# Patient Record
Sex: Male | Born: 1960 | Race: Black or African American | Hispanic: No | Smoking: Never smoker
Health system: Southern US, Community
[De-identification: ages and names within clinical notes are randomized; demographics above are authoritative.]

## PROBLEM LIST (undated history)

## (undated) DIAGNOSIS — K264 Chronic or unspecified duodenal ulcer with hemorrhage: Secondary | ICD-10-CM

## (undated) DIAGNOSIS — D649 Anemia, unspecified: Secondary | ICD-10-CM

## (undated) DIAGNOSIS — I10 Essential (primary) hypertension: Secondary | ICD-10-CM

## (undated) DIAGNOSIS — D132 Benign neoplasm of duodenum: Secondary | ICD-10-CM

## (undated) DIAGNOSIS — K920 Hematemesis: Secondary | ICD-10-CM

## (undated) DIAGNOSIS — G4730 Sleep apnea, unspecified: Secondary | ICD-10-CM

## (undated) DIAGNOSIS — T8859XA Other complications of anesthesia, initial encounter: Secondary | ICD-10-CM

## (undated) DIAGNOSIS — N289 Disorder of kidney and ureter, unspecified: Secondary | ICD-10-CM

## (undated) HISTORY — PX: CHOLECYSTECTOMY: SHX55

## (undated) HISTORY — PX: ABDOMINAL SURGERY: SHX537

## (undated) HISTORY — DX: Hematemesis: K92.0

## (undated) HISTORY — PX: COLON SURGERY: SHX602

## (undated) HISTORY — DX: Chronic or unspecified duodenal ulcer with hemorrhage: K26.4

## (undated) HISTORY — PX: LAPAROSCOPIC GASTRIC SLEEVE RESECTION: SHX5895

---

## 2008-09-25 ENCOUNTER — Ambulatory Visit: Payer: Self-pay | Admitting: Nephrology

## 2015-03-20 DIAGNOSIS — E875 Hyperkalemia: Secondary | ICD-10-CM | POA: Insufficient documentation

## 2015-03-20 DIAGNOSIS — I10 Essential (primary) hypertension: Secondary | ICD-10-CM | POA: Insufficient documentation

## 2015-03-31 DIAGNOSIS — N041 Nephrotic syndrome with focal and segmental glomerular lesions: Secondary | ICD-10-CM | POA: Insufficient documentation

## 2015-04-02 DIAGNOSIS — E611 Iron deficiency: Secondary | ICD-10-CM | POA: Insufficient documentation

## 2015-04-07 DIAGNOSIS — N2581 Secondary hyperparathyroidism of renal origin: Secondary | ICD-10-CM | POA: Insufficient documentation

## 2015-09-30 DIAGNOSIS — D631 Anemia in chronic kidney disease: Secondary | ICD-10-CM | POA: Insufficient documentation

## 2016-05-16 DIAGNOSIS — N186 End stage renal disease: Secondary | ICD-10-CM | POA: Insufficient documentation

## 2016-05-16 DIAGNOSIS — Z992 Dependence on renal dialysis: Secondary | ICD-10-CM | POA: Insufficient documentation

## 2016-06-10 DIAGNOSIS — IMO0001 Reserved for inherently not codable concepts without codable children: Secondary | ICD-10-CM | POA: Insufficient documentation

## 2016-06-30 ENCOUNTER — Inpatient Hospital Stay
Admission: EM | Admit: 2016-06-30 | Discharge: 2016-07-03 | DRG: 377 | Disposition: A | Discharge status: Home / Self Care | Payer: BLUE CROSS/BLUE SHIELD | Attending: Internal Medicine | Admitting: Internal Medicine

## 2016-06-30 ENCOUNTER — Encounter
Admission: EM | Disposition: A | Discharge status: Home / Self Care | Payer: Self-pay | Source: Home / Self Care | Attending: Internal Medicine

## 2016-06-30 ENCOUNTER — Encounter: Payer: Self-pay | Admitting: Emergency Medicine

## 2016-06-30 DIAGNOSIS — I951 Orthostatic hypotension: Secondary | ICD-10-CM | POA: Diagnosis present

## 2016-06-30 DIAGNOSIS — D62 Acute posthemorrhagic anemia: Secondary | ICD-10-CM | POA: Diagnosis present

## 2016-06-30 DIAGNOSIS — K3189 Other diseases of stomach and duodenum: Secondary | ICD-10-CM | POA: Diagnosis present

## 2016-06-30 DIAGNOSIS — D631 Anemia in chronic kidney disease: Secondary | ICD-10-CM | POA: Diagnosis present

## 2016-06-30 DIAGNOSIS — K922 Gastrointestinal hemorrhage, unspecified: Secondary | ICD-10-CM

## 2016-06-30 DIAGNOSIS — Z992 Dependence on renal dialysis: Secondary | ICD-10-CM

## 2016-06-30 DIAGNOSIS — N186 End stage renal disease: Secondary | ICD-10-CM

## 2016-06-30 DIAGNOSIS — Q613 Polycystic kidney, unspecified: Secondary | ICD-10-CM

## 2016-06-30 DIAGNOSIS — K264 Chronic or unspecified duodenal ulcer with hemorrhage: Principal | ICD-10-CM

## 2016-06-30 DIAGNOSIS — I10 Essential (primary) hypertension: Secondary | ICD-10-CM | POA: Diagnosis present

## 2016-06-30 DIAGNOSIS — D649 Anemia, unspecified: Secondary | ICD-10-CM

## 2016-06-30 DIAGNOSIS — E875 Hyperkalemia: Secondary | ICD-10-CM | POA: Diagnosis present

## 2016-06-30 DIAGNOSIS — Z8249 Family history of ischemic heart disease and other diseases of the circulatory system: Secondary | ICD-10-CM

## 2016-06-30 DIAGNOSIS — K921 Melena: Secondary | ICD-10-CM

## 2016-06-30 DIAGNOSIS — D500 Iron deficiency anemia secondary to blood loss (chronic): Secondary | ICD-10-CM

## 2016-06-30 DIAGNOSIS — Z6841 Body Mass Index (BMI) 40.0 and over, adult: Secondary | ICD-10-CM | POA: Diagnosis not present

## 2016-06-30 DIAGNOSIS — N2581 Secondary hyperparathyroidism of renal origin: Secondary | ICD-10-CM | POA: Diagnosis present

## 2016-06-30 DIAGNOSIS — D691 Qualitative platelet defects: Secondary | ICD-10-CM | POA: Diagnosis present

## 2016-06-30 HISTORY — DX: Gastrointestinal hemorrhage, unspecified: K92.2

## 2016-06-30 HISTORY — DX: Benign neoplasm of duodenum: D13.2

## 2016-06-30 HISTORY — DX: Disorder of kidney and ureter, unspecified: N28.9

## 2016-06-30 HISTORY — PX: DIALYSIS/PERMA CATHETER INSERTION: CATH118288

## 2016-06-30 HISTORY — DX: Essential (primary) hypertension: I10

## 2016-06-30 LAB — CBC
HCT: 19.1 % — ABNORMAL LOW (ref 40.0–52.0)
Hemoglobin: 6.7 g/dL — ABNORMAL LOW (ref 13.0–18.0)
MCH: 30.5 pg (ref 26.0–34.0)
MCHC: 34.9 g/dL (ref 32.0–36.0)
MCV: 87.4 fL (ref 80.0–100.0)
PLATELETS: 262 10*3/uL (ref 150–440)
RBC: 2.19 MIL/uL — ABNORMAL LOW (ref 4.40–5.90)
RDW: 15.6 % — AB (ref 11.5–14.5)
WBC: 7.7 10*3/uL (ref 3.8–10.6)

## 2016-06-30 LAB — COMPREHENSIVE METABOLIC PANEL
ALBUMIN: 3.1 g/dL — AB (ref 3.5–5.0)
ALT: 14 U/L — AB (ref 17–63)
AST: 21 U/L (ref 15–41)
Alkaline Phosphatase: 45 U/L (ref 38–126)
Anion gap: 18 — ABNORMAL HIGH (ref 5–15)
BUN: 146 mg/dL — ABNORMAL HIGH (ref 6–20)
CHLORIDE: 100 mmol/L — AB (ref 101–111)
CO2: 20 mmol/L — AB (ref 22–32)
CREATININE: 27.29 mg/dL — AB (ref 0.61–1.24)
Calcium: 6.8 mg/dL — ABNORMAL LOW (ref 8.9–10.3)
GFR calc Af Amer: 2 mL/min — ABNORMAL LOW (ref >60)
GFR calc non Af Amer: 2 mL/min — ABNORMAL LOW (ref >60)
GLUCOSE: 101 mg/dL — AB (ref 65–99)
Potassium: 5.5 mmol/L — ABNORMAL HIGH (ref 3.5–5.1)
SODIUM: 138 mmol/L (ref 135–145)
Total Bilirubin: 0.6 mg/dL (ref 0.3–1.2)
Total Protein: 6.1 g/dL — ABNORMAL LOW (ref 6.5–8.1)

## 2016-06-30 LAB — CBC WITH DIFFERENTIAL/PLATELET
BASOS ABS: 0.1 10*3/uL (ref 0–0.1)
BASOS PCT: 1 %
EOS ABS: 0.3 10*3/uL (ref 0–0.7)
EOS PCT: 4 %
HCT: 13.2 % — CL (ref 40.0–52.0)
HEMOGLOBIN: 4.4 g/dL — AB (ref 13.0–18.0)
Lymphocytes Relative: 16 %
Lymphs Abs: 1.2 10*3/uL (ref 1.0–3.6)
MCH: 29.4 pg (ref 26.0–34.0)
MCHC: 33.3 g/dL (ref 32.0–36.0)
MCV: 88.3 fL (ref 80.0–100.0)
Monocytes Absolute: 0.6 10*3/uL (ref 0.2–1.0)
Monocytes Relative: 8 %
NEUTROS PCT: 71 %
Neutro Abs: 5.6 10*3/uL (ref 1.4–6.5)
PLATELETS: 320 10*3/uL (ref 150–440)
RBC: 1.5 MIL/uL — AB (ref 4.40–5.90)
RDW: 18 % — ABNORMAL HIGH (ref 11.5–14.5)
WBC: 7.9 10*3/uL (ref 3.8–10.6)

## 2016-06-30 LAB — PROTIME-INR
INR: 1.06
PROTHROMBIN TIME: 13.8 s (ref 11.4–15.2)

## 2016-06-30 LAB — HEMOGLOBIN: Hemoglobin: 5.4 g/dL — ABNORMAL LOW (ref 13.0–18.0)

## 2016-06-30 LAB — BASIC METABOLIC PANEL
Anion gap: 10 (ref 5–15)
BUN: 95 mg/dL — AB (ref 6–20)
CALCIUM: 7 mg/dL — AB (ref 8.9–10.3)
CO2: 28 mmol/L (ref 22–32)
Chloride: 101 mmol/L (ref 101–111)
Creatinine, Ser: 18.31 mg/dL — ABNORMAL HIGH (ref 0.61–1.24)
GFR calc Af Amer: 3 mL/min — ABNORMAL LOW (ref >60)
GFR, EST NON AFRICAN AMERICAN: 2 mL/min — AB (ref >60)
GLUCOSE: 96 mg/dL (ref 65–99)
Potassium: 4.1 mmol/L (ref 3.5–5.1)
Sodium: 139 mmol/L (ref 135–145)

## 2016-06-30 LAB — ABO/RH: ABO/RH(D): A POS

## 2016-06-30 LAB — TROPONIN I: Troponin I: 0.1 ng/mL (ref <0.03)

## 2016-06-30 LAB — PREPARE RBC (CROSSMATCH)

## 2016-06-30 LAB — MRSA PCR SCREENING: MRSA by PCR: NEGATIVE

## 2016-06-30 SURGERY — DIALYSIS/PERMA CATHETER INSERTION
Anesthesia: Moderate Sedation

## 2016-06-30 MED ORDER — MIDAZOLAM HCL 2 MG/2ML IJ SOLN
INTRAMUSCULAR | Status: DC | PRN
  Administered 2016-06-30: 2 mg via INTRAVENOUS

## 2016-06-30 MED ORDER — PANTOPRAZOLE SODIUM 40 MG IV SOLR: 40.0000 mg | Freq: Two times a day (BID) | INTRAVENOUS | Status: DC

## 2016-06-30 MED ORDER — LIDOCAINE-EPINEPHRINE (PF) 2 %-1:200000 IJ SOLN
INTRAMUSCULAR | Status: AC
  Filled 2016-06-30: qty 20

## 2016-06-30 MED ORDER — PANTOPRAZOLE SODIUM 40 MG IV SOLR
80.0000 mg | Freq: Once | INTRAVENOUS | Status: AC
  Administered 2016-06-30: 80 mg via INTRAVENOUS
  Filled 2016-06-30: qty 80

## 2016-06-30 MED ORDER — CALCITRIOL 0.25 MCG PO CAPS
0.5000 ug | ORAL_CAPSULE | Freq: Every day | ORAL | Status: DC
  Administered 2016-07-02 – 2016-07-03 (×2): 0.5 ug via ORAL
  Filled 2016-06-30 (×2): qty 2

## 2016-06-30 MED ORDER — FENTANYL CITRATE (PF) 100 MCG/2ML IJ SOLN
INTRAMUSCULAR | Status: DC | PRN
  Administered 2016-06-30: 50 ug via INTRAVENOUS

## 2016-06-30 MED ORDER — ALPRAZOLAM 0.5 MG PO TABS
0.5000 mg | ORAL_TABLET | Freq: Two times a day (BID) | ORAL | Status: DC | PRN
  Administered 2016-07-01: 0.5 mg via ORAL
  Filled 2016-06-30: qty 1

## 2016-06-30 MED ORDER — SODIUM CHLORIDE FLUSH 0.9 % IV SOLN
INTRAVENOUS | Status: AC
  Filled 2016-06-30: qty 30

## 2016-06-30 MED ORDER — CEFAZOLIN IN D5W 1 GM/50ML IV SOLN
1.0000 g | Freq: Once | INTRAVENOUS | Status: AC
  Administered 2016-06-30: 1 g via INTRAVENOUS

## 2016-06-30 MED ORDER — CINACALCET HCL 30 MG PO TABS
60.0000 mg | ORAL_TABLET | Freq: Every day | ORAL | Status: DC
  Administered 2016-07-02 – 2016-07-03 (×2): 60 mg via ORAL
  Filled 2016-06-30 (×2): qty 2

## 2016-06-30 MED ORDER — SODIUM CHLORIDE 0.9 % IV SOLN
8.0000 mg/h | INTRAVENOUS | Status: AC
  Administered 2016-06-30 – 2016-07-03 (×7): 8 mg/h via INTRAVENOUS
  Filled 2016-06-30 (×7): qty 80

## 2016-06-30 MED ORDER — HEPARIN (PORCINE) IN NACL 2-0.9 UNIT/ML-% IJ SOLN
INTRAMUSCULAR | Status: AC
  Filled 2016-06-30: qty 500

## 2016-06-30 MED ORDER — MIDAZOLAM HCL 5 MG/5ML IJ SOLN
INTRAMUSCULAR | Status: AC
  Filled 2016-06-30: qty 5

## 2016-06-30 MED ORDER — HYDROXYZINE HCL 25 MG PO TABS: 100.0000 mg | ORAL_TABLET | Freq: Three times a day (TID) | ORAL | Status: DC | PRN

## 2016-06-30 MED ORDER — SODIUM CHLORIDE 0.9 % IV SOLN
10.0000 mL/h | Freq: Once | INTRAVENOUS | Status: AC
  Administered 2016-06-30: 10 mL/h via INTRAVENOUS

## 2016-06-30 MED ORDER — HEPARIN SODIUM (PORCINE) 1000 UNIT/ML DIALYSIS
1000.0000 [IU] | INTRAMUSCULAR | Status: DC | PRN
  Filled 2016-06-30: qty 1

## 2016-06-30 MED ORDER — SEVELAMER CARBONATE 800 MG PO TABS
1600.0000 mg | ORAL_TABLET | Freq: Three times a day (TID) | ORAL | Status: DC
  Administered 2016-07-02 – 2016-07-03 (×3): 1600 mg via ORAL
  Filled 2016-06-30 (×3): qty 2

## 2016-06-30 MED ORDER — SODIUM CHLORIDE 0.9 % IV SOLN: INTRAVENOUS | Status: DC

## 2016-06-30 MED ORDER — SODIUM CHLORIDE 0.9 % IV SOLN: 80.0000 mg | Freq: Once | INTRAVENOUS | Status: DC

## 2016-06-30 MED ORDER — HEPARIN SODIUM (PORCINE) 10000 UNIT/ML IJ SOLN
INTRAMUSCULAR | Status: AC
  Filled 2016-06-30: qty 1

## 2016-06-30 MED ORDER — HYDROCODONE-ACETAMINOPHEN 5-325 MG PO TABS
1.0000 | ORAL_TABLET | ORAL | Status: DC | PRN
  Administered 2016-06-30 – 2016-07-02 (×4): 1 via ORAL
  Filled 2016-06-30 (×4): qty 1

## 2016-06-30 MED ORDER — FENTANYL CITRATE (PF) 100 MCG/2ML IJ SOLN
INTRAMUSCULAR | Status: AC
  Filled 2016-06-30: qty 2

## 2016-06-30 MED ORDER — MORPHINE SULFATE (PF) 2 MG/ML IV SOLN
1.0000 mg | INTRAVENOUS | Status: DC | PRN
  Administered 2016-06-30 – 2016-07-02 (×7): 1 mg via INTRAVENOUS
  Filled 2016-06-30 (×7): qty 1

## 2016-06-30 MED ORDER — SODIUM CHLORIDE 0.9 % IV BOLUS (SEPSIS)
500.0000 mL | Freq: Once | INTRAVENOUS | Status: AC
  Administered 2016-06-30: 500 mL via INTRAVENOUS

## 2016-06-30 MED ORDER — SODIUM CHLORIDE 0.9% FLUSH
3.0000 mL | Freq: Two times a day (BID) | INTRAVENOUS | Status: DC
  Administered 2016-06-30 – 2016-07-02 (×4): 3 mL via INTRAVENOUS

## 2016-06-30 MED ORDER — DOCUSATE SODIUM 100 MG PO CAPS: 100.0000 mg | ORAL_CAPSULE | Freq: Two times a day (BID) | ORAL | Status: DC | PRN

## 2016-06-30 SURGICAL SUPPLY — 3 items
CATH PALINDROME RT-P 15FX19CM (CATHETERS) ×2 IMPLANT
PACK ANGIOGRAPHY (CUSTOM PROCEDURE TRAY) ×2 IMPLANT
TOWEL OR 17X26 4PK STRL BLUE (TOWEL DISPOSABLE) ×2 IMPLANT

## 2016-06-30 NOTE — ED Notes (Signed)
Patient tolerating blood transfusion well and with no difficulties.  Even and unlabored respirations, no rash present, and VS WDL.  Patient denies any complaints at this time.

## 2016-06-30 NOTE — Progress Notes (Signed)
Post hemodialysis: Tolerated 2.5hours of treatment with no fluid removal per orders.2nd unit of packed red cells completed in dialysis and 3rd unit given,tolerated well no adverse effects noted.Protonix drip remains infusing.Hemodialysis catheter capped/clamped and lumens wrapped with gauze/taped.

## 2016-06-30 NOTE — Progress Notes (Signed)
This note also relates to the following rows which could not be included: Pulse Rate - Cannot attach notes to unvalidated device data ECG Heart Rate - Cannot attach notes to unvalidated device data Resp - Cannot attach notes to unvalidated device data SpO2 - Cannot attach notes to unvalidated device d2nd  Transfusion completed.2nd unit

## 2016-06-30 NOTE — Progress Notes (Signed)
Patient arrived to unit 1655, transferred to dialysis with RN, 2nd unit of blood transfusing at 1720. Patient resting in bed.

## 2016-06-30 NOTE — Progress Notes (Signed)
Hemodialysis treatment started without complications,post right IJ permcath placement.

## 2016-06-30 NOTE — Progress Notes (Signed)
Blood transfusion started in hemodialysis.3rd unit.

## 2016-06-30 NOTE — Progress Notes (Signed)
Pre hd tx.patient arrived via transport and primary nurse with 2nd unit of packed red blood cells infusing without complications.

## 2016-06-30 NOTE — ED Provider Notes (Signed)
[facility] Provider Note   CSN: [id number] Arrival date & time: 06/30/16  1003     History   Chief Complaint Chief Complaint  Patient presents with  . Abnormal Lab  . Weakness    HPI Tristan Bender is a 56 y.o. male hx of CKD, anemia, duodenal polyp here with Anemia, weakness. Patient was recently admitted to [facility] and received 10 units of S fusion and had a colonoscopy that showed a bleeding duodenal polyp that was removed. Patient states that he has been feeling weak all over. He denies any black stools or bright red blood per rectum. He had a follow-up with his primary care doctor and had labs drawn that showed a hemoglobin of 3 and sent here for evaluation. Denies any chest pain, vomiting, or abdominal pain.   The history is provided by the patient.    Past Medical History:  Diagnosis Date  . Renal disorder     There are no active problems to display for this patient.   Past Surgical History:  Procedure Laterality Date  . ABDOMINAL SURGERY    . COLON SURGERY         Home Medications    Prior to Admission medications   Medication Sig Start Date End Date Taking? Authorizing Provider  allopurinol (ZYLOPRIM) 100 MG tablet Take 100 mg by mouth daily. 01/09/16 01/08/17 Yes Historical Provider, MD  ALPRAZolam Duanne Moron) 0.5 MG tablet Take 0.5 mg by mouth at bedtime as needed. 06/24/16  Yes Historical Provider, MD  amLODipine (NORVASC) 5 MG tablet Take 5 mg by mouth daily. 06/15/16 06/15/17 Yes Historical Provider, MD  calcitRIOL (ROCALTROL) 0.5 MCG capsule Take 0.5 mcg by mouth daily. 06/20/16 06/20/17 Yes Historical Provider, MD  Cholecalciferol (VITAMIN D PO) Take 1 tablet by mouth daily.   Yes Historical Provider, MD  cinacalcet (SENSIPAR) 30 MG tablet Take 60 mg by mouth daily. 06/29/16 06/29/17 Yes Historical Provider, MD  hydrOXYzine (ATARAX/VISTARIL) 50 MG tablet Take 100 mg by mouth 3 times/day as needed-between meals & bedtime. 06/23/16  Yes Historical Provider, MD   ondansetron (ZOFRAN) 8 MG tablet Take 8 mg by mouth as needed. 06/21/16  Yes Historical Provider, MD  pantoprazole (PROTONIX) 40 MG tablet Take 40 mg by mouth daily. 06/21/16 06/21/17 Yes Historical Provider, MD  sevelamer carbonate (RENVELA) 800 MG tablet Take 1,600 mg by mouth 3 (three) times daily as needed. 06/16/16  Yes Historical Provider, MD    Family History No family history on file.  Social History Social History  Substance Use Topics  . Smoking status: Not on file  . Smokeless tobacco: Not on file  . Alcohol use Not on file     Allergies   Patient has no known allergies.   Review of Systems Review of Systems  Neurological: Positive for weakness.  All other systems reviewed and are negative.    Physical Exam Updated Vital Signs BP 117/68   Pulse 94   Temp 98 F (36.7 C) (Oral)   Resp 20   Ht 5\' 7"  (1.702 m)   Wt 280 lb (127 kg)   SpO2 99%   BMI 43.85 kg/m   Physical Exam  Constitutional: He is oriented to person, place, and time.  Pale   HENT:  Head: Normocephalic.  OP slightly dry   Eyes: Pupils are equal, round, and reactive to light.  Conjunctiva pale   Neck: Normal range of motion. Neck supple.  Cardiovascular: Regular rhythm and normal heart sounds.   Tachycardic  Pulmonary/Chest: Effort normal and breath sounds normal. No respiratory distress. He has no wheezes.  Abdominal: Soft. Bowel sounds are normal. He exhibits no distension. There is no tenderness. There is no guarding.  Genitourinary:  Genitourinary Comments: occ positive, + melenotic stool   Musculoskeletal: Normal range of motion.  Neurological: He is alert and oriented to person, place, and time. No cranial nerve deficit. Coordination normal.  Skin: Skin is warm.  Psychiatric: He has a normal mood and affect.  Nursing note and vitals reviewed.    ED Treatments / Results  Labs (all labs ordered are listed, but only abnormal results are displayed) Labs Reviewed  CBC WITH  DIFFERENTIAL/PLATELET - Abnormal; Notable for the following:       Result Value   RBC 1.50 (*)    Hemoglobin 4.4 (*)    HCT 13.2 (*)    RDW 18.0 (*)    All other components within normal limits  COMPREHENSIVE METABOLIC PANEL - Abnormal; Notable for the following:    Potassium 5.5 (*)    Chloride 100 (*)    CO2 20 (*)    Glucose, Bld 101 (*)    BUN 146 (*)    Creatinine, Ser 27.29 (*)    Calcium 6.8 (*)    Total Protein 6.1 (*)    Albumin 3.1 (*)    ALT 14 (*)    GFR calc non Af Amer 2 (*)    GFR calc Af Amer 2 (*)    Anion gap 18 (*)    All other components within normal limits  TROPONIN I - Abnormal; Notable for the following:    Troponin I 0.10 (*)    All other components within normal limits  PROTIME-INR  TYPE AND SCREEN  PREPARE RBC (CROSSMATCH)  ABO/RH    EKG  EKG Interpretation None      ED ECG REPORT I, Wandra Arthurs, the attending physician, personally viewed and interpreted this ECG.   Date: 06/30/2016  EKG Time: 10:06 am  Rate: 114  Rhythm: sinus tachycardia  Axis: normal  Intervals:none  ST&T Change: nonspecific   ED ECG REPORT I, Wandra Arthurs, the attending physician, personally viewed and interpreted this ECG.   Date: 06/30/2016  EKG Time:12 :15 pm  Rate: 89  Rhythm: normal EKG, normal sinus rhythm  Axis: normal  Intervals:none  ST&T Change: nonspecific     Radiology No results found.  Procedures Procedures (including critical care time)  CRITICAL CARE Performed by: Wandra Arthurs   Total critical care time: 30 minutes  Critical care time was exclusive of separately billable procedures and treating other patients.  Critical care was necessary to treat or prevent imminent or life-threatening deterioration.  Critical care was time spent personally by me on the following activities: development of treatment plan with patient and/or surrogate as well as nursing, discussions with consultants, evaluation of patient's response to treatment,  examination of patient, obtaining history from patient or surrogate, ordering and performing treatments and interventions, ordering and review of laboratory studies, ordering and review of radiographic studies, pulse oximetry and re-evaluation of patient's condition.   Medications Ordered in ED Medications  pantoprazole (PROTONIX) 80 mg in sodium chloride 0.9 % 250 mL (0.32 mg/mL) infusion (8 mg/hr Intravenous New Bag/Given 06/30/16 1146)  sodium chloride 0.9 % bolus 500 mL (0 mLs Intravenous Stopped 06/30/16 1146)  0.9 %  sodium chloride infusion (10 mL/hr Intravenous New Bag/Given 06/30/16 1054)  pantoprazole (PROTONIX) injection 80 mg (80 mg Intravenous Given  06/30/16 1140)     Initial Impression / Assessment and Plan / ED Course  I have reviewed the triage vital signs and the nursing notes.  Pertinent labs & imaging results that were available during my care of the patient were reviewed by me and considered in my medical decision making (see chart for details).     Tristan Bender is a 57 y.o. male here with anemia. Appears pale. Patient orthostatic by HR criteria. Will order protonix drip, repeat CBC and if low, will transfuse blood.   1:17 PM Patient's Hg is 4.4. Cr. 27. BUN 146, K 5.5. Patient had dialysis yesterday. Called Dr. Candiss Norse to dialyze today. Hg 4.4, transfused 3 U PRBC. Started on protonix drip. Dr. Allen Norris consulted. Hospitalist to admit.    Final Clinical Impressions(s) / ED Diagnoses   Final diagnoses:  None    New Prescriptions New Prescriptions   No medications on file     Drenda Freeze, MD 06/30/16 1321

## 2016-06-30 NOTE — Progress Notes (Signed)
Hemodialysis completed. 

## 2016-06-30 NOTE — Progress Notes (Signed)
Prime Doc notified of pt.'s current Hgb is 6.7. No new orders given at this time. Pt is resting comfortably, will continue to monitor pt.   Tristan Bender [employer]

## 2016-06-30 NOTE — Op Note (Signed)
OPERATIVE NOTE    PRE-OPERATIVE DIAGNOSIS: 1. ESRD 2. Anemia and uremic platelet dysfunction  POST-OPERATIVE DIAGNOSIS: same as above  PROCEDURE: 1. Ultrasound guidance for vascular access to the right internal jugular vein 2. Fluoroscopic guidance for placement of catheter 3. Placement of a 19 cm tip to cuff tunneled hemodialysis catheter via the right internal jugular vein  SURGEON: Leotis Pain, MD  ANESTHESIA:  Local with Moderate conscious sedation for approximately 20 minutes using 2 mg of Versed and 50 mcg of Fentanyl  ESTIMATED BLOOD LOSS: 25 cc  FLUORO TIME: less than one minute  CONTRAST: none  FINDING(S): 1.  Patent right internal jugular vein  SPECIMEN(S):  None  INDICATIONS:   Tristan Bender is a 56 y.o.male who presents with uremic platelet dysfunction and poor dialysis with his PD catheter.  The patient needs long term dialysis access for their ESRD, and a Permcath is necessary.  Risks and benefits are discussed and informed consent is obtained.    DESCRIPTION: After obtaining full informed written consent, the patient was brought back to the vascular suited. The patient's right neck and chest were sterilely prepped and draped in a sterile surgical field was created. Moderate conscious sedation was administered during a face to face encounter with the patient throughout the procedure with my supervision of the RN administering medicines and monitoring the patient's vital signs, pulse oximetry, telemetry and mental status throughout from the start of the procedure until the patient was taken to the recovery room.  The right internal jugular vein was visualized with ultrasound and found to be patent. It was then accessed under direct ultrasound guidance and a permanent image was recorded. A wire was placed. After skin nick and dilatation, the peel-away sheath was placed over the wire. I then turned my attention to an area under the clavicle. Approximately 1-2 fingerbreadths  below the clavicle a small counterincision was created and tunneled from the subclavicular incision to the access site. Using fluoroscopic guidance, a 19 centimeter tip to cuff tunneled hemodialysis catheter was selected, and tunneled from the subclavicular incision to the access site. It was then placed through the peel-away sheath and the peel-away sheath was removed. Using fluoroscopic guidance the catheter tips were parked in the right atrium. The appropriate distal connectors were placed. It withdrew blood well and flushed easily with heparinized saline and a concentrated heparin solution was then placed. It was secured to the chest wall with 2 Prolene sutures. The access incision was closed single 4-0 Monocryl. A 4-0 Monocryl pursestring suture was placed around the exit site. Sterile dressings were placed. The patient tolerated the procedure well and was taken to the recovery room in stable condition.  COMPLICATIONS: None  CONDITION: Stable  Leotis Pain  02/22/2016, 12:56 PM   This note was created with Dragon Medical transcription system. Any errors in dictation are purely unintentional.

## 2016-06-30 NOTE — H&P (Signed)
Tristan Bender at [facility] NAME: Tristan Bender    MR#:  [id number]  DATE OF BIRTH:  07-May-1961  DATE OF ADMISSION:  06/30/2016  PRIMARY CARE PHYSICIAN: No PCP Per Patient   REQUESTING/REFERRING PHYSICIAN: Darl Householder  CHIEF COMPLAINT:   Chief Complaint  Patient presents with  . Abnormal Lab  . Weakness    HISTORY OF PRESENT ILLNESS: Tristan Bender  is a 56 y.o. male with a known history of End-stage renal disease on peritoneal dialysis, hypertension- was admitted in [facility] 2 weeks ago with symptomatic and severe anemia, EGD was done which showed a large duodenal polyp and some ulcer, which was removed and biopsies were sent. He received 10 units of PRBC transfusion during that hospital stay and discharged home with advice to follow in GI clinic in 6 weeks. Also we're planning to switch him to hemodialysis soon. As per patient they told him the biopsy results were negative for cancer, I could not find that in reports from Tristan Bender. Patient had his regular lab work check done by his primary care physician and today he received a call by him saying that his hemoglobin is low again and he needed to go to emergency room right away. He came to emergency room and found to have anemia with hemoglobin up to 4.5 so ER physician ordered 3 units of transfusion and called for medical admission. On further questioning patient denies any abdominal pain or vomiting, he also denies any weakness or fatigue symptoms and says that this is nothing compared to what he was feeling 2-3 weeks ago. He noticed some dark stool but no red blood in his stool.   PAST MEDICAL HISTORY:   Past Medical History:  Diagnosis Date  . 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 Hypertension   . Renal disorder     PAST SURGICAL HISTORY: Past Surgical History:  Procedure Laterality Date  . ABDOMINAL SURGERY    . COLON SURGERY      SOCIAL HISTORY:  Social History  Substance Use Topics  . Smoking  status: Never Smoker  . Smokeless tobacco: Not on file  . Alcohol use No    FAMILY HISTORY:  Family History  Problem Relation Age of Onset  . Renal Disease Maternal Uncle     DRUG ALLERGIES: No Known Allergies  REVIEW OF SYSTEMS:   CONSTITUTIONAL: No fever, fatigue or weakness.  EYES: No blurred or double vision.  EARS, NOSE, AND THROAT: No tinnitus or ear pain.  RESPIRATORY: No cough, shortness of breath, wheezing or hemoptysis.  CARDIOVASCULAR: No chest pain, orthopnea, edema.  GASTROINTESTINAL: No nausea, vomiting, diarrhea or abdominal pain.  GENITOURINARY: No dysuria, hematuria.  ENDOCRINE: No polyuria, nocturia,  HEMATOLOGY: No anemia, easy bruising or bleeding SKIN: No rash or lesion. MUSCULOSKELETAL: No joint pain or arthritis.   NEUROLOGIC: No tingling, numbness, weakness.  PSYCHIATRY: No anxiety or depression.   MEDICATIONS AT HOME:  Prior to Admission medications   Medication Sig Start Date End Date Taking? Authorizing Provider  allopurinol (ZYLOPRIM) 100 MG tablet Take 100 mg by mouth daily. 01/09/16 01/08/17 Yes Historical Provider, MD  ALPRAZolam Duanne Moron) 0.5 MG tablet Take 0.5 mg by mouth at bedtime as needed. 06/24/16  Yes Historical Provider, MD  amLODipine (NORVASC) 5 MG tablet Take 5 mg by mouth daily. 06/15/16 06/15/17 Yes Historical Provider, MD  calcitRIOL (ROCALTROL) 0.5 MCG capsule Take 0.5 mcg by mouth daily. 06/20/16 06/20/17 Yes Historical Provider, MD  Cholecalciferol (  VITAMIN D PO) Take 1 tablet by mouth daily.   Yes Historical Provider, MD  cinacalcet (SENSIPAR) 30 MG tablet Take 60 mg by mouth daily. 06/29/16 06/29/17 Yes Historical Provider, MD  hydrOXYzine (ATARAX/VISTARIL) 50 MG tablet Take 100 mg by mouth 3 times/day as needed-between meals & bedtime. 06/23/16  Yes Historical Provider, MD  ondansetron (ZOFRAN) 8 MG tablet Take 8 mg by mouth as needed. 06/21/16  Yes Historical Provider, MD  pantoprazole (PROTONIX) 40 MG tablet Take 40 mg by mouth daily. 06/21/16  06/21/17 Yes Historical Provider, MD  sevelamer carbonate (RENVELA) 800 MG tablet Take 1,600 mg by mouth 3 (three) times daily as needed. 06/16/16  Yes Historical Provider, MD      PHYSICAL EXAMINATION:   VITAL SIGNS: Blood pressure 118/72, pulse 92, temperature 98.8 F (37.1 C), temperature source Oral, resp. rate (!) 23, height 5\' 7"  (1.702 m), weight 127 kg (280 lb), SpO2 100 %.  GENERAL:  56 y.o.-year-old patient lying in the bed with no acute distress.  EYES: Pupils equal, round, reactive to light and accommodation. No scleral icterus. Extraocular muscles intact.  HEENT: Head atraumatic, normocephalic. Oropharynx and nasopharynx clear. Conjunctiva pale . NECK:  Supple, no jugular venous distention. No thyroid enlargement, no tenderness.  LUNGS: Normal breath sounds bilaterally, no wheezing, rales,rhonchi or crepitation. No use of accessory muscles of respiration.  CARDIOVASCULAR: S1, S2 normal. No murmurs, rubs, or gallops.  ABDOMEN: Soft, nontender, nondistended. Bowel sounds present. No organomegaly or mass.  EXTREMITIES: No pedal edema, cyanosis, or clubbing.  NEUROLOGIC: Cranial nerves II through XII are intact. Muscle strength 5/5 in all extremities. Sensation intact. Gait not checked.  PSYCHIATRIC: The patient is alert and oriented x 3.  SKIN: No obvious rash, lesion, or ulcer.   LABORATORY PANEL:   CBC  Recent Labs Lab 06/30/16 1021  WBC 7.9  HGB 4.4*  HCT 13.2*  PLT 320  MCV 88.3  MCH 29.4  MCHC 33.3  RDW 18.0*  LYMPHSABS 1.2  MONOABS 0.6  EOSABS 0.3  BASOSABS 0.1   ------------------------------------------------------------------------------------------------------------------  Chemistries   Recent Labs Lab 06/30/16 1021  NA 138  K 5.5*  CL 100*  CO2 20*  GLUCOSE 101*  BUN 146*  CREATININE 27.29*  CALCIUM 6.8*  AST 21  ALT 14*  ALKPHOS 45  BILITOT 0.6    ------------------------------------------------------------------------------------------------------------------ estimated creatinine clearance is 3.9 mL/min (by C-G formula based on SCr of 27.29 mg/dL (H)). ------------------------------------------------------------------------------------------------------------------ No results for input(s): TSH, T4TOTAL, T3FREE, THYROIDAB in the last 72 hours.  Invalid input(s): FREET3   Coagulation profile  Recent Labs Lab 06/30/16 1021  INR 1.06   ------------------------------------------------------------------------------------------------------------------- No results for input(s): DDIMER in the last 72 hours. -------------------------------------------------------------------------------------------------------------------  Cardiac Enzymes  Recent Labs Lab 06/30/16 1021  TROPONINI 0.10*   ------------------------------------------------------------------------------------------------------------------ Invalid input(s): POCBNP  ---------------------------------------------------------------------------------------------------------------  Urinalysis No results found for: COLORURINE, APPEARANCEUR, LABSPEC, PHURINE, GLUCOSEU, HGBUR, BILIRUBINUR, KETONESUR, PROTEINUR, UROBILINOGEN, NITRITE, LEUKOCYTESUR   RADIOLOGY: No results found.  EKG: Orders placed or performed during the hospital encounter of 06/30/16  . EKG 12-Lead  . EKG 12-Lead  . EKG 12-Lead  . EKG 12-Lead  . EKG 12-Lead  . EKG 12-Lead  . EKG 12-Lead  . EKG 12-Lead    IMPRESSION AND PLAN:  * Symptomatically anemia, GI bleed   Recent admission to [facility] with 10 units of blood transfusion and finding of a duodenal polyp which patient claims to be noncancerous.   Keep nothing by mouth, IV fluids, 3 units of blood transfusion ordered  by ER physician, Protonix IV twice a day.   GI consult called in. I spoke to on call doctor.  * End-stage renal disease on  Pletal dialysis   Patient's electrolytes are out of control, nephrology saw him and planning to switch him to hemodialysis. He will get a permacath in.  * Hypertension   Currently patient is running orthostatic hypotension and blood pressure is borderline.   I will hold hypertensive medications and keep him monitoring.  All the records are reviewed and case discussed with ED provider. Management plans discussed with the patient, family and they are in agreement.  CODE STATUS: full. Code Status History    This patient does not have a recorded code status. Please follow your organizational policy for patients in this situation.     Discussed the case with nephrologist in GI doctor.  TOTAL TIME TAKING CARE OF THIS PATIENT: 50 minutes.    Vaughan Basta M.D on 06/30/2016   Between 7am to 6pm - Pager - [phone]  After 6pm go to [email or web address] - password [email or web address] [facility]  Office  [phone]  CC: Primary care physician; No PCP Per Patient   Note: This dictation was prepared with Dragon dictation along with smaller phrase technology. Any transcriptional errors that result from this process are unintentional.

## 2016-06-30 NOTE — Progress Notes (Signed)
Dr. Allen Norris spoke to [facility] - Dr. Jeannett Senior, she accepted pt for their care as he had procedure there. He also spoke to Hospitalist Dr. Darl Householder at [facility], pt is accepted but , it will take 2-3 days as they are full.  Meanwhile Dr. Allen Norris will do endoscopy here tomorrow.

## 2016-06-30 NOTE — ED Triage Notes (Signed)
Patient presents to the ED via [facility] after his doctor called him and told him to go the ED for a hemoglobin of 3.  Patient is complaining of some weakness.  Patient reports 2 weeks ago they found he had a bleeding tumor that was clamped.  Patient is a nightly dialysis patient who self-dialyzes.  Patient is in no obvious distress at this time.  Denies pain.  Denies blood in stool.  Reports somewhat dark stools.  Denies chest pain and shortness of breath.

## 2016-06-30 NOTE — Consult Note (Signed)
Date: 06/30/2016                  Patient Name:  Tristan Bender  MRN: [id number]  DOB: 07-19-1960  Age / Sex: 56 y.o., male         PCP: No PCP Per Patient                 Service Requesting Consult: Internal medicine / ER                 Reason for Consult: Elevated serum creatinine            History of Present Illness: Patient is a 56 y.o. male with a history of chronic anemia, HTN, ESRD secondary to polycystic kidney disease currently on PD, who was admitted to [facility] on 06/30/2016 for evaluation of severe anemia and uremia. Patient was see by primary nephrologist and sent to [facility] ED, there he was  found to have a Hgb of 4.4 and S Cr of 27.29. PD prescription currently 4 exchanges at night and 3 exchanges during the day. Had a recent admission to [facility](06/06/16-06/15/16) for anemia and found to have a large ulcerative duodenal polyp that was removed. He received 10 units of PRBC during that stay. Discharged with plans to convert to HD.   He normally pulls 1 liter of fluid every night with PD. He currently denies any nausea, vomiting, or decrease appetite. No bleeding, hematuria, hematemesis, or melena.  Endorses that he make some urine.    Medications: Outpatient medications:  (Not in a hospital admission)  Current medications: Current Facility-Administered Medications  Medication Dose Route Frequency Provider Last Rate Last Dose  . pantoprazole (PROTONIX) 80 mg in sodium chloride 0.9 % 250 mL (0.32 mg/mL) infusion  8 mg/hr Intravenous Continuous Drenda Freeze, MD 25 mL/hr at 06/30/16 1146 8 mg/hr at 06/30/16 1146   Current Outpatient Prescriptions  Medication Sig Dispense Refill  . allopurinol (ZYLOPRIM) 100 MG tablet Take 100 mg by mouth daily.    Marland Kitchen ALPRAZolam (XANAX) 0.5 MG tablet Take 0.5 mg by mouth at bedtime as needed.  0  . amLODipine (NORVASC) 5 MG tablet Take 5 mg by mouth daily.    . calcitRIOL (ROCALTROL) 0.5 MCG capsule Take 0.5 mcg by mouth daily.    .  Cholecalciferol (VITAMIN D PO) Take 1 tablet by mouth daily.    . cinacalcet (SENSIPAR) 30 MG tablet Take 60 mg by mouth daily.    . hydrOXYzine (ATARAX/VISTARIL) 50 MG tablet Take 100 mg by mouth 3 times/day as needed-between meals & bedtime.  0  . ondansetron (ZOFRAN) 8 MG tablet Take 8 mg by mouth as needed.  0  . pantoprazole (PROTONIX) 40 MG tablet Take 40 mg by mouth daily.    . sevelamer carbonate (RENVELA) 800 MG tablet Take 1,600 mg by mouth 3 (three) times daily as needed.  2      Allergies: No Known Allergies    Past Medical History: Past Medical History:  Diagnosis Date  . Renal disorder      Past Surgical History: Past Surgical History:  Procedure Laterality Date  . ABDOMINAL SURGERY    . COLON SURGERY       Family History: No family history on file.   Social History: Social History   Social History  . Marital status: Divorced    Spouse name: N/A  . Number of children: N/A  . Years of education: N/A   Occupational History  . Not  on file.   Social History Main Topics  . Smoking status: Not on file  . Smokeless tobacco: Not on file  . Alcohol use Not on file  . Drug use: Unknown  . Sexual activity: Not on file   Other Topics Concern  . Not on file   Social History Narrative  . No narrative on file     Review of Systems: Gen: fatigue.  HEENT: no complaints CV: Denies any heart problems. No chest pain. Mild leg swelling. Resp: no shortness of breath. GI:No nausea, vomiting, hematemesis.  GU : Denies hematuria. Makes some urine MS: no compliants Derm: no rashes Psych: no compliants Heme: History of anemia Neuro: no compliants Endocrine no compliants  Vital Signs: Blood pressure 118/72, pulse 92, temperature 98.8 F (37.1 C), temperature source Oral, resp. rate (!) 23, height 5\' 7"  (1.702 m), weight 127 kg (280 lb), SpO2 100 %.   Intake/Output Summary (Last 24 hours) at 06/30/16 1414 Last data filed at 06/30/16 1320  Gross per 24  hour  Intake             1100 ml  Output                0 ml  Net             1100 ml    Weight trends: Filed Weights   06/30/16 1009  Weight: 127 kg (280 lb)    Physical Exam: General:  laying in bed in NAD.  HEENT Oral mucosa is moist.  Tongue pale. palpebrae pallor.  Neck: Supple   Lungs: Clear to auscultation bilaterally   Heart::  regular rhythm, tachycardia.  Abdomen: Soft, nontender, bowel sounds intact.  Extremities:  +1 pitting edema in lower legs  Neurologic: Alert and oriented. Responds to commands and questions appropriately   Skin: No rashes  Access: PD catheter site is intact with out drainage or erythremia.           Lab results: Basic Metabolic Panel:  Recent Labs Lab 06/30/16 1021  NA 138  K 5.5*  CL 100*  CO2 20*  GLUCOSE 101*  BUN 146*  CREATININE 27.29*  CALCIUM 6.8*    Liver Function Tests:  Recent Labs Lab 06/30/16 1021  AST 21  ALT 14*  ALKPHOS 45  BILITOT 0.6  PROT 6.1*  ALBUMIN 3.1*   No results for input(s): LIPASE, AMYLASE in the last 168 hours. No results for input(s): AMMONIA in the last 168 hours.  CBC:  Recent Labs Lab 06/30/16 1021  WBC 7.9  NEUTROABS 5.6  HGB 4.4*  HCT 13.2*  MCV 88.3  PLT 320    Cardiac Enzymes:  Recent Labs Lab 06/30/16 1021  TROPONINI 0.10*    BNP: Invalid input(s): POCBNP  CBG: No results for input(s): GLUCAP in the last 168 hours.  Microbiology: No results found for this or any previous visit (from the past 720 hour(s)).   Coagulation Studies:  Recent Labs  06/30/16 1021  LABPROT 13.8  INR 1.06    Urinalysis: No results for input(s): COLORURINE, LABSPEC, PHURINE, GLUCOSEU, HGBUR, BILIRUBINUR, KETONESUR, PROTEINUR, UROBILINOGEN, NITRITE, LEUKOCYTESUR in the last 72 hours.  Invalid input(s): APPERANCEUR      Imaging:  No results found.   Assessment & Plan: Pt is a 56 y.o. AA  male with a history of chronic anemia, HTN, ESRD secondary to polycystic kidney  disease currently on PD, who was admitted to [facility] on 06/30/2016 for evaluation of severe anemia and uremia.  Patient was see by primary and sent to [facility] ED, there he was  found to have a Hgb of 4.4 and S Cr of 27.29. PD prescription currently 4 exchanges at night and 3 exchanges during the day. Had a recent admission to [facility](06/06/16-06/15/16) for anemia and found to have a large ulcerative duodenal polyp that was removed. He received 10 units of PRBC during that stay. Discharged with plans to convert to HD.   1. 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 -Currently on PD with exchanges all day and night. -S. Cr is 27.29 today - Suspect peritoneal membrane failure as he is not responding to current treatments and appears to be poorly dialyzed - Vascular to place perma cath tentatively today - Once cath is placed, patient is to receive hemo-dialysis.   2. Anemia of CKD and blood loss;  hemoglobin today is 4.4. Getting 3 U PRBC today.  3. SHPTH - monitor phosphorus level  4. Hyperkalemia - Expected to correct with HD

## 2016-06-30 NOTE — ED Notes (Signed)
Per Dr. Candiss Norse, patient needs to remain NPO until we know whether or not Dr. Lucky Cowboy would be able to place a perma-cath today for dialysis so that patient can have hemodialysis.

## 2016-06-30 NOTE — Progress Notes (Signed)
Post hd tx 

## 2016-06-30 NOTE — Progress Notes (Signed)
Pre-hd tx 

## 2016-06-30 NOTE — Consult Note (Signed)
[facility] Consult Note  MRN : [id number]  Tristan Bender is a 56 y.o. (1960/09/17) male who presents with chief complaint of  Chief Complaint  Patient presents with  . Abnormal Lab  . Weakness  .  History of Present Illness: I am asked to see the patient by Dr. Candiss Norse from nephrology for evaluation for dialysis catheter placement. The patient has end-stage renal disease and has been maintained on peritoneal dialysis at an outside institution. He has had one admission for severe anemia and is returning to our institution today for her severe anemia and GI bleeding. His hemoglobin is 4.4. He has artery received 2 units of packed red blood cells. He was found to have a markedly elevated BUN and creatinine on admission despite having gotten peritoneal dialysis regularly. He has some swelling but no severe volume overload symptoms. His renal platelet dysfunction is contributing to his ongoing bleeding. He feels reasonably well other than having some fatigue and lethargy. He denies fevers or chills. He denies chest pain.   Current Facility-Administered Medications  Medication Dose Route Frequency Provider Last Rate Last Dose  . 0.9 %  sodium chloride infusion   Intravenous Continuous Algernon Huxley, MD      . ceFAZolin (ANCEF) IVPB 1 g/50 mL premix  1 g Intravenous Once Algernon Huxley, MD      . pantoprazole (PROTONIX) 80 mg in sodium chloride 0.9 % 250 mL (0.32 mg/mL) infusion  8 mg/hr Intravenous Continuous Drenda Freeze, MD 25 mL/hr at 06/30/16 1146 8 mg/hr at 06/30/16 1146    Past Medical History:  Diagnosis Date  . Adenomatous duodenal polyp    jan 2018[facility]  . Hypertension   . Renal disorder     Past Surgical History:  Procedure Laterality Date  . ABDOMINAL SURGERY    . COLON SURGERY      Social History Social History  Substance Use Topics  . Smoking status: Never Smoker  . Smokeless tobacco: Never Used  . Alcohol use No  No IV drug  use  Family History Family History  Problem Relation Age of Onset  . Renal Disease Maternal Uncle   No bleeding disorders, clotting disorders, aneurysms, or porphyria  No Known Allergies   REVIEW OF SYSTEMS (Negative unless checked)  Constitutional: [] Weight loss  [] Fever  [] Chills Cardiac: [] Chest pain   [] Chest pressure   [] Palpitations   [] Shortness of breath when laying flat   [] Shortness of breath at rest   [x] Shortness of breath with exertion. Vascular:  [] Pain in legs with walking   [] Pain in legs at rest   [] Pain in legs when laying flat   [] Claudication   [] Pain in feet when walking  [] Pain in feet at rest  [] Pain in feet when laying flat   [] History of DVT   [] Phlebitis   [x] Swelling in legs   [] Varicose veins   [] Non-healing ulcers Pulmonary:   [] Uses home oxygen   [] Productive cough   [] Hemoptysis   [] Wheeze  [] COPD   [] Asthma Neurologic:  [] Dizziness  [] Blackouts   [] Seizures   [] History of stroke   [] History of TIA  [] Aphasia   [] Temporary blindness   [] Dysphagia   [] Weakness or numbness in arms   [] Weakness or numbness in legs Musculoskeletal:  [] Arthritis   [] Joint swelling   [] Joint pain   [] Low back pain Hematologic:  [] Easy bruising  [] Easy bleeding   [] Hypercoagulable state   [x] Anemic  [] Hepatitis Gastrointestinal:  [x] Blood in stool   []   Vomiting blood  [] Gastroesophageal reflux/heartburn   [] Difficulty swallowing. Genitourinary:  [] Chronic kidney disease   [] Difficult urination  [] Frequent urination  [] Burning with urination   [] Blood in urine Skin:  [] Rashes   [] Ulcers   [] Wounds Psychological:  [] History of anxiety   []  History of major depression.  Physical Examination  Vitals:   06/30/16 1445 06/30/16 1500 06/30/16 1507 06/30/16 1512  BP: (!) 135/95  140/84   Pulse: 98  93   Resp: (!) 22 16 11    Temp:   99.5 F (37.5 C) 99.5 F (37.5 C)  TempSrc:    Oral  SpO2:   100%   Weight:      Height:       Body mass index is 43.85 kg/m. Gen:  WD/WN, NAD.  Obese male lying comfortably in his bed Head: [place]/AT, No temporalis wasting. Prominent temp pulse not noted. Ear/Nose/Throat: Hearing grossly intact, nares w/o erythema or drainage, oropharynx w/o Erythema/Exudate Eyes: Sclera non-icteric, conjunctiva clear Neck: Trachea midline.  No JVD.  Pulmonary:  Good air movement, respirations not labored, equal bilaterally.  Cardiac: RRR, normal S1, S2. Vascular:  Vessel Right Left  Radial Palpable Palpable                                   Gastrointestinal: soft, non-tender/non-distended. Peritoneal dialysis catheter in place Musculoskeletal: M/S 5/5 throughout.  Extremities without ischemic changes.  No deformity or atrophy. No edema. Neurologic: Sensation grossly intact in extremities.  Symmetrical.  Speech is fluent. Motor exam as listed above. Psychiatric: Judgment intact, Mood & affect appropriate for pt's clinical situation. Dermatologic: No rashes or ulcers noted.  No cellulitis or open wounds. Lymph : No Cervical, Axillary, or Inguinal lymphadenopathy.      CBC Lab Results  Component Value Date   WBC 7.9 06/30/2016   HGB 4.4 (LL) 06/30/2016   HCT 13.2 (LL) 06/30/2016   MCV 88.3 06/30/2016   PLT 320 06/30/2016    BMET    Component Value Date/Time   NA 138 06/30/2016 1021   K 5.5 (H) 06/30/2016 1021   CL 100 (L) 06/30/2016 1021   CO2 20 (L) 06/30/2016 1021   GLUCOSE 101 (H) 06/30/2016 1021   BUN 146 (H) 06/30/2016 1021   CREATININE 27.29 (H) 06/30/2016 1021   CALCIUM 6.8 (L) 06/30/2016 1021   GFRNONAA 2 (L) 06/30/2016 1021   GFRAA 2 (L) 06/30/2016 1021   Estimated Creatinine Clearance: 3.9 mL/min (by C-G formula based on SCr of 27.29 mg/dL (H)).  COAG Lab Results  Component Value Date   INR 1.06 06/30/2016    Radiology No results found.    Assessment/Plan 1. ESRD with inadequate peritoneal dialysis.  We are asked to place a PermCath for hemodialysis and this will be done today. Risks and benefits of the  procedure were discussed with patient in detail and he received. 2. Anemia. Severe. From GI blood loss somewhat slowly over time. His severe uremic platelet dysfunction from markedly elevated BUN would be a major contributing factor to this, and good dialysis should help that. He has artery gotten 2 units of packed red blood cells today. 3. Uremic platelet dysfunction with GI bleeding. See #2. 4. Hypertension. On outpatient medications and blood pressure control important in reducing the progression of atherosclerotic disease. On appropriate oral medications.    Leotis Pain, MD  06/30/2016 3:27 PM    This note was created with Dragon medical  transcription system.  Any error is purely unintentional

## 2016-07-01 ENCOUNTER — Encounter: Payer: Self-pay | Admitting: Vascular Surgery

## 2016-07-01 ENCOUNTER — Inpatient Hospital Stay: Payer: BLUE CROSS/BLUE SHIELD | Admitting: Anesthesiology

## 2016-07-01 ENCOUNTER — Encounter
Admission: EM | Disposition: A | Discharge status: Home / Self Care | Payer: Self-pay | Source: Home / Self Care | Attending: Internal Medicine

## 2016-07-01 DIAGNOSIS — K264 Chronic or unspecified duodenal ulcer with hemorrhage: Secondary | ICD-10-CM

## 2016-07-01 DIAGNOSIS — D500 Iron deficiency anemia secondary to blood loss (chronic): Secondary | ICD-10-CM

## 2016-07-01 DIAGNOSIS — K3189 Other diseases of stomach and duodenum: Secondary | ICD-10-CM

## 2016-07-01 HISTORY — PX: ESOPHAGOGASTRODUODENOSCOPY (EGD) WITH PROPOFOL: SHX5813

## 2016-07-01 LAB — CBC
HEMATOCRIT: 17.8 % — AB (ref 40.0–52.0)
Hemoglobin: 6.4 g/dL — ABNORMAL LOW (ref 13.0–18.0)
MCH: 30.8 pg (ref 26.0–34.0)
MCHC: 35.7 g/dL (ref 32.0–36.0)
MCV: 86.3 fL (ref 80.0–100.0)
PLATELETS: 229 10*3/uL (ref 150–440)
RBC: 2.06 MIL/uL — ABNORMAL LOW (ref 4.40–5.90)
RDW: 15.9 % — AB (ref 11.5–14.5)
WBC: 5.8 10*3/uL (ref 3.8–10.6)

## 2016-07-01 LAB — GLUCOSE, CAPILLARY
Glucose-Capillary: 95 mg/dL (ref 65–99)
Glucose-Capillary: 69 mg/dL (ref 65–99)

## 2016-07-01 LAB — BASIC METABOLIC PANEL
Anion gap: 11 (ref 5–15)
BUN: 96 mg/dL — ABNORMAL HIGH (ref 6–20)
CO2: 27 mmol/L (ref 22–32)
CREATININE: 19.12 mg/dL — AB (ref 0.61–1.24)
Calcium: 6.7 mg/dL — ABNORMAL LOW (ref 8.9–10.3)
Chloride: 102 mmol/L (ref 101–111)
GFR calc Af Amer: 3 mL/min — ABNORMAL LOW (ref >60)
GFR, EST NON AFRICAN AMERICAN: 2 mL/min — AB (ref >60)
GLUCOSE: 86 mg/dL (ref 65–99)
POTASSIUM: 4.9 mmol/L (ref 3.5–5.1)
Sodium: 140 mmol/L (ref 135–145)

## 2016-07-01 LAB — HEMOGLOBIN
HEMOGLOBIN: 7 g/dL — AB (ref 13.0–18.0)
Hemoglobin: 8.1 g/dL — ABNORMAL LOW (ref 13.0–18.0)

## 2016-07-01 LAB — PREPARE RBC (CROSSMATCH)

## 2016-07-01 SURGERY — ESOPHAGOGASTRODUODENOSCOPY (EGD) WITH PROPOFOL
Anesthesia: General

## 2016-07-01 MED ORDER — SODIUM CHLORIDE 0.9 % IV SOLN
Freq: Once | INTRAVENOUS | Status: AC
  Administered 2016-07-01: 14:00:00 via INTRAVENOUS

## 2016-07-01 MED ORDER — HYDROMORPHONE HCL 1 MG/ML IJ SOLN
0.5000 mg | Freq: Once | INTRAMUSCULAR | Status: AC
  Administered 2016-07-01: 0.5 mg via INTRAVENOUS
  Filled 2016-07-01: qty 0.5

## 2016-07-01 MED ORDER — LIDOCAINE HCL (PF) 2 % IJ SOLN
INTRAMUSCULAR | Status: AC
  Filled 2016-07-01: qty 2

## 2016-07-01 MED ORDER — SODIUM CHLORIDE 0.9 % IV SOLN: INTRAVENOUS | Status: DC

## 2016-07-01 MED ORDER — PROPOFOL 500 MG/50ML IV EMUL
INTRAVENOUS | Status: DC | PRN
  Administered 2016-07-01: 140 ug/kg/min via INTRAVENOUS

## 2016-07-01 MED ORDER — PROPOFOL 500 MG/50ML IV EMUL
INTRAVENOUS | Status: AC
  Filled 2016-07-01: qty 50

## 2016-07-01 MED ORDER — PROPOFOL 10 MG/ML IV BOLUS
INTRAVENOUS | Status: DC | PRN
  Administered 2016-07-01: 60 mg via INTRAVENOUS

## 2016-07-01 NOTE — Progress Notes (Signed)
Pre HD  

## 2016-07-01 NOTE — Plan of Care (Signed)
Problem: Pain Managment: Goal: General experience of comfort will improve Outcome: Not Progressing Pt is not receiving pain relief with PRN medication. Prime doc was notified of no relief, order for one time dose of 0.5mg  of dilaudid.

## 2016-07-01 NOTE — Progress Notes (Signed)
Pt has home CPAP plugged into red outlet. No signs of damage to CPAP

## 2016-07-01 NOTE — Progress Notes (Signed)
POST DIALYSIS ASSESSMENT 

## 2016-07-01 NOTE — Progress Notes (Signed)
HD initiated without issue via R Chest HD catheter. Patient is to receive 2 units PRBCs on HD per order. No heparin tx. Continue to monitor.

## 2016-07-01 NOTE — Progress Notes (Addendum)
Prime Dr.  Ree Kida of pt.'s most recent hgb is 6.4. Parameter orders states to notified MD if hgb is less than 7 after lab draw that is Q8 No new orders given . Pt is asleep,VSS. Will continue to monitor pt closely.  Jefry Lesinski [employer]

## 2016-07-01 NOTE — Anesthesia Post-op Follow-up Note (Cosign Needed)
Anesthesia QCDR form completed.        

## 2016-07-01 NOTE — Progress Notes (Signed)
Subjective:   Patient seen during dialysis Tolerating well  States he feels much better today   HEMODIALYSIS FLOWSHEET:  Blood Flow Rate (mL/min): 250 mL/min Arterial Pressure (mmHg): -80 mmHg Venous Pressure (mmHg): 60 mmHg Transmembrane Pressure (mmHg): 40 mmHg Ultrafiltration Rate (mL/min): 170 mL/min Dialysate Flow Rate (mL/min): 500 ml/min Conductivity: Machine : 14.1 Conductivity: Machine : 14.1 Dialysis Fluid Bolus: Normal Saline Bolus Amount (mL): 250 mL Dialysate Change:  (3k 2.5ca) Intra-Hemodialysis Comments: 250cc prime, intiated via R Chest HD catheter without issue, no heparin tx. patient is to recieve 2 units prbcs on HD today per order.      Objective:  Vital signs in last 24 hours:  Temp:  [97.8 F (36.6 C)-99.5 F (37.5 C)] 98.2 F (36.8 C) (02/16 0934) Pulse Rate:  [77-133] 88 (02/16 0934) Resp:  [11-32] 20 (02/16 0934) BP: (117-153)/(68-95) 148/93 (02/16 0934) SpO2:  [93 %-100 %] 100 % (02/16 0934) Weight:  [127 kg (279 lb 15.8 oz)-133.5 kg (294 lb 5 oz)] 133.5 kg (294 lb 5 oz) (02/16 0934)  Weight change:  Filed Weights   06/30/16 1443 06/30/16 1710 07/01/16 0934  Weight: 127 kg (280 lb) 127 kg (279 lb 15.8 oz) 133.5 kg (294 lb 5 oz)    Intake/Output:    Intake/Output Summary (Last 24 hours) at 07/01/16 1017 Last data filed at 07/01/16 0817  Gross per 24 hour  Intake             2037 ml  Output              725 ml  Net             1312 ml     Physical Exam: General: NAD, laying in bed  HEENT anicteric  Neck supple  Pulm/lungs Clear b/l  CVS/Heart irregular  Abdomen:  Soft, NT  Extremities: trace edema  Neurologic: Alert, oriented  Skin: No acute rashes  Access Rt IJ PC, PD catheter       Basic Metabolic Panel:   Recent Labs Lab 06/30/16 1021 06/30/16 2033 07/01/16 0202  NA 138 139 140  K 5.5* 4.1 4.9  CL 100* 101 102  CO2 20* 28 27  GLUCOSE 101* 96 86  BUN 146* 95* 96*  CREATININE 27.29* 18.31* 19.12*  CALCIUM  6.8* 7.0* 6.7*     CBC:  Recent Labs Lab 06/30/16 1021 06/30/16 1821 06/30/16 2033 07/01/16 0202  WBC 7.9  --  7.7 5.8  NEUTROABS 5.6  --   --   --   HGB 4.4* 5.4* 6.7* 6.4*  HCT 13.2*  --  19.1* 17.8*  MCV 88.3  --  87.4 86.3  PLT 320  --  262 229      Microbiology:  Recent Results (from the past 720 hour(s))  MRSA PCR Screening     Status: None   Collection Time: 06/30/16  9:42 PM  Result Value Ref Range Status   MRSA by PCR NEGATIVE NEGATIVE Final    Comment:        The GeneXpert MRSA Assay (FDA approved for NASAL specimens only), is one component of a comprehensive MRSA colonization surveillance program. It is not intended to diagnose MRSA infection nor to guide or monitor treatment for MRSA infections.     Coagulation Studies:  Recent Labs  06/30/16 1021  LABPROT 13.8  INR 1.06    Urinalysis: No results for input(s): COLORURINE, LABSPEC, PHURINE, GLUCOSEU, HGBUR, BILIRUBINUR, KETONESUR, PROTEINUR, UROBILINOGEN, NITRITE, LEUKOCYTESUR in the last 72 hours.  Invalid input(s): APPERANCEUR    Imaging: No results found.   Medications:   . pantoprozole (PROTONIX) infusion 8 mg/hr (06/30/16 2249)   . sodium chloride   Intravenous Once  . calcitRIOL  0.5 mcg Oral Daily  . cinacalcet  60 mg Oral Q breakfast  . sevelamer carbonate  1,600 mg Oral TID WC  . 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 Patient was see by primary and sent to [facility] ED, there he was  found to have a Hgb of 4.4 and S Cr of 27.29. PD prescription currently 4 exchanges at night and 3 exchanges during the day. Had a recent admission to [facility](06/06/16-06/15/16) for anemia and  found to have a large ulcerative duodenal polyp that was removed. He received 10 units of PRBC during that stay.    1. 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 -Currently on PD with exchanges all day and night. - Suspect peritoneal membrane failure as he is not responding to current treatments and appears to be poorly dialyzed and uremic - had one treatment of HD yesterday and another treatment today - will likely give another dialysis tomorrow   2. Anemia of CKD and blood loss;  Multiple units of blood transfused Uremic platelet dysfunction probably contributing GI evaluation is in progress  3. SHPTH - monitor phosphorus level  4. Hyperkalemia -Corrected with HD    LOS: 1 Kinzleigh Kandler 2/16/201810:17 AM

## 2016-07-01 NOTE — Transfer of Care (Signed)
Immediate Anesthesia Transfer of Care Note  Patient: Tristan Bender  Procedure(s) Performed: Procedure(s): ESOPHAGOGASTRODUODENOSCOPY (EGD) WITH PROPOFOL (N/A)  Patient Location: PACU  Anesthesia Type:General  Level of Consciousness: sedated  Airway & Oxygen Therapy: Patient Spontanous Breathing and Patient connected to nasal cannula oxygen  Post-op Assessment: Report given to RN and Post -op Vital signs reviewed and stable  Post vital signs: Reviewed and stable  Last Vitals:  Vitals:   07/01/16 1333 07/01/16 1413  BP: (!) 127/57   Pulse: 84   Resp: 20   Temp: 37.3 C 37.6 C    Last Pain:  Vitals:   07/01/16 1413  TempSrc: Tympanic  PainSc:       Patients Stated Pain Goal: 0 (95/09/32 6712)  Complications: No apparent anesthesia complications

## 2016-07-01 NOTE — Op Note (Signed)
[facility] Patient Name: Tristan Bender Procedure Date: 07/01/2016 1:50 PM MRN: [id number] Account #: [id number] Date of Birth: 03-25-1961 Admit Type: Inpatient Age: 56 Room: [facility] ENDO ROOM 4 Gender: Male Note Status: Finalized Procedure:            Upper GI endoscopy Indications:          Iron deficiency anemia Providers:            Lucilla Lame MD, MD Referring MD:         Andrez Grime. Mottl, MD (Referring MD) Medicines:            Propofol per Anesthesia Complications:        No immediate complications. Procedure:            Pre-Anesthesia Assessment:                       - Prior to the procedure, a History and Physical was                        performed, and patient medications and allergies were                        reviewed. The patient's tolerance of previous                        anesthesia was also reviewed. The risks and benefits of                        the procedure and the sedation options and risks were                        discussed with the patient. All questions were                        answered, and informed consent was obtained. Prior                        Anticoagulants: The patient has taken no previous                        anticoagulant or antiplatelet agents. ASA Grade                        Assessment: II - A patient with mild systemic disease.                        After reviewing the risks and benefits, the patient was                        deemed in satisfactory condition to undergo the                        procedure.                       After obtaining informed consent, the endoscope was                        passed under direct vision. Throughout the procedure,  the patient's blood pressure, pulse, and oxygen                        saturations were monitored continuously. The Endoscope                        was introduced through the mouth, and advanced to the     second part of duodenum. The upper GI endoscopy was                        accomplished without difficulty. The patient tolerated                        the procedure well. Findings:      The examined esophagus was normal.      The entire examined stomach was normal.      A large fungating mass with no bleeding was found in the second portion       of the duodenum. Biopsies were taken with a cold forceps for histology.      One oozing cratered duodenal ulcer with pigmented material was found in       the second portion of the duodenum. Impression:           - Normal esophagus.                       - Normal stomach.                       - Duodenal mass. Biopsied.                       - One oozing duodenal ulcer with pigmented material. Recommendation:       - Return patient to hospital ward for ongoing care. Procedure Code(s):    --- Professional ---                       [phone], Esophagogastroduodenoscopy, flexible, transoral;                        with biopsy, single or multiple Diagnosis Code(s):    --- Professional ---                       D50.9, Iron deficiency anemia, unspecified                       K31.89, Other diseases of stomach and duodenum                       K26.4, Chronic or unspecified duodenal ulcer with                        hemorrhage CPT copyright 2016 [employer]. All rights reserved. The codes documented in this report are preliminary and upon coder review may  be revised to meet current compliance requirements. Lucilla Lame MD, MD 07/01/2016 2:09:04 PM This report has been signed electronically. Number of Addenda: 0 Note Initiated On: 07/01/2016 1:50 PM      [facility]

## 2016-07-01 NOTE — Progress Notes (Signed)
Pre HD assessment  

## 2016-07-01 NOTE — Progress Notes (Signed)
Pt expressed to RN that Norco 1 tablet that was given at 2248 06/30/2016 and 1 mg of morphine PRN that was given at 0023  did not help his pain in his neck where tunneled catheter was placed. [facility] doctor was notified, new orders for 0.5 mg of dilaudid given one time. Will continue to monitor.   Dawnn Nam [employer]

## 2016-07-01 NOTE — Progress Notes (Signed)
[place] at [place] NAME: Tristan Bender    MR#:  [id number]  DATE OF BIRTH:  1961/01/01  SUBJECTIVE:  CHIEF COMPLAINT:   Chief Complaint  Patient presents with  . Abnormal Lab  . Weakness   Patient seen in dialysis. No abdominal pain. Feels fatigued.  REVIEW OF SYSTEMS:    Review of Systems  Constitutional: Positive for malaise/fatigue. Negative for chills, fever and weight loss.  HENT: Negative for sore throat.   Eyes: Negative for blurred vision, double vision and pain.  Respiratory: Negative for cough, hemoptysis, shortness of breath and wheezing.   Cardiovascular: Negative for chest pain, palpitations, orthopnea and leg swelling.  Gastrointestinal: Positive for melena. Negative for abdominal pain, constipation, diarrhea, heartburn, nausea and vomiting.  Genitourinary: Negative for dysuria and hematuria.  Musculoskeletal: Negative for back pain and joint pain.  Skin: Negative for rash.  Neurological: Positive for dizziness and weakness. Negative for sensory change, speech change, focal weakness and headaches.  Endo/Heme/Allergies: Does not bruise/bleed easily.  Psychiatric/Behavioral: Negative for depression. The patient is not nervous/anxious.     DRUG ALLERGIES:  No Known Allergies  VITALS:  Blood pressure (!) 141/126, pulse 83, temperature 99.2 F (37.3 C), temperature source Oral, resp. rate 15, height 5\' 7"  (1.702 m), weight 133.5 kg (294 lb 5 oz), SpO2 99 %.  PHYSICAL EXAMINATION:   Physical Exam  GENERAL:  56 y.o.-year-old patient lying in the bed with no acute distress. Morbidly obese EYES: Pupils equal, round, reactive to light and accommodation. No scleral icterus. Extraocular muscles intact.  HEENT: Head atraumatic, normocephalic. Oropharynx and nasopharynx clear.  NECK:  Supple, no jugular venous distention. No thyroid enlargement, no tenderness.  LUNGS: Normal breath sounds bilaterally, no wheezing, rales,  rhonchi. No use of accessory muscles of respiration.  CARDIOVASCULAR: S1, S2 normal. No murmurs, rubs, or gallops.  ABDOMEN: Soft, nontender, nondistended. Bowel sounds present. No organomegaly or mass. Peritoneal dialysis catheter in place EXTREMITIES: No cyanosis, clubbing or edema b/l.    NEUROLOGIC: Cranial nerves II through XII are intact. No focal Motor or sensory deficits b/l.   PSYCHIATRIC: The patient is alert and oriented x 3.  SKIN: No obvious rash, lesion, or ulcer.   IJ dialysis catheter  LABORATORY PANEL:   CBC  Recent Labs Lab 07/01/16 0202 07/01/16 1036  WBC 5.8  --   HGB 6.4* 7.0*  HCT 17.8*  --   PLT 229  --    ------------------------------------------------------------------------------------------------------------------ Chemistries   Recent Labs Lab 06/30/16 1021  07/01/16 0202  NA 138  < > 140  K 5.5*  < > 4.9  CL 100*  < > 102  CO2 20*  < > 27  GLUCOSE 101*  < > 86  BUN 146*  < > 96*  CREATININE 27.29*  < > 19.12*  CALCIUM 6.8*  < > 6.7*  AST 21  --   --   ALT 14*  --   --   ALKPHOS 45  --   --   BILITOT 0.6  --   --   < > = values in this interval not displayed. ------------------------------------------------------------------------------------------------------------------  Cardiac Enzymes  Recent Labs Lab 06/30/16 1021  TROPONINI 0.10*   ------------------------------------------------------------------------------------------------------------------  RADIOLOGY:  No results found.   ASSESSMENT AND PLAN:   * acute blood loss anemia secondary to GI bleed - worsening Likely upper GI bleed   Recent admission to [facility] with 10 units of blood transfusion and finding of a duodenal  polyp. clipped   Keep nothing by mouth, IV fluids, 3 units of blood transfusion ordered by ER physician, Protonix IV twice a day. Patient will get EGD and colonoscopy later today Transfuse stat 2 units packed RBC with dialysis.  * End-stage renal disease  on peritoneal dialysis He was likely getting inadequate dialysis with his peritoneal dialysis. Now on hemodialysis. Nephrology on board.  * Hypertension Medications held due to GI bleed and low blood pressure.  * hyperkalemia resolved with dialysis  Patient is on transfer list to [facility] for further management of his GI bleed.   All the records are reviewed and case discussed with Care Management/Social Worker Management plans discussed with the patient, family and they are in agreement.  CODE STATUS: FULL CODE  DVT Prophylaxis: SCDs  TOTAL CC TIME TAKING CARE OF THIS PATIENT: 35 minutes.   POSSIBLE D/C IN 2-3 DAYS, DEPENDING ON CLINICAL CONDITION.  Hillary Bow R M.D on 07/01/2016 at 12:01 PM  Between 7am to 6pm - Pager - [phone]  After 6pm go to [email or web address] - password [email or web address] [facility]  Office  [phone]  CC: Primary care physician; No PCP Per Patient  Note: This dictation was prepared with Dragon dictation along with smaller phrase technology. Any transcriptional errors that result from this process are unintentional.

## 2016-07-01 NOTE — Anesthesia Preprocedure Evaluation (Signed)
Anesthesia Evaluation  Patient identified by MRN, date of birth, ID band Patient awake    Reviewed: Allergy & Precautions, NPO status , Patient's Chart, lab work & pertinent test results  History of Anesthesia Complications Negative for: history of anesthetic complications  Airway Mallampati: III  TM Distance: >3 FB Neck ROM: Full    Dental no notable dental hx.    Pulmonary sleep apnea , neg COPD,    breath sounds clear to auscultation- rhonchi (-) wheezing      Cardiovascular hypertension, Pt. 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     Comment: jan 2018- [facility] No date: Hypertension No date: Renal disorder   Reproductive/Obstetrics                             Anesthesia Physical Anesthesia Plan  ASA: IV  Anesthesia Plan: General   Post-op Pain Management:    Induction: Intravenous  Airway Management Planned: Natural Airway  Additional Equipment:   Intra-op Plan:   Post-operative Plan:   Informed Consent: I have reviewed the patients History and Physical, chart, labs and discussed the procedure including the risks, benefits and alternatives for the proposed anesthesia with the patient or authorized representative who has indicated his/her understanding and acceptance.   Dental advisory given  Plan Discussed with: CRNA and Anesthesiologist  Anesthesia Plan Comments:         Anesthesia Quick  Evaluation

## 2016-07-01 NOTE — Progress Notes (Signed)
Pt placed on home CPAP. CPAP plugged into red outlet. No damage noted to CPAP machine.

## 2016-07-01 NOTE — Plan of Care (Addendum)
[place] to check on bed status for patient to be transferred.  Still no bed is available.  Will pass in RN report to please continue to call to check on status @ [phone]  Will continue to monitor.

## 2016-07-01 NOTE — Progress Notes (Signed)
This note also relates to the following rows which could not be included: Pulse Rate - Cannot attach notes to unvalidated device data Resp - Cannot attach notes to unvalidated device data BP - Cannot attach notes to unvalidated device data  HD COMPLETED  

## 2016-07-02 ENCOUNTER — Other Ambulatory Visit: Payer: Self-pay

## 2016-07-02 LAB — RENAL FUNCTION PANEL
ALBUMIN: 2.6 g/dL — AB (ref 3.5–5.0)
Anion gap: 9 (ref 5–15)
BUN: 60 mg/dL — AB (ref 6–20)
CALCIUM: 7.4 mg/dL — AB (ref 8.9–10.3)
CO2: 27 mmol/L (ref 22–32)
Chloride: 103 mmol/L (ref 101–111)
Creatinine, Ser: 14.45 mg/dL — ABNORMAL HIGH (ref 0.61–1.24)
GFR calc Af Amer: 4 mL/min — ABNORMAL LOW (ref >60)
GFR calc non Af Amer: 3 mL/min — ABNORMAL LOW (ref >60)
GLUCOSE: 112 mg/dL — AB (ref 65–99)
PHOSPHORUS: 5.8 mg/dL — AB (ref 2.5–4.6)
Potassium: 4.6 mmol/L (ref 3.5–5.1)
SODIUM: 139 mmol/L (ref 135–145)

## 2016-07-02 LAB — HEPATITIS B SURFACE ANTIGEN: Hepatitis B Surface Ag: NEGATIVE

## 2016-07-02 LAB — HEMOGLOBIN: Hemoglobin: 7.7 g/dL — ABNORMAL LOW (ref 13.0–18.0)

## 2016-07-02 LAB — HEPATITIS B SURFACE ANTIBODY, QUANTITATIVE: HEPATITIS B-POST: 177.5 m[IU]/mL

## 2016-07-02 MED ORDER — DILTIAZEM HCL 25 MG/5ML IV SOLN
20.0000 mg | Freq: Once | INTRAVENOUS | Status: AC
  Administered 2016-07-02: 20 mg via INTRAVENOUS
  Filled 2016-07-02: qty 5

## 2016-07-02 MED ORDER — ZOLPIDEM TARTRATE 5 MG PO TABS
10.0000 mg | ORAL_TABLET | Freq: Every evening | ORAL | Status: DC | PRN
  Administered 2016-07-02 – 2016-07-03 (×2): 10 mg via ORAL
  Filled 2016-07-02 (×2): qty 2

## 2016-07-02 MED ORDER — AMLODIPINE BESYLATE 5 MG PO TABS
5.0000 mg | ORAL_TABLET | Freq: Every day | ORAL | Status: DC
  Administered 2016-07-02 – 2016-07-03 (×2): 5 mg via ORAL
  Filled 2016-07-02 (×2): qty 1

## 2016-07-02 NOTE — Progress Notes (Signed)
Subjective:   Patient seen during dialysis Tolerating well  States he feels overall improved  BP low during treatment, given saline bolus   HEMODIALYSIS FLOWSHEET:  Blood Flow Rate (mL/min): 300 mL/min Arterial Pressure (mmHg): -80 mmHg Venous Pressure (mmHg): 90 mmHg Transmembrane Pressure (mmHg): 40 mmHg Ultrafiltration Rate (mL/min): 0 mL/min Dialysate Flow Rate (mL/min): 600 ml/min Conductivity: Machine : 13.7 Conductivity: Machine : 13.7 Dialysis Fluid Bolus: Normal Saline Bolus Amount (mL): 100 mL Dialysate Change:  (3k) Intra-Hemodialysis Comments: 40. resting.      Objective:  Vital signs in last 24 hours:  Temp:  [98.4 F (36.9 C)-99.8 F (37.7 C)] 99.2 F (37.3 C) (02/17 1100) Pulse Rate:  [73-109] 74 (02/17 1350) Resp:  [11-22] 18 (02/17 1350) BP: (136-170)/(60-102) 145/96 (02/17 1350) SpO2:  [97 %-100 %] 100 % (02/17 1350) Weight:  [133 kg (293 lb 3.4 oz)] 133 kg (293 lb 3.4 oz) (02/17 1100)  Weight change: 6.493 kg (14 lb 5 oz) Filed Weights   06/30/16 1710 07/01/16 0934 07/02/16 1100  Weight: 127 kg (279 lb 15.8 oz) 133.5 kg (294 lb 5 oz) 133 kg (293 lb 3.4 oz)    Intake/Output:    Intake/Output Summary (Last 24 hours) at 07/02/16 1402 Last data filed at 07/02/16 1039  Gross per 24 hour  Intake              910 ml  Output              650 ml  Net              260 ml     Physical Exam: General: NAD, laying in bed  HEENT anicteric  Neck supple  Pulm/lungs Clear b/l  CVS/Heart irregular  Abdomen:  Soft, NT  Extremities: trace edema  Neurologic: Alert, oriented  Skin: No acute rashes  Access Rt IJ PC, PD catheter       Basic Metabolic Panel:   Recent Labs Lab 06/30/16 1021 06/30/16 2033 07/01/16 0202 07/02/16 0303  NA 138 139 140 139  K 5.5* 4.1 4.9 4.6  CL 100* 101 102 103  CO2 20* 28 27 27   GLUCOSE 101* 96 86 112*  BUN 146* 95* 96* 60*  CREATININE 27.29* 18.31* 19.12* 14.45*  CALCIUM 6.8* 7.0* 6.7* 7.4*  PHOS  --   --    --  5.8*     CBC:  Recent Labs Lab 06/30/16 1021  06/30/16 2033 07/01/16 0202 07/01/16 1036 07/01/16 1745 07/02/16 0303  WBC 7.9  --  7.7 5.8  --   --   --   NEUTROABS 5.6  --   --   --   --   --   --   HGB 4.4*  < > 6.7* 6.4* 7.0* 8.1* 7.7*  HCT 13.2*  --  19.1* 17.8*  --   --   --   MCV 88.3  --  87.4 86.3  --   --   --   PLT 320  --  262 229  --   --   --   < > = values in this interval not displayed.    Microbiology:  Recent Results (from the past 720 hour(s))  MRSA PCR Screening     Status: None   Collection Time: 06/30/16  9:42 PM  Result Value Ref Range Status   MRSA by PCR NEGATIVE NEGATIVE Final    Comment:        The GeneXpert MRSA Assay (FDA approved for  NASAL specimens only), is one component of a comprehensive MRSA colonization surveillance program. It is not intended to diagnose MRSA infection nor to guide or monitor treatment for MRSA infections.     Coagulation Studies:  Recent Labs  06/30/16 1021  LABPROT 13.8  INR 1.06    Urinalysis: No results for input(s): COLORURINE, LABSPEC, PHURINE, GLUCOSEU, HGBUR, BILIRUBINUR, KETONESUR, PROTEINUR, UROBILINOGEN, NITRITE, LEUKOCYTESUR in the last 72 hours.  Invalid input(s): APPERANCEUR    Imaging: No results found.   Medications:   . pantoprozole (PROTONIX) infusion 8 mg/hr (07/02/16 0852)   . calcitRIOL  0.5 mcg Oral Daily  . cinacalcet  60 mg Oral Q breakfast  . sevelamer carbonate  1,600 mg Oral TID WC  . sodium chloride flush  3 mL Intravenous Q12H   ALPRAZolam, docusate sodium, HYDROcodone-acetaminophen, hydrOXYzine, morphine injection, zolpidem  Assessment/ Plan:  56 y.o.AA  male  with a history of chronic anemia, HTN, ESRD secondary to polycystic kidney disease currently on PD, who was admitted to [facility] on 06/30/2016 for evaluation of severe anemia and uremia. Patient was see by primary and sent to [facility] ED, there he was  found to have a Hgb of 4.4 and S Cr of 27.29. PD  prescription currently 4 exchanges at night and 3 exchanges during the day. Had a recent admission to [facility](06/06/16-06/15/16) for anemia and found to have a large ulcerative duodenal polyp that was removed. He received 10 units of PRBC during that stay.    1. 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 -Currently on PD with exchanges all day and night. - Suspect peritoneal membrane failure as he is not responding to current treatments and appears to be poorly dialyzed and uremic -  # 3 HD treatment today - seems to have improved clinically - Discussed with nurse present today at the outpatient dialysis center in [facility]) - Patient will have a chair time of 11.15 am on TTS  2. Anemia of CKD and blood loss;  Multiple units of blood transfused Uremic platelet dysfunction probably contributing GI evaluation is in progress  3. SHPTH - monitor phosphorus level   4. Hyperkalemia -Corrected with HD    LOS: 2 Rebeccah Ivins 2/17/20182:02 PM

## 2016-07-02 NOTE — Progress Notes (Signed)
Pre hd info 

## 2016-07-02 NOTE — Progress Notes (Signed)
Post hd assessment 

## 2016-07-02 NOTE — Progress Notes (Signed)
Post hd info

## 2016-07-02 NOTE — Progress Notes (Signed)
Hd start 

## 2016-07-02 NOTE — Progress Notes (Signed)
[place] to check on bed status for patient to be transferred.  Still no bed is available.  Will pass in report to RN to please continue to call to check on status @ [phone]

## 2016-07-02 NOTE — Progress Notes (Signed)
  End of hd 

## 2016-07-02 NOTE — Progress Notes (Signed)
[place] at [place] NAME: Tristan Bender    MR#:  [id number]  DATE OF BIRTH:  12-16-1960  SUBJECTIVE:  CHIEF COMPLAINT:   Chief Complaint  Patient presents with  . Abnormal Lab  . Weakness   Patient seen in dialysis. No abdominal pain. Hb stable.  REVIEW OF SYSTEMS:    Review of Systems  Constitutional: Positive for malaise/fatigue. Negative for chills, fever and weight loss.  HENT: Negative for sore throat.   Eyes: Negative for blurred vision, double vision and pain.  Respiratory: Negative for cough, hemoptysis, shortness of breath and wheezing.   Cardiovascular: Negative for chest pain, palpitations, orthopnea and leg swelling.  Gastrointestinal: Positive for melena. Negative for abdominal pain, constipation, diarrhea, heartburn, nausea and vomiting.  Genitourinary: Negative for dysuria and hematuria.  Musculoskeletal: Negative for back pain and joint pain.  Skin: Negative for rash.  Neurological: Positive for dizziness and weakness. Negative for sensory change, speech change, focal weakness and headaches.  Endo/Heme/Allergies: Does not bruise/bleed easily.  Psychiatric/Behavioral: Negative for depression. The patient is not nervous/anxious.     DRUG ALLERGIES:  No Known Allergies  VITALS:  Blood pressure 112/76, pulse (!) 109, temperature 99.5 F (37.5 C), temperature source Oral, resp. rate 20, height 5\' 7"  (1.702 m), weight 133.1 kg (293 lb 6.9 oz), SpO2 96 %.  PHYSICAL EXAMINATION:   Physical Exam  GENERAL:  56 y.o.-year-old patient lying in the bed with no acute distress. Morbidly obese EYES: Pupils equal, round, reactive to light and accommodation. No scleral icterus. Extraocular muscles intact.  HEENT: Head atraumatic, normocephalic. Oropharynx and nasopharynx clear.  NECK:  Supple, no jugular venous distention. No thyroid enlargement, no tenderness.  LUNGS: Normal breath sounds bilaterally, no wheezing, rales, rhonchi.  No use of accessory muscles of respiration.  CARDIOVASCULAR: S1, S2 normal. No murmurs, rubs, or gallops.  ABDOMEN: Soft, nontender, nondistended. Bowel sounds present. No organomegaly or mass. Peritoneal dialysis catheter in place EXTREMITIES: No cyanosis, clubbing or edema b/l.    NEUROLOGIC: Cranial nerves II through XII are intact. No focal Motor or sensory deficits b/l.   PSYCHIATRIC: The patient is alert and oriented x 3.  SKIN: No obvious rash, lesion, or ulcer.   IJ dialysis catheter  LABORATORY PANEL:   CBC  Recent Labs Lab 07/01/16 0202  07/02/16 0303  WBC 5.8  --   --   HGB 6.4*  < > 7.7*  HCT 17.8*  --   --   PLT 229  --   --   < > = values in this interval not displayed. ------------------------------------------------------------------------------------------------------------------ Chemistries   Recent Labs Lab 06/30/16 1021  07/02/16 0303  NA 138  < > 139  K 5.5*  < > 4.6  CL 100*  < > 103  CO2 20*  < > 27  GLUCOSE 101*  < > 112*  BUN 146*  < > 60*  CREATININE 27.29*  < > 14.45*  CALCIUM 6.8*  < > 7.4*  AST 21  --   --   ALT 14*  --   --   ALKPHOS 45  --   --   BILITOT 0.6  --   --   < > = values in this interval not displayed. ------------------------------------------------------------------------------------------------------------------  Cardiac Enzymes  Recent Labs Lab 06/30/16 1021  TROPONINI 0.10*   ------------------------------------------------------------------------------------------------------------------  RADIOLOGY:  No results found.   ASSESSMENT AND PLAN:   * acute blood loss anemia secondary to GI bleed - worsening  Likely upper GI bleed   Recent admission to [facility] with 10 units of blood transfusion and finding of a duodenal polyp. clipped   Keep nothing by mouth, IV fluids, 3 units of blood transfusion ordered by ER physician, Protonix IV twice a day. Transfuse stat 2 units packed RBC with dialysis. EGD showed a duodenal  mass, biopsies taken.  * End-stage renal disease on peritoneal dialysis He was likely getting inadequate dialysis with his peritoneal dialysis. Now on hemodialysis. Nephrology on board.  * Hypertension Medications held due to GI bleed and low blood pressure.   Now BP coming up, resume Amlodipine.  * hyperkalemia resolved with dialysis  Patient is on transfer list to [facility] for further management of his GI bleed.   All the records are reviewed and case discussed with Care Management/Social Worker Management plans discussed with the patient, family and they are in agreement.  CODE STATUS: FULL CODE  DVT Prophylaxis: SCDs  TOTAL CC TIME TAKING CARE OF THIS PATIENT: 35 minutes.   POSSIBLE D/C IN 2-3 DAYS, DEPENDING ON CLINICAL CONDITION.  Vaughan Basta M.D on 07/02/2016 at 8:50 PM  Between 7am to 6pm - Pager - [phone]  After 6pm go to [email or web address] - password [email or web address] [facility]  Office  [phone]  CC: Primary care physician; No PCP Per Patient  Note: This dictation was prepared with Dragon dictation along with smaller phrase technology. Any transcriptional errors that result from this process are unintentional.

## 2016-07-03 LAB — CBC
HCT: 23.6 % — ABNORMAL LOW (ref 40.0–52.0)
HEMOGLOBIN: 8 g/dL — AB (ref 13.0–18.0)
MCH: 30 pg (ref 26.0–34.0)
MCHC: 33.7 g/dL (ref 32.0–36.0)
MCV: 89.2 fL (ref 80.0–100.0)
PLATELETS: 172 10*3/uL (ref 150–440)
RBC: 2.65 MIL/uL — AB (ref 4.40–5.90)
RDW: 15.4 % — ABNORMAL HIGH (ref 11.5–14.5)
WBC: 6.8 10*3/uL (ref 3.8–10.6)

## 2016-07-03 MED ORDER — FERROUS SULFATE 325 (65 FE) MG PO TABS: 325.0000 mg | ORAL_TABLET | Freq: Two times a day (BID) | ORAL | Status: DC

## 2016-07-03 MED ORDER — DOCUSATE SODIUM 100 MG PO CAPS: 100.0000 mg | ORAL_CAPSULE | Freq: Two times a day (BID) | ORAL | 0 refills | Status: DC | PRN

## 2016-07-03 MED ORDER — PANTOPRAZOLE SODIUM 40 MG PO TBEC: 40.0000 mg | DELAYED_RELEASE_TABLET | Freq: Two times a day (BID) | ORAL | 11 refills | Status: DC

## 2016-07-03 MED ORDER — FERROUS SULFATE 325 (65 FE) MG PO TABS: 325.0000 mg | ORAL_TABLET | Freq: Two times a day (BID) | ORAL | 3 refills | Status: DC

## 2016-07-03 MED ORDER — ZOLPIDEM TARTRATE 5 MG PO TABS: 5.0000 mg | ORAL_TABLET | Freq: Every evening | ORAL | 0 refills | Status: DC | PRN

## 2016-07-03 NOTE — Progress Notes (Signed)
Subjective:    patient states he feels much better Asking about going home No shortness of breath No nausea or vomiting    Objective:  Vital signs in last 24 hours:  Temp:  [98.2 F (36.8 C)-99.5 F (37.5 C)] 98.2 F (36.8 C) (02/18 0819) Pulse Rate:  [73-160] 90 (02/18 0819) Resp:  [16-20] 18 (02/18 0819) BP: (107-156)/(67-103) 135/76 (02/18 0819) SpO2:  [94 %-100 %] 96 % (02/18 0819) Weight:  [133.1 kg (293 lb 6.9 oz)] 133.1 kg (293 lb 6.9 oz) (02/17 1433)  Weight change: -0.5 kg (-1 lb 1.6 oz) Filed Weights   07/01/16 0934 07/02/16 1100 07/02/16 1433  Weight: 133.5 kg (294 lb 5 oz) 133 kg (293 lb 3.4 oz) 133.1 kg (293 lb 6.9 oz)    Intake/Output:    Intake/Output Summary (Last 24 hours) at 07/03/16 1204 Last data filed at 07/03/16 1022  Gross per 24 hour  Intake          1195.36 ml  Output              500 ml  Net           695.36 ml     Physical Exam: General: NAD, laying in bed  HEENT anicteric  Neck supple  Pulm/lungs Clear b/l  CVS/Heart irregular  Abdomen:  Soft, NT  Extremities: trace edema  Neurologic: Alert, oriented  Skin: No acute rashes  Access Rt IJ PC, PD catheter       Basic Metabolic Panel:   Recent Labs Lab 06/30/16 1021 06/30/16 2033 07/01/16 0202 07/02/16 0303  NA 138 139 140 139  K 5.5* 4.1 4.9 4.6  CL 100* 101 102 103  CO2 20* 28 27 27   GLUCOSE 101* 96 86 112*  BUN 146* 95* 96* 60*  CREATININE 27.29* 18.31* 19.12* 14.45*  CALCIUM 6.8* 7.0* 6.7* 7.4*  PHOS  --   --   --  5.8*     CBC:  Recent Labs Lab 06/30/16 1021  06/30/16 2033 07/01/16 0202 07/01/16 1036 07/01/16 1745 07/02/16 0303 07/03/16 0346  WBC 7.9  --  7.7 5.8  --   --   --  6.8  NEUTROABS 5.6  --   --   --   --   --   --   --   HGB 4.4*  < > 6.7* 6.4* 7.0* 8.1* 7.7* 8.0*  HCT 13.2*  --  19.1* 17.8*  --   --   --  23.6*  MCV 88.3  --  87.4 86.3  --   --   --  89.2  PLT 320  --  262 229  --   --   --  172  < > = values in this interval not  displayed.    Microbiology:  Recent Results (from the past 720 hour(s))  MRSA PCR Screening     Status: None   Collection Time: 06/30/16  9:42 PM  Result Value Ref Range Status   MRSA by PCR NEGATIVE NEGATIVE Final    Comment:        The GeneXpert MRSA Assay (FDA approved for NASAL specimens only), is one component of a comprehensive MRSA colonization surveillance program. It is not intended to diagnose MRSA infection nor to guide or monitor treatment for MRSA infections.     Coagulation Studies: No results for input(s): LABPROT, INR in the last 72 hours.  Urinalysis: No results for input(s): COLORURINE, LABSPEC, PHURINE, GLUCOSEU, HGBUR, BILIRUBINUR, KETONESUR, PROTEINUR, UROBILINOGEN, NITRITE,  LEUKOCYTESUR in the last 72 hours.  Invalid input(s): APPERANCEUR    Imaging: No results found.   Medications:    . amLODipine  5 mg Oral Daily  . calcitRIOL  0.5 mcg Oral Daily  . cinacalcet  60 mg Oral Q breakfast  . ferrous sulfate  325 mg Oral BID WC  . sevelamer carbonate  1,600 mg Oral TID WC  . sodium chloride flush  3 mL Intravenous Q12H   ALPRAZolam, docusate sodium, HYDROcodone-acetaminophen, hydrOXYzine, morphine injection, zolpidem  Assessment/ Plan:  56 y.o.AA  male  with a history of chronic anemia, HTN, ESRD secondary to polycystic kidney disease currently on PD, who was admitted to [facility] on 06/30/2016 for evaluation of severe anemia and uremia. Patient was see by primary and sent to [facility] ED, there he was  found to have a Hgb of 4.4 and S Cr of 27.29. PD prescription currently 4 exchanges at night and 3 exchanges during the day. Had a recent admission to [facility](06/06/16-06/15/16) for anemia and found to have a large ulcerative duodenal polyp that was removed. He received 10 units of PRBC during that stay.    1. 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 -Currently on PD with exchanges all day and night. - Suspect peritoneal membrane failure as he is not  responding to current treatments and appears to be poorly dialyzed and uremic   - seems to have much improved clinically - Discussed with nurse yesterday at the outpatient dialysis center in [facility]) - Patient will have a chair time of 11.15 am on TTS - OK to d/c home from renal standpoint  2. Anemia of CKD and blood loss;  Multiple units of blood transfused Uremic platelet dysfunction probably contributing GI evaluation is in progress  3. SHPTH - monitor phosphorus level   4. Hyperkalemia -Corrected with HD    LOS: 3 Kynedi Profitt 2/18/201812:04 PM

## 2016-07-03 NOTE — Plan of Care (Signed)
Problem: Physical Regulation: Goal: Will remain free from infection Outcome: Progressing Free from new infection  Problem: Skin Integrity: Goal: Risk for impaired skin integrity will decrease Outcome: Not Met (add Reason) Noted some bruising on right arm and leg

## 2016-07-03 NOTE — Discharge Summary (Signed)
[facility] [place] NAME: Tristan Bender    MR#:  [id number]  DATE OF BIRTH:  09/25/1960  DATE OF ADMISSION:  06/30/2016 ADMITTING PHYSICIAN: Vaughan Basta, MD  DATE OF DISCHARGE: 07/03/2016  PRIMARY CARE PHYSICIAN: No PCP Per Patient    ADMISSION DIAGNOSIS:  ESRD (end stage renal disease) on dialysis ([place]) [N18.6, Z99.2] Gastrointestinal hemorrhage with melena [K92.1]  DISCHARGE DIAGNOSIS:  Principal Problem:   GI bleed Active Problems:   Symptomatic anemia   Iron deficiency anemia due to chronic blood loss   Other diseases of stomach and duodenum   Chronic duodenal ulcer with hemorrhage   SECONDARY DIAGNOSIS:   Past Medical History:  Diagnosis Date  . Adenomatous duodenal polyp    jan 2018[facility]  . Hypertension   . Renal disorder     HOSPITAL COURSE:   * acute blood loss anemia secondary to GI bleed - worsening Likely upper GI bleed Recent admission to [facility] with 10 units of blood transfusion and finding of a duodenal polyp. clipped Keep nothing by mouth, IV fluids, 3 units of blood transfusion ordered by ER physician, Protonix IV twice a day. Transfuse stat 2 units packed RBC with dialysis. EGD showed a duodenal mass, biopsies taken. Give PPI oral BID on discharge.   HB Stable, d.c with advise of soft diet and follow up with GI.  * End-stage renal disease on peritoneal dialysis He was likely getting inadequate dialysis with his peritoneal dialysis. Now on hemodialysis. Nephrology on board.  They arranged for Out pt hemodialysis for him  * Hypertension Medications held due to GI bleed and low blood pressure.   Now BP coming up, resume Amlodipine.  * hyperkalemia resolved with dialysis  DISCHARGE CONDITIONS:   Stable.  CONSULTS OBTAINED:  Treatment Team:  Murlean Iba, MD Lucilla Lame, MD  DRUG ALLERGIES:  No Known Allergies  DISCHARGE MEDICATIONS:   Current Discharge Medication List     START taking these medications   Details  docusate sodium (COLACE) 100 MG capsule Take 1 capsule (100 mg total) by mouth 2 (two) times daily as needed for mild constipation. Qty: 10 capsule, Refills: 0    zolpidem (AMBIEN) 5 MG tablet Take 1 tablet (5 mg total) by mouth at bedtime as needed for sleep. Qty: 15 tablet, Refills: 0      CONTINUE these medications which have CHANGED   Details  pantoprazole (PROTONIX) 40 MG tablet Take 1 tablet (40 mg total) by mouth 2 (two) times daily. Qty: 60 tablet, Refills: 11      CONTINUE these medications which have NOT CHANGED   Details  allopurinol (ZYLOPRIM) 100 MG tablet Take 100 mg by mouth daily.    ALPRAZolam (XANAX) 0.5 MG tablet Take 0.5 mg by mouth at bedtime as needed. Refills: 0    amLODipine (NORVASC) 5 MG tablet Take 5 mg by mouth daily.    calcitRIOL (ROCALTROL) 0.5 MCG capsule Take 0.5 mcg by mouth daily.    Cholecalciferol (VITAMIN D PO) Take 1 tablet by mouth daily.    cinacalcet (SENSIPAR) 30 MG tablet Take 60 mg by mouth daily.    hydrOXYzine (ATARAX/VISTARIL) 50 MG tablet Take 100 mg by mouth 3 times/day as needed-between meals & bedtime. Refills: 0    sevelamer carbonate (RENVELA) 800 MG tablet Take 1,600 mg by mouth 3 (three) times daily as needed. Refills: 2      STOP taking these medications     ondansetron (ZOFRAN) 8  MG tablet          DISCHARGE INSTRUCTIONS:    Follow with GI clinic in 2-3 weeks.  If you experience worsening of your admission symptoms, develop shortness of breath, life threatening emergency, suicidal or homicidal thoughts you must seek medical attention immediately by calling 911 or calling your MD immediately  if symptoms less severe.  You Must read complete instructions/literature along with all the possible adverse reactions/side effects for all the Medicines you take and that have been prescribed to you. Take any new Medicines after you have completely understood and accept all  the possible adverse reactions/side effects.   Please note  You were cared for by a hospitalist during your hospital stay. If you have any questions about your discharge medications or the care you received while you were in the hospital after you are discharged, you can call the unit and asked to speak with the hospitalist on call if the hospitalist that took care of you is not available. Once you are discharged, your primary care physician will handle any further medical issues. Please note that NO REFILLS for any discharge medications will be authorized once you are discharged, as it is imperative that you return to your primary care physician (or establish a relationship with a primary care physician if you do not have one) for your aftercare needs so that they can reassess your need for medications and monitor your lab values.    Today   CHIEF COMPLAINT:   Chief Complaint  Patient presents with  . Abnormal Lab  . Weakness    HISTORY OF PRESENT ILLNESS:  Tristan Bender  is a 56 y.o. male with a known history of End-stage renal disease on peritoneal dialysis, hypertension- was admitted in [facility] 2 weeks ago with symptomatic and severe anemia, EGD was done which showed a large duodenal polyp and some ulcer, which was removed and biopsies were sent. He received 10 units of PRBC transfusion during that hospital stay and discharged home with advice to follow in GI clinic in 6 weeks. Also we're planning to switch him to hemodialysis soon. As per patient they told him the biopsy results were negative for cancer, I could not find that in reports from [facility]. Patient had his regular lab work check done by his primary care physician and today he received a call by him saying that his hemoglobin is low again and he needed to go to emergency room right away. He came to emergency room and found to have anemia with hemoglobin up to 4.5 so ER physician ordered 3 units of transfusion and called for medical  admission. On further questioning patient denies any abdominal pain or vomiting, he also denies any weakness or fatigue symptoms and says that this is nothing compared to what he was feeling 2-3 weeks ago. He noticed some dark stool but no red blood in his stool.    VITAL SIGNS:  Blood pressure 135/76, pulse 90, temperature 98.2 F (36.8 C), temperature source Oral, resp. rate 18, height 5\' 7"  (1.702 m), weight 133.1 kg (293 lb 6.9 oz), SpO2 96 %.  I/O:   Intake/Output Summary (Last 24 hours) at 07/03/16 1101 Last data filed at 07/03/16 1022  Gross per 24 hour  Intake          1195.36 ml  Output              500 ml  Net           695.36 ml  PHYSICAL EXAMINATION:   GENERAL:  56 y.o.-year-old patient lying in the bed with no acute distress. Morbidly obese EYES: Pupils equal, round, reactive to light and accommodation. No scleral icterus. Extraocular muscles intact.  HEENT: Head atraumatic, normocephalic. Oropharynx and nasopharynx clear.  NECK:  Supple, no jugular venous distention. No thyroid enlargement, no tenderness.  LUNGS: Normal breath sounds bilaterally, no wheezing, rales, rhonchi. No use of accessory muscles of respiration.  CARDIOVASCULAR: S1, S2 normal. No murmurs, rubs, or gallops.  ABDOMEN: Soft, nontender, nondistended. Bowel sounds present. No organomegaly or mass. Peritoneal dialysis catheter in place EXTREMITIES: No cyanosis, clubbing or edema b/l.    NEUROLOGIC: Cranial nerves II through XII are intact. No focal Motor or sensory deficits b/l.   PSYCHIATRIC: The patient is alert and oriented x 3.  SKIN: No obvious rash, lesion, or ulcer.    DATA REVIEW:   CBC  Recent Labs Lab 07/03/16 0346  WBC 6.8  HGB 8.0*  HCT 23.6*  PLT 172    Chemistries   Recent Labs Lab 06/30/16 1021  07/02/16 0303  NA 138  < > 139  K 5.5*  < > 4.6  CL 100*  < > 103  CO2 20*  < > 27  GLUCOSE 101*  < > 112*  BUN 146*  < > 60*  CREATININE 27.29*  < > 14.45*  CALCIUM  6.8*  < > 7.4*  AST 21  --   --   ALT 14*  --   --   ALKPHOS 45  --   --   BILITOT 0.6  --   --   < > = values in this interval not displayed.  Cardiac Enzymes  Recent Labs Lab 06/30/16 1021  TROPONINI 0.10*    Microbiology Results  Results for orders placed or performed during the hospital encounter of 06/30/16  MRSA PCR Screening     Status: None   Collection Time: 06/30/16  9:42 PM  Result Value Ref Range Status   MRSA by PCR NEGATIVE NEGATIVE Final    Comment:        The GeneXpert MRSA Assay (FDA approved for NASAL specimens only), is one component of a comprehensive MRSA colonization surveillance program. It is not intended to diagnose MRSA infection nor to guide or monitor treatment for MRSA infections.     RADIOLOGY:  No results found.  EKG:   Orders placed or performed during the hospital encounter of 06/30/16  . EKG 12-Lead  . EKG 12-Lead  . EKG 12-Lead  . EKG 12-Lead  . EKG 12-Lead  . EKG 12-Lead  . EKG 12-Lead  . EKG 12-Lead  . EKG 12-Lead  . EKG 12-Lead      Management plans discussed with the patient, family and they are in agreement.  CODE STATUS:     Code Status Orders        Start     Ordered   06/30/16 1706  Full code  Continuous     06/30/16 1705    Code Status History    Date Active Date Inactive Code Status Order ID Comments User Context   This patient has a current code status but no historical code status.      TOTAL TIME TAKING CARE OF THIS PATIENT: 35 minutes.    Vaughan Basta M.D on 07/03/2016 at 11:01 AM  Between 7am to 6pm - Pager - [phone]  After 6pm go to [email or web address] - password [email or web address]  [employer]  [phone]  CC: Primary care physician; No PCP Per Patient   Note: This dictation was prepared with Dragon dictation along with smaller phrase technology. Any transcriptional errors that result from this process are unintentional.

## 2016-07-03 NOTE — Discharge Instructions (Signed)
Follow at [place] clinic in 2-3 weeks.

## 2016-07-03 NOTE — Progress Notes (Signed)
IV was removed. Discharge instructions, follow-up appointments, and prescriptions were provided to the pt. All questions were answered. The pt was taken downstairs via wheelchair by NT.  

## 2016-07-04 ENCOUNTER — Encounter: Payer: Self-pay | Admitting: Gastroenterology

## 2016-07-04 LAB — TYPE AND SCREEN
BLOOD PRODUCT EXPIRATION DATE: 201802202359
Blood Product Expiration Date: 201803072359
Blood Product Expiration Date: 201803072359
Blood Product Expiration Date: 201803072359
Blood Product Expiration Date: 201803082359
Blood Product Expiration Date: 201803092359
Blood Product Expiration Date: 201803092359
ISSUE DATE / TIME: 201802151255
ISSUE DATE / TIME: 201802151502
ISSUE DATE / TIME: 201802151751
ISSUE DATE / TIME: 201802161006
ISSUE DATE / TIME: 201802161105
UNIT TYPE AND RH: 6200
UNIT TYPE AND RH: 6200
Unit Type and Rh: 600
Unit Type and Rh: 600
Unit Type and Rh: 600
Unit Type and Rh: 6200
Unit Type and Rh: 9500

## 2016-07-04 NOTE — Anesthesia Postprocedure Evaluation (Signed)
Anesthesia Post Note  Patient: Tristan Bender  Procedure(s) Performed: Procedure(s) (LRB): ESOPHAGOGASTRODUODENOSCOPY (EGD) WITH PROPOFOL (N/A)  Patient location during evaluation: PACU Anesthesia Type: General Level of consciousness: awake and alert and oriented Pain management: pain level controlled Vital Signs Assessment: post-procedure vital signs reviewed and stable Respiratory status: spontaneous breathing Cardiovascular status: blood pressure returned to baseline Anesthetic complications: no     Last Vitals:  Vitals:   07/03/16 0603 07/03/16 0819  BP: (!) 142/92 135/76  Pulse: 88 90  Resp: 18 18  Temp: 37.1 C 36.8 C    Last Pain:  Vitals:   07/03/16 0819  TempSrc: Oral  PainSc:                  [name]

## 2016-07-05 LAB — SURGICAL PATHOLOGY

## 2016-07-19 ENCOUNTER — Other Ambulatory Visit
Admission: RE | Admit: 2016-07-19 | Discharge: 2016-07-19 | Disposition: A | Discharge status: Home / Self Care | Payer: BLUE CROSS/BLUE SHIELD | Source: Ambulatory Visit | Attending: Nephrology | Admitting: Nephrology

## 2016-07-19 DIAGNOSIS — N186 End stage renal disease: Secondary | ICD-10-CM | POA: Insufficient documentation

## 2016-07-19 LAB — HEMOGLOBIN: HEMOGLOBIN: 5.6 g/dL — AB (ref 13.0–18.0)

## 2016-07-21 ENCOUNTER — Inpatient Hospital Stay
Admission: EM | Admit: 2016-07-21 | Discharge: 2016-07-22 | DRG: 811 | Disposition: A | Discharge status: Home / Self Care | Payer: BLUE CROSS/BLUE SHIELD | Attending: Internal Medicine | Admitting: Internal Medicine

## 2016-07-21 ENCOUNTER — Encounter: Payer: Self-pay | Admitting: Emergency Medicine

## 2016-07-21 DIAGNOSIS — Z841 Family history of disorders of kidney and ureter: Secondary | ICD-10-CM

## 2016-07-21 DIAGNOSIS — D509 Iron deficiency anemia, unspecified: Secondary | ICD-10-CM | POA: Diagnosis present

## 2016-07-21 DIAGNOSIS — I120 Hypertensive chronic kidney disease with stage 5 chronic kidney disease or end stage renal disease: Secondary | ICD-10-CM | POA: Diagnosis present

## 2016-07-21 DIAGNOSIS — K219 Gastro-esophageal reflux disease without esophagitis: Secondary | ICD-10-CM | POA: Diagnosis present

## 2016-07-21 DIAGNOSIS — D631 Anemia in chronic kidney disease: Secondary | ICD-10-CM | POA: Diagnosis present

## 2016-07-21 DIAGNOSIS — D62 Acute posthemorrhagic anemia: Secondary | ICD-10-CM | POA: Diagnosis not present

## 2016-07-21 DIAGNOSIS — Z992 Dependence on renal dialysis: Secondary | ICD-10-CM

## 2016-07-21 DIAGNOSIS — D649 Anemia, unspecified: Secondary | ICD-10-CM | POA: Diagnosis not present

## 2016-07-21 DIAGNOSIS — Z85820 Personal history of malignant melanoma of skin: Secondary | ICD-10-CM

## 2016-07-21 DIAGNOSIS — Q613 Polycystic kidney, unspecified: Secondary | ICD-10-CM | POA: Diagnosis not present

## 2016-07-21 DIAGNOSIS — N186 End stage renal disease: Secondary | ICD-10-CM | POA: Diagnosis present

## 2016-07-21 DIAGNOSIS — Z79899 Other long term (current) drug therapy: Secondary | ICD-10-CM

## 2016-07-21 DIAGNOSIS — D132 Benign neoplasm of duodenum: Secondary | ICD-10-CM | POA: Diagnosis present

## 2016-07-21 DIAGNOSIS — K922 Gastrointestinal hemorrhage, unspecified: Secondary | ICD-10-CM | POA: Diagnosis present

## 2016-07-21 LAB — CBC WITH DIFFERENTIAL/PLATELET
Basophils Absolute: 0.1 10*3/uL (ref 0–0.1)
Basophils Relative: 1 %
EOS ABS: 0.3 10*3/uL (ref 0–0.7)
EOS PCT: 3 %
HCT: 14.9 % — CL (ref 40.0–52.0)
HEMOGLOBIN: 4.7 g/dL — AB (ref 13.0–18.0)
LYMPHS ABS: 1.5 10*3/uL (ref 1.0–3.6)
Lymphocytes Relative: 13 %
MCH: 28.7 pg (ref 26.0–34.0)
MCHC: 31.8 g/dL — AB (ref 32.0–36.0)
MCV: 90.3 fL (ref 80.0–100.0)
MONOS PCT: 9 %
Monocytes Absolute: 1.1 10*3/uL — ABNORMAL HIGH (ref 0.2–1.0)
NEUTROS PCT: 74 %
Neutro Abs: 8.8 10*3/uL — ABNORMAL HIGH (ref 1.4–6.5)
Platelets: 222 10*3/uL (ref 150–440)
RBC: 1.65 MIL/uL — ABNORMAL LOW (ref 4.40–5.90)
RDW: 16.4 % — ABNORMAL HIGH (ref 11.5–14.5)
WBC: 11.7 10*3/uL — ABNORMAL HIGH (ref 3.8–10.6)

## 2016-07-21 LAB — BASIC METABOLIC PANEL
Anion gap: 10 (ref 5–15)
BUN: 71 mg/dL — AB (ref 6–20)
CHLORIDE: 100 mmol/L — AB (ref 101–111)
CO2: 25 mmol/L (ref 22–32)
Calcium: 7.2 mg/dL — ABNORMAL LOW (ref 8.9–10.3)
Creatinine, Ser: 12.34 mg/dL — ABNORMAL HIGH (ref 0.61–1.24)
GFR calc Af Amer: 5 mL/min — ABNORMAL LOW (ref >60)
GFR calc non Af Amer: 4 mL/min — ABNORMAL LOW (ref >60)
GLUCOSE: 95 mg/dL (ref 65–99)
POTASSIUM: 3.7 mmol/L (ref 3.5–5.1)
SODIUM: 135 mmol/L (ref 135–145)

## 2016-07-21 LAB — PREPARE RBC (CROSSMATCH)

## 2016-07-21 LAB — TROPONIN I
TROPONIN I: 0.06 ng/mL — AB (ref <0.03)
Troponin I: 0.07 ng/mL (ref <0.03)

## 2016-07-21 LAB — HEMOGLOBIN AND HEMATOCRIT, BLOOD
HEMATOCRIT: 17.6 % — AB (ref 40.0–52.0)
HEMOGLOBIN: 6 g/dL — AB (ref 13.0–18.0)

## 2016-07-21 MED ORDER — FERROUS SULFATE 325 (65 FE) MG PO TABS
325.0000 mg | ORAL_TABLET | Freq: Two times a day (BID) | ORAL | Status: DC
  Administered 2016-07-21 – 2016-07-22 (×2): 325 mg via ORAL
  Filled 2016-07-21 (×2): qty 1

## 2016-07-21 MED ORDER — SODIUM CHLORIDE 0.9 % IV SOLN
10.0000 mL/h | Freq: Once | INTRAVENOUS | Status: AC
  Administered 2016-07-21: 10 mL/h via INTRAVENOUS

## 2016-07-21 MED ORDER — ALLOPURINOL 100 MG PO TABS
100.0000 mg | ORAL_TABLET | Freq: Every day | ORAL | Status: DC
  Administered 2016-07-21 – 2016-07-22 (×2): 100 mg via ORAL
  Filled 2016-07-21 (×2): qty 1

## 2016-07-21 MED ORDER — CALCIUM ACETATE 667 MG PO CAPS
2668.0000 mg | ORAL_CAPSULE | Freq: Three times a day (TID) | ORAL | Status: DC
  Administered 2016-07-22 (×2): 2668 mg via ORAL
  Filled 2016-07-21 (×5): qty 4

## 2016-07-21 MED ORDER — ONDANSETRON HCL 4 MG/2ML IJ SOLN: 4.0000 mg | Freq: Four times a day (QID) | INTRAMUSCULAR | Status: DC | PRN

## 2016-07-21 MED ORDER — PANTOPRAZOLE SODIUM 40 MG PO TBEC
40.0000 mg | DELAYED_RELEASE_TABLET | Freq: Two times a day (BID) | ORAL | Status: DC
  Administered 2016-07-21 – 2016-07-22 (×2): 40 mg via ORAL
  Filled 2016-07-21 (×2): qty 1

## 2016-07-21 MED ORDER — SEVELAMER CARBONATE 800 MG PO TABS
1600.0000 mg | ORAL_TABLET | Freq: Three times a day (TID) | ORAL | Status: DC
Start: 2016-07-22 — End: 2016-07-22
  Administered 2016-07-22 (×2): 1600 mg via ORAL
  Filled 2016-07-21 (×2): qty 2

## 2016-07-21 MED ORDER — CALCITRIOL 0.5 MCG PO CAPS
0.5000 ug | ORAL_CAPSULE | Freq: Every day | ORAL | Status: DC
  Administered 2016-07-21 – 2016-07-22 (×2): 0.5 ug via ORAL
  Filled 2016-07-21 (×3): qty 1

## 2016-07-21 MED ORDER — ACETAMINOPHEN 325 MG PO TABS
650.0000 mg | ORAL_TABLET | Freq: Once | ORAL | Status: AC
  Administered 2016-07-21: 650 mg via ORAL
  Filled 2016-07-21: qty 2

## 2016-07-21 MED ORDER — ONDANSETRON HCL 4 MG PO TABS: 4.0000 mg | ORAL_TABLET | Freq: Four times a day (QID) | ORAL | Status: DC | PRN

## 2016-07-21 MED ORDER — AMLODIPINE BESYLATE 5 MG PO TABS
5.0000 mg | ORAL_TABLET | Freq: Every day | ORAL | Status: DC
  Administered 2016-07-22: 5 mg via ORAL
  Filled 2016-07-21: qty 1

## 2016-07-21 MED ORDER — VITAMIN D 1000 UNITS PO TABS
1000.0000 [IU] | ORAL_TABLET | Freq: Every day | ORAL | Status: DC
  Administered 2016-07-21 – 2016-07-22 (×2): 1000 [IU] via ORAL
  Filled 2016-07-21 (×2): qty 1

## 2016-07-21 MED ORDER — SODIUM CHLORIDE 0.9 % IV SOLN
Freq: Once | INTRAVENOUS | Status: AC
  Administered 2016-07-21: 23:00:00 via INTRAVENOUS

## 2016-07-21 MED ORDER — ACETAMINOPHEN 325 MG PO TABS: 650.0000 mg | ORAL_TABLET | ORAL | Status: DC | PRN

## 2016-07-21 MED ORDER — ACETAMINOPHEN 325 MG PO TABS: 650.0000 mg | ORAL_TABLET | Freq: Four times a day (QID) | ORAL | Status: DC | PRN

## 2016-07-21 MED ORDER — DIPHENHYDRAMINE HCL 25 MG PO CAPS
25.0000 mg | ORAL_CAPSULE | Freq: Once | ORAL | Status: AC
  Administered 2016-07-21: 25 mg via ORAL
  Filled 2016-07-21: qty 1

## 2016-07-21 MED ORDER — SODIUM CHLORIDE 0.9 % IV SOLN: Freq: Once | INTRAVENOUS | Status: DC

## 2016-07-21 MED ORDER — ZOLPIDEM TARTRATE 5 MG PO TABS
5.0000 mg | ORAL_TABLET | Freq: Every evening | ORAL | Status: DC | PRN
  Administered 2016-07-21: 5 mg via ORAL
  Filled 2016-07-21: qty 1

## 2016-07-21 MED ORDER — ACETAMINOPHEN 650 MG RE SUPP: 650.0000 mg | Freq: Four times a day (QID) | RECTAL | Status: DC | PRN

## 2016-07-21 MED ORDER — ALPRAZOLAM 0.5 MG PO TABS: 0.5000 mg | ORAL_TABLET | Freq: Every evening | ORAL | Status: DC | PRN

## 2016-07-21 MED ORDER — CINACALCET HCL 30 MG PO TABS
60.0000 mg | ORAL_TABLET | Freq: Every day | ORAL | Status: DC
  Administered 2016-07-22: 60 mg via ORAL
  Filled 2016-07-21: qty 2

## 2016-07-21 NOTE — ED Notes (Signed)
Per Minette Headland in dialysis, patient is to go to dialysis first and then the floor.  Floor RN made aware he will be up there when he completes dialysis.

## 2016-07-21 NOTE — Progress Notes (Signed)
Post hd tx 

## 2016-07-21 NOTE — Progress Notes (Signed)
Subjective:   Patient known to [place] from previous admissions He was sent from dialysis for low Hgb Nephrology consulted to arrange for dialysis  Objective:  Vital signs in last 24 hours:  Temp:  [99 F (37.2 C)-100.5 F (38.1 C)] 100.5 F (38.1 C) (03/08 2038) Pulse Rate:  [84-109] 99 (03/08 2038) Resp:  [14-22] 16 (03/08 2038) BP: (116-156)/(63-122) 130/67 (03/08 2038) SpO2:  [84 %-100 %] 99 % (03/08 2038) Weight:  [127 kg (279 lb 15.8 oz)-127 kg (280 lb)] 127 kg (279 lb 15.8 oz) (03/08 1230)  Weight change:  Filed Weights   07/21/16 0908 07/21/16 1230  Weight: 127 kg (280 lb) 127 kg (279 lb 15.8 oz)    Intake/Output:    Intake/Output Summary (Last 24 hours) at 07/21/16 2048 Last data filed at 07/21/16 1627  Gross per 24 hour  Intake              560 ml  Output              800 ml  Net             -240 ml     Physical Exam: General: NAD, laying in bed  HEENT Anicteric, moist oral mucus membranes  Neck supple  Pulm/lungs Clear, normal effort  CVS/Heart Regular, no rub  Abdomen:  Soft, NT  Extremities: No edema  Neurologic: Alert, oreiented  Skin: No acute rashes  Access: IJ PC       Basic Metabolic Panel:   Recent Labs Lab 07/21/16 0915  NA 135  K 3.7  CL 100*  CO2 25  GLUCOSE 95  BUN 71*  CREATININE 12.34*  CALCIUM 7.2*     CBC:  Recent Labs Lab 07/19/16 1500 07/21/16 0915 07/21/16 1626  WBC  --  11.7*  --   NEUTROABS  --  8.8*  --   HGB 5.6* 4.7* 6.0*  HCT  --  14.9* 17.6*  MCV  --  90.3  --   PLT  --  222  --       Microbiology:  Recent Results (from the past 720 hour(s))  MRSA PCR Screening     Status: None   Collection Time: 06/30/16  9:42 PM  Result Value Ref Range Status   MRSA by PCR NEGATIVE NEGATIVE Final    Comment:        The GeneXpert MRSA Assay (FDA approved for NASAL specimens only), is one component of a comprehensive MRSA colonization surveillance program. It is not intended to diagnose MRSA infection  nor to guide or monitor treatment for MRSA infections.     Coagulation Studies: No results for input(s): LABPROT, INR in the last 72 hours.  Urinalysis: No results for input(s): COLORURINE, LABSPEC, PHURINE, GLUCOSEU, HGBUR, BILIRUBINUR, KETONESUR, PROTEINUR, UROBILINOGEN, NITRITE, LEUKOCYTESUR in the last 72 hours.  Invalid input(s): APPERANCEUR    Imaging: No results found.   Medications:    . sodium chloride   Intravenous Once  . sodium chloride   Intravenous Once  . acetaminophen  650 mg Oral Once  . allopurinol  100 mg Oral Daily  . amLODipine  5 mg Oral Daily  . calcitRIOL  0.5 mcg Oral Daily  . [START ON 07/22/2016] calcium acetate  2,668 mg Oral TID WC  . cholecalciferol  1,000 Units Oral Daily  . [START ON 07/22/2016] cinacalcet  60 mg Oral Q breakfast  . diphenhydrAMINE  25 mg Oral Once  . ferrous sulfate  325 mg Oral BID WC  .  pantoprazole  40 mg Oral BID  . [START ON 07/22/2016] sevelamer carbonate  1,600 mg Oral TID WC   acetaminophen **OR** acetaminophen, ALPRAZolam, ondansetron **OR** ondansetron (ZOFRAN) IV, zolpidem  Assessment/ Plan:  56 y.o. AA  male with chronic anemia, HTN, ESRD secondary to polycystic kidney disease ,  large ulcerative duodenal polyp that was removed.  H/o multiple blood transfusions  1. 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 Narda Amber dialysis [facility]TS [facility] nephrology - HD today  2. Anemia of CKD and blood loss;  Blood transfusion during HD today   3. SHPTH - monitor phosphorus level     LOS: 0 Feliz Lincoln 3/8/20188:48 PM

## 2016-07-21 NOTE — Progress Notes (Signed)
Post hemodialysis: Tolerated 3.5hours of treatment with 0.5liters net removal.Blood volume removed with treatment.Tolerated 2units packed red blood cells,no adverse effects noted.Post hemoglobin/hematcrit drawn at end of treatment,sent to lab.Dialysis catheter capped/clamped and lumens wrapped with gauze/taped.

## 2016-07-21 NOTE — Progress Notes (Signed)
Pre hd info 

## 2016-07-21 NOTE — Progress Notes (Signed)
Hd start 

## 2016-07-21 NOTE — Progress Notes (Signed)
Dr. Clayton Bibles was notified of troponin result this am that was 0.06. Order received.

## 2016-07-21 NOTE — ED Notes (Signed)
Troponin 0.06 reported to DR Reita Cliche in person.

## 2016-07-21 NOTE — Progress Notes (Signed)
Hemodialysis completed. 

## 2016-07-21 NOTE — Progress Notes (Signed)
Pre hd assessment  

## 2016-07-21 NOTE — Progress Notes (Signed)
This note also relates to the following rows which could not be included: Rate - Cannot attach notes to extension rows Line - Cannot attach notes to extension rows  Second unit Packed red blood cells started during dialysis.

## 2016-07-21 NOTE — ED Provider Notes (Signed)
[facility] Emergency Department Provider Note ____________________________________________   I have reviewed the triage vital signs and the triage nursing note.  HISTORY  Chief Complaint Abnormal Lab   Historian Patient  HPI Mustaf Antonacci is a 56 y.o. male presenting from dialysis after being told that his blood work yesterday showed anemia, hemoglobin 5.9 reportedly. Patient states that 2 days ago he did have small amount of black stool.   Patient states he's been feeling alright without chest pain or trouble breathing. He had his peritoneal dialysis catheter removed yesterday, no significant bleeding or swelling at that site. He had a right sided tunneled vascular catheter for dialysis placed about 2 weeks ago per the patient.  Patient states that he did have a blood transfusion a few weeks ago related to GI bleeding. He states that he had an endoscopy here and was referred to follow up as an outpatient with [facility]. He states that [facility] he was told that they would be waiting and till things healed up before doing some additional procedures.     Chart review:Several months ago he was diagnosed with GI bleeding, found to have a large duodenal mass. On review of thechart history, he did receive over 10 units of packed red blood cells.    Past Medical History:  Diagnosis Date  . Adenomatous duodenal polyp    jan 2018[facility]  . Hypertension   . Renal disorder     Patient Active Problem List   Diagnosis Date Noted  . Iron deficiency anemia due to chronic blood loss   . Other diseases of stomach and duodenum   . Chronic duodenal ulcer with hemorrhage   . GI bleed 06/30/2016  . Symptomatic anemia 06/30/2016    Past Surgical History:  Procedure Laterality Date  . ABDOMINAL SURGERY    . COLON SURGERY    . DIALYSIS/PERMA CATHETER INSERTION N/A 06/30/2016   Procedure: Dialysis/Perma Catheter Insertion;  Surgeon: Algernon Huxley, MD;  Location: [facility];   Service: Cardiovascular;  Laterality: N/A;  . ESOPHAGOGASTRODUODENOSCOPY (EGD) WITH PROPOFOL N/A 07/01/2016   Procedure: ESOPHAGOGASTRODUODENOSCOPY (EGD) WITH PROPOFOL;  Surgeon: Lucilla Lame, MD;  Location: [facility] ENDOSCOPY;  Service: Endoscopy;  Laterality: N/A;    Prior to Admission medications   Medication Sig Start Date End Date Taking? Authorizing Provider  acetaminophen (TYLENOL) 325 MG tablet Take 650 mg by mouth every 4 (four) hours as needed for pain. 06/15/16  Yes Historical Provider, MD  allopurinol (ZYLOPRIM) 100 MG tablet Take 100 mg by mouth daily. 01/09/16 01/08/17 Yes Historical Provider, MD  ALPRAZolam Duanne Moron) 0.5 MG tablet Take 0.5 mg by mouth at bedtime as needed. 06/24/16  Yes Historical Provider, MD  amLODipine (NORVASC) 5 MG tablet Take 5 mg by mouth daily. 06/15/16 06/15/17 Yes Historical Provider, MD  calcitRIOL (ROCALTROL) 0.5 MCG capsule Take 0.5 mcg by mouth daily. 06/20/16 06/20/17 Yes Historical Provider, MD  calcium acetate (PHOSLO) 667 MG capsule Take 2,668 mg by mouth 3 (three) times daily with meals.   Yes Historical Provider, MD  Cholecalciferol (VITAMIN D PO) Take 1 tablet by mouth daily.   Yes Historical Provider, MD  cinacalcet (SENSIPAR) 30 MG tablet Take 60 mg by mouth daily. 06/29/16 06/29/17 Yes Historical Provider, MD  ferrous sulfate 325 (65 FE) MG tablet Take 1 tablet (325 mg total) by mouth 2 (two) times daily with a meal. 07/03/16  Yes Vaughan Basta, MD  pantoprazole (PROTONIX) 40 MG tablet Take 1 tablet (40 mg total) by mouth  2 (two) times daily. 07/03/16 07/03/17 Yes Vaughan Basta, MD  sevelamer carbonate (RENVELA) 800 MG tablet Take 1,600 mg by mouth 3 (three) times daily as needed. 06/16/16  Yes Historical Provider, MD  zolpidem (AMBIEN) 5 MG tablet Take 1 tablet (5 mg total) by mouth at bedtime as needed for sleep. 07/03/16  Yes Vaughan Basta, MD  docusate sodium (COLACE) 100 MG capsule Take 1 capsule (100 mg total) by mouth 2 (two) times daily  as needed for mild constipation. Patient not taking: Reported on 07/21/2016 07/03/16   Vaughan Basta, MD    No Known Allergies  Family History  Problem Relation Age of Onset  . Renal Disease Maternal Uncle     Social History Social History  Substance Use Topics  . Smoking status: Never Smoker  . Smokeless tobacco: Never Used  . Alcohol use No    Review of Systems  Constitutional: Negative for fever. Eyes: Negative for visual changes. ENT: Negative for sore throat. Cardiovascular: Negative for chest pain. Respiratory: Negative for shortness of breath. Gastrointestinal: Negative for abdominal pain, vomiting and diarrhea. Genitourinary: Negative for dysuria. Musculoskeletal: Negative for back pain. Skin: Negative for rash. Neurological: Negative for headache. 10 point Review of Systems otherwise negative ____________________________________________   PHYSICAL EXAM:  VITAL SIGNS: ED Triage Vitals  Enc Vitals Group     BP 07/21/16 0907 (!) 149/93     Pulse Rate 07/21/16 0907 84     Resp 07/21/16 0907 20     Temp 07/21/16 0907 99.1 F (37.3 C)     Temp Source 07/21/16 0907 Oral     SpO2 07/21/16 0907 100 %     Weight 07/21/16 0908 280 lb (127 kg)     Height 07/21/16 0908 5\' 6"  (1.676 m)     Head Circumference --      Peak Flow --      Pain Score --      Pain Loc --      Pain Edu? --      Excl. in [place]? --      Constitutional: Alert and oriented. Well appearing and in no distress. HEENT   Head: Normocephalic and atraumatic.      Eyes: Conjunctivae are normal. PERRL. Normal extraocular movements.      Ears:         Nose: No congestion/rhinnorhea.   Mouth/Throat: Mucous membranes are moist.   Neck: No stridor. Cardiovascular/Chest: Normal rate, regular rhythm.  No murmurs, rubs, or gallops.  Right chest wall has a dialysis catheter in place. Respiratory: Normal respiratory effort without tachypnea nor retractions. Breath sounds are clear and equal  bilaterally. No wheezes/rales/rhonchi. Gastrointestinal: Soft. No distention, no guarding, no rebound. Nontender.   Very obese. Dressing over his peritoneal ialysis catheter removal site has a small amount of serosanguineous secretion on it. Genitourinary/rectal:Deferred Musculoskeletal: Nontender with normal range of motion in all extremities. No joint effusions.  No lower extremity tenderness.  No edema. Neurologic:  Normal speech and language. No gross or focal neurologic deficits are appreciated. Skin:  Skin is warm, dry and intact. No rash noted. Psychiatric: Mood and affect are normal. Speech and behavior are normal. Patient exhibits appropriate insight and judgment.   ____________________________________________  LABS (pertinent positives/negatives)  Labs Reviewed  CBC WITH DIFFERENTIAL/PLATELET - Abnormal; Notable for the following:       Result Value   WBC 11.7 (*)    RBC 1.65 (*)    Hemoglobin 4.7 (*)    HCT 14.9 (*)  MCHC 31.8 (*)    RDW 16.4 (*)    Neutro Abs 8.8 (*)    Monocytes Absolute 1.1 (*)    All other components within normal limits  BASIC METABOLIC PANEL - Abnormal; Notable for the following:    Chloride 100 (*)    BUN 71 (*)    Creatinine, Ser 12.34 (*)    Calcium 7.2 (*)    GFR calc non Af Amer 4 (*)    GFR calc Af Amer 5 (*)    All other components within normal limits  TROPONIN I - Abnormal; Notable for the following:    Troponin I 0.06 (*)    All other components within normal limits  TYPE AND SCREEN  PREPARE RBC (CROSSMATCH)    ____________________________________________    EKG I, Lisa Roca, MD, the attending physician have personally viewed and interpreted all ECGs.  113 bpm. Sinus tachycardia. Narrow QRS. Normal axis. Nonspecific ST and T-wave ____________________________________________  RADIOLOGY All Xrays were viewed by me. Imaging interpreted by  Radiologist.  None __________________________________________  PROCEDURES  Procedure(s) performed: None  Critical Care performed: CRITICAL CARE Performed by: Lisa Roca   Total critical care time: 30 minutes  Critical care time was exclusive of separately billable procedures and treating other patients.  Critical care was necessary to treat or prevent imminent or life-threatening deterioration.  Critical care was time spent personally by me on the following activities: development of treatment plan with patient and/or surrogate as well as nursing, discussions with consultants, evaluation of patient's response to treatment, examination of patient, obtaining history from patient or surrogate, ordering and performing treatments and interventions, ordering and review of laboratory studies, ordering and review of radiographic studies, pulse oximetry and re-evaluation of patient's condition.   ____________________________________________   ED COURSE / ASSESSMENT AND PLAN  Pertinent labs & imaging results that were available during my care of the patient were reviewed by me and considered in my medical decision making (see chart for details).     Mr. Tomkins presented with diagnosis of anemia which was confirmed on repeat testing here.  Although patient states he does not feel bad, or short of breath, is not hypoxic or hypotensive or having any reported active rectal bleeding, he is a little tachycardic.    Decision-making process for disposition was fairly complex in terms of my initial concern was to get him admitted up to a floor bed as soon as possible so that he could receive type-specific blood, as only type O blood is only available in the emergency department.  Patient was fairly poor historian, but ultimately through reviewing both our chart, as well as care everywhere, I was able to ascertain that he had large GI bleeding which was ultimately found to be due to duodenal mass  which was apparently partially resected and is being followed at [facility].  I spoke with the hospitalist in terms of making quick decision about disposition of transfer to [facility] versus admit to our hospital. After discussion with Dr. Allen Norris, and review of records Dr. Posey Pronto came to bedside and will admit here.  We discussed emergency release O blood vs. Type specific.  HR 102, but patient without symptoms.  Will  Hold off on emergency release and will try for dialysis within the hour where he can receive type specific blood.    CONSULTATIONS:   Dr. Serita Grit, hospitalist for admission who also spoke with Dr. Candiss Norse for dialysis orders.     Patient / Family / Caregiver informed  of clinical course, medical decision-making process, and agree with plan.   ___________________________________________   FINAL CLINICAL IMPRESSION(S) / ED DIAGNOSES   Final diagnoses:  Gastrointestinal hemorrhage, unspecified gastrointestinal hemorrhage type  Symptomatic anemia              Note: This dictation was prepared with Dragon dictation. Any transcriptional errors that result from this process are unintentional    Lisa Roca, MD 07/21/16 1058

## 2016-07-21 NOTE — ED Notes (Signed)
Patient denies pain and is resting comfortably.  

## 2016-07-21 NOTE — ED Triage Notes (Signed)
Pt to ed via acems from dialysis with reports of HGB 5.4. Pt did not have dialysis this am. Pt denies any pain at this time. Pt pale on arrival. Pt did have PD catheter removed yesterday with the bandage somewhat soiled.

## 2016-07-21 NOTE — ED Notes (Signed)
Per edp, pt will be transferred to floor to recve type specific blood transfusion. Pt in  No acute distress at this time, VSS.

## 2016-07-21 NOTE — H&P (Signed)
[facility] [place] NAME: Tristan Bender    MR#:  [id number]  DATE OF BIRTH:  1961-01-10  DATE OF ADMISSION:  07/21/2016  PRIMARY CARE PHYSICIAN: Franciso Bend, MD   REQUESTING/REFERRING PHYSICIAN: DR Reita Cliche  CHIEF COMPLAINT:  I have low hemoglobin count. By nephrologist asked me to come to the emergency room for blood transfusion.  HISTORY OF PRESENT ILLNESS:  Tristan Bender  is a 56 y.o. 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 Repeat hemoglobin was done at dialysis today and was 4.6 patient was asked by nephrology/[facility] to come to the emergency room for further evaluation and management.  Patient has been diagnosed with a duodenal polyp which was partially resected via endoscopy at [place] in January 2018. He in the process of getting their remaining duodenal polyp removed which is still pending. Comes in with hemoglobin of 4.7 from dialysis center in [place]. Patient has history of melanoma. He has a history of slow GI bleed.   He is being admitted for further evaluation of acute on chronic anemia secondary to chronic slow GI bleed in the setting of duodenal polyp/adenoma  PAST MEDICAL HISTORY:   Past Medical History:  Diagnosis Date  . Adenomatous duodenal polyp    jan 2018[facility]  . Hypertension   . Renal disorder     PAST SURGICAL HISTOIRY:   Past Surgical History:  Procedure Laterality Date  . ABDOMINAL SURGERY    . COLON SURGERY    . DIALYSIS/PERMA CATHETER INSERTION N/A 06/30/2016   Procedure: Dialysis/Perma Catheter Insertion;  Surgeon: Algernon Huxley, MD;  Location: [place] [facility];  Service: Cardiovascular;  Laterality: N/A;  . ESOPHAGOGASTRODUODENOSCOPY (EGD) WITH PROPOFOL N/A 07/01/2016   Procedure:  ESOPHAGOGASTRODUODENOSCOPY (EGD) WITH PROPOFOL;  Surgeon: Lucilla Lame, MD;  Location: [facility] ENDOSCOPY;  Service: Endoscopy;  Laterality: N/A;    SOCIAL HISTORY:   Social History  Substance Use Topics  . Smoking status: Never Smoker  . Smokeless tobacco: Never Used  . Alcohol use No    FAMILY HISTORY:   Family History  Problem Relation Age of Onset  . Renal Disease Maternal Uncle     DRUG ALLERGIES:  No Known Allergies  REVIEW OF SYSTEMS:  Review of Systems  Constitutional: Negative for chills, fever and weight loss.  HENT: Negative for ear discharge, ear pain and nosebleeds.   Eyes: Negative for blurred vision, pain and discharge.  Respiratory: Negative for sputum production, shortness of breath, wheezing and stridor.   Cardiovascular: Negative for chest pain, palpitations, orthopnea and PND.  Gastrointestinal: Negative for abdominal pain, diarrhea, nausea and vomiting.  Genitourinary: Negative for frequency and urgency.  Musculoskeletal: Negative for back pain and joint pain.  Neurological: Positive for weakness. Negative for sensory change, speech change and focal weakness.  Psychiatric/Behavioral: Negative for depression and hallucinations. The patient is not nervous/anxious.      MEDICATIONS AT HOME:   Prior to Admission medications   Medication Sig Start Date End Date Taking? Authorizing Provider  acetaminophen (TYLENOL) 325 MG tablet Take 650 mg by mouth every 4 (four) hours as needed for pain. 06/15/16  Yes Historical Provider, MD  allopurinol (ZYLOPRIM) 100 MG tablet Take 100 mg by mouth daily. 01/09/16 01/08/17 Yes Historical Provider, MD  ALPRAZolam (  XANAX) 0.5 MG tablet Take 0.5 mg by mouth at bedtime as needed. 06/24/16  Yes Historical Provider, MD  amLODipine (NORVASC) 5 MG tablet Take 5 mg by mouth daily. 06/15/16 06/15/17 Yes Historical Provider, MD  calcitRIOL (ROCALTROL) 0.5 MCG capsule Take 0.5 mcg by mouth daily. 06/20/16 06/20/17 Yes Historical Provider, MD   calcium acetate (PHOSLO) 667 MG capsule Take 2,668 mg by mouth 3 (three) times daily with meals.   Yes Historical Provider, MD  Cholecalciferol (VITAMIN D PO) Take 1 tablet by mouth daily.   Yes Historical Provider, MD  cinacalcet (SENSIPAR) 30 MG tablet Take 60 mg by mouth daily. 06/29/16 06/29/17 Yes Historical Provider, MD  ferrous sulfate 325 (65 FE) MG tablet Take 1 tablet (325 mg total) by mouth 2 (two) times daily with a meal. 07/03/16  Yes Vaughan Basta, MD  pantoprazole (PROTONIX) 40 MG tablet Take 1 tablet (40 mg total) by mouth 2 (two) times daily. 07/03/16 07/03/17 Yes Vaughan Basta, MD  sevelamer carbonate (RENVELA) 800 MG tablet Take 1,600 mg by mouth 3 (three) times daily as needed. 06/16/16  Yes Historical Provider, MD  zolpidem (AMBIEN) 5 MG tablet Take 1 tablet (5 mg total) by mouth at bedtime as needed for sleep. 07/03/16  Yes Vaughan Basta, MD      VITAL SIGNS:  Blood pressure (!) 137/122, pulse (!) 105, temperature 99.1 F (37.3 C), temperature source Oral, resp. rate 16, height 5\' 6"  (1.676 m), weight 127 kg (280 lb), SpO2 100 %.  PHYSICAL EXAMINATION:  GENERAL:  56 y.o.-year-old patient lying in the bed with no acute distress. Morbidly obese pallor  EYES: Pupils equal, round, reactive to light and accommodation. No scleral icterus. Extraocular muscles intact.  HEENT: Head atraumatic, normocephalic. Oropharynx and nasopharynx clear.  NECK:  Supple, no jugular venous distention. No thyroid enlargement, no tenderness.  LUNGS: Normal breath sounds bilaterally, no wheezing, rales,rhonchi or crepitation. No use of accessory muscles of respiration.  CARDIOVASCULAR: S1, S2 normal. No murmurs, rubs, or gallops.  ABDOMEN: Soft, nontender, nondistended. Bowel sounds present. No organomegaly or mass.  EXTREMITIES: No pedal edema, cyanosis, or clubbing.  NEUROLOGIC: Cranial nerves II through XII are intact. Muscle strength 5/5 in all extremities. Sensation intact.  Gait not checked.  PSYCHIATRIC: The patient is alert and oriented x 3.  SKIN: No obvious rash, lesion, or ulcer.   LABORATORY PANEL:   CBC  Recent Labs Lab 07/21/16 0915  WBC 11.7*  HGB 4.7*  HCT 14.9*  PLT 222   ------------------------------------------------------------------------------------------------------------------  Chemistries   Recent Labs Lab 07/21/16 0915  NA 135  K 3.7  CL 100*  CO2 25  GLUCOSE 95  BUN 71*  CREATININE 12.34*  CALCIUM 7.2*   ------------------------------------------------------------------------------------------------------------------  Cardiac Enzymes  Recent Labs Lab 07/21/16 0915  TROPONINI 0.06*   ------------------------------------------------------------------------------------------------------------------  RADIOLOGY:  No results found.  EKG:   Sinus tach  IMPRESSION AND PLAN:   Tristan Bender  is a 56 y.o. 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 Repeat hemoglobin was done at dialysis today and was 4.6 patient was asked by nephrology/[facility] to come to the emergency room for further evaluation and management.   1. Acute on chronic anemia secondary to end-stage renal disease and suspected slow GI bleed from duodenal adenoma -Admit to medical floor -Transfuse 2  units of blood transfusion at hemodialysis -Hemoglobin 5.7--- 4.6--2 units blood transfusion--- -History of melena. Patient is followed closely with [facility] for his duodenal adenoma which is partially resected and in the process of getting remaining resected down the road -I will hold off GI consult since all the workup is being done at [facility]  2. End-stage renal disease on hemodialysis -Patient was on peritoneal dialysis but now changed to HD -We'll get him  dialyzed today. Nephrology consultation placed. Case was discussed with Dr. Candiss Norse  3. Hypertension -Resume home meds  4. GERD/duodenal adenoma/slow GI bleed -PPI  5. DVT prophylaxis SCD teds in the setting of anemia   All the records are reviewed and case discussed with ED provider. Management plans discussed with the patient, family and they are in agreement.  CODE STATUS: FULL  TOTAL TIME TAKING CARE OF THIS PATIENT: 50 minutes.    Tristan Bender M.D on 07/21/2016 at 11:01 AM  Between 7am to 6pm - Pager - [phone]  After 6pm go to [email or web address] - password [email or web address] [facility]  [employer]  Office  [phone]  CC: Primary care physician; Franciso Bend, MD

## 2016-07-22 LAB — PREPARE RBC (CROSSMATCH)

## 2016-07-22 LAB — CBC
HEMATOCRIT: 19.3 % — AB (ref 40.0–52.0)
HEMOGLOBIN: 6.6 g/dL — AB (ref 13.0–18.0)
MCH: 30.3 pg (ref 26.0–34.0)
MCHC: 34.2 g/dL (ref 32.0–36.0)
MCV: 88.5 fL (ref 80.0–100.0)
Platelets: 173 10*3/uL (ref 150–440)
RBC: 2.18 MIL/uL — ABNORMAL LOW (ref 4.40–5.90)
RDW: 15.3 % — ABNORMAL HIGH (ref 11.5–14.5)
WBC: 9 10*3/uL (ref 3.8–10.6)

## 2016-07-22 LAB — HEMOGLOBIN AND HEMATOCRIT, BLOOD
HEMATOCRIT: 25 % — AB (ref 40.0–52.0)
HEMOGLOBIN: 8.3 g/dL — AB (ref 13.0–18.0)

## 2016-07-22 LAB — FERRITIN: Ferritin: 244 ng/mL (ref 24–336)

## 2016-07-22 LAB — GLUCOSE, CAPILLARY: GLUCOSE-CAPILLARY: 85 mg/dL (ref 65–99)

## 2016-07-22 MED ORDER — SODIUM CHLORIDE 0.9 % IV SOLN: Freq: Once | INTRAVENOUS | Status: DC

## 2016-07-22 NOTE — Discharge Summary (Signed)
[place] [facility] [place] NAME: Tristan Bender    MR#:  [id number]  DATE OF BIRTH:  January 09, 1961  DATE OF ADMISSION:  07/21/2016 ADMITTING PHYSICIAN: Fritzi Mandes, MD  DATE OF DISCHARGE: 07/22/2016  PRIMARY CARE PHYSICIAN: Franciso Bend, MD    ADMISSION DIAGNOSIS:  Symptomatic anemia [D64.9] Gastrointestinal hemorrhage, unspecified gastrointestinal hemorrhage type [K92.2]  DISCHARGE DIAGNOSIS:  Active Problems:   Anemia   SECONDARY DIAGNOSIS:   Past Medical History:  Diagnosis Date  . Adenomatous duodenal polyp    jan 2018[facility]  . Hypertension   . Renal disorder     HOSPITAL COURSE:   1. Acute on chronic blood loss anemia with slow GI bleed from duodenal adenoma. Patient refused transfer to [facility] for definitive treatment. Patient receiving his fifth unit of blood. Hemoglobin after 4 units came up to 8.3. Hemoglobin when he came in for 4.7.  Last month had the same issue where he was transfused quite a few units of blood. 2. End-stage renal disease on dialysis. Received dialysis yesterday and again today 3. Essential hypertension continue usual medication 4. GERD and duodenal adenoma with slow GI bleed on PPI   DISCHARGE CONDITIONS:   Fair  CONSULTS OBTAINED:  Treatment Team:  Murlean Iba, MD  DRUG ALLERGIES:  No Known Allergies  DISCHARGE MEDICATIONS:   Current Discharge Medication List    CONTINUE these medications which have NOT CHANGED   Details  acetaminophen (TYLENOL) 325 MG tablet Take 650 mg by mouth every 4 (four) hours as needed for pain.    allopurinol (ZYLOPRIM) 100 MG tablet Take 100 mg by mouth daily.    ALPRAZolam (XANAX) 0.5 MG tablet Take 0.5 mg by mouth at bedtime as needed. Refills: 0    amLODipine (NORVASC) 5 MG tablet Take 5 mg by mouth daily.    calcitRIOL (ROCALTROL) 0.5 MCG capsule Take 0.5 mcg by mouth daily.    calcium acetate (PHOSLO) 667 MG capsule Take 2,668 mg by mouth 3 (three) times daily  with meals.    Cholecalciferol (VITAMIN D PO) Take 1 tablet by mouth daily.    cinacalcet (SENSIPAR) 30 MG tablet Take 60 mg by mouth daily.    ferrous sulfate 325 (65 FE) MG tablet Take 1 tablet (325 mg total) by mouth 2 (two) times daily with a meal. Qty: 60 tablet, Refills: 3    pantoprazole (PROTONIX) 40 MG tablet Take 1 tablet (40 mg total) by mouth 2 (two) times daily. Qty: 60 tablet, Refills: 11    sevelamer carbonate (RENVELA) 800 MG tablet Take 1,600 mg by mouth 3 (three) times daily as needed. Refills: 2    zolpidem (AMBIEN) 5 MG tablet Take 1 tablet (5 mg total) by mouth at bedtime as needed for sleep. Qty: 15 tablet, Refills: 0         DISCHARGE INSTRUCTIONS:   Follow-up with nephrology. Will need referral to [facility] surgery for removal of this polyp that keeps bleeding.  If you experience worsening of your admission symptoms, develop shortness of breath, life threatening emergency, suicidal or homicidal thoughts you must seek medical attention immediately by calling 911 or calling your MD immediately  if symptoms less severe.  You Must read complete instructions/literature along with all the possible adverse reactions/side effects for all the Medicines you take and that have been prescribed to you. Take any new Medicines after you have completely understood and accept all the possible adverse reactions/side effects.   Please note  You  were cared for by a hospitalist during your hospital stay. If you have any questions about your discharge medications or the care you received while you were in the hospital after you are discharged, you can call the unit and asked to speak with the hospitalist on call if the hospitalist that took care of you is not available. Once you are discharged, your primary care physician will handle any further medical issues. Please note that NO REFILLS for any discharge medications will be authorized once you are discharged, as it is imperative that  you return to your primary care physician (or establish a relationship with a primary care physician if you do not have one) for your aftercare needs so that they can reassess your need for medications and monitor your lab values.    Today   CHIEF COMPLAINT:   Chief Complaint  Patient presents with  . Abnormal Lab    HISTORY OF PRESENT ILLNESS:  Tristan Bender  is a 56 y.o. male sent in for anemia   VITAL SIGNS:  Blood pressure (!) 155/95, pulse 86, temperature 98.8 F (37.1 C), temperature source Oral, resp. rate 19, height 5\' 6"  (1.676 m), weight 127 kg (279 lb 15.8 oz), SpO2 98 %.   PHYSICAL EXAMINATION:  GENERAL:  56 y.o.-year-old patient lying in the bed with no acute distress.  EYES: Pupils equal, round, reactive to light and accommodation. No scleral icterus. Extraocular muscles intact.  HEENT: Head atraumatic, normocephalic. Oropharynx and nasopharynx clear.  NECK:  Supple, no jugular venous distention. No thyroid enlargement, no tenderness.  LUNGS: Normal breath sounds bilaterally, no wheezing, rales,rhonchi or crepitation. No use of accessory muscles of respiration.  CARDIOVASCULAR: S1, S2 normal. No murmurs, rubs, or gallops.  ABDOMEN: Soft, non-tender, non-distended. Bowel sounds present. No organomegaly or mass.  EXTREMITIES: No pedal edema, cyanosis, or clubbing.  NEUROLOGIC: Cranial nerves II through XII are intact. Muscle strength 5/5 in all extremities. Sensation intact. Gait not checked.  PSYCHIATRIC: The patient is alert and oriented x 3.  SKIN: No obvious rash, lesion, or ulcer.   DATA REVIEW:   CBC  Recent Labs Lab 07/22/16 0308 07/22/16 0929  WBC 9.0  --   HGB 6.6* 8.3*  HCT 19.3* 25.0*  PLT 173  --     Chemistries   Recent Labs Lab 07/21/16 0915  NA 135  K 3.7  CL 100*  CO2 25  GLUCOSE 95  BUN 71*  CREATININE 12.34*  CALCIUM 7.2*    Cardiac Enzymes  Recent Labs Lab 07/21/16 2016  TROPONINI 0.07*    Microbiology Results   Results for orders placed or performed during the hospital encounter of 06/30/16  MRSA PCR Screening     Status: None   Collection Time: 06/30/16  9:42 PM  Result Value Ref Range Status   MRSA by PCR NEGATIVE NEGATIVE Final    Comment:        The GeneXpert MRSA Assay (FDA approved for NASAL specimens only), is one component of a comprehensive MRSA colonization surveillance program. It is not intended to diagnose MRSA infection nor to guide or monitor treatment for MRSA infections.      Management plans discussed with the patient, family and they are in agreement.  CODE STATUS:     Code Status Orders        Start     Ordered   07/21/16 1717  Full code  Continuous     07/21/16 1716    Code Status History  Date Active Date Inactive Code Status Order ID Comments User Context   06/30/2016  5:05 PM 07/03/2016  6:15 PM Full Code [id number]  Vaughan Basta, MD Inpatient      TOTAL TIME TAKING CARE OF THIS PATIENT: 35 minutes.    Loletha Grayer M.D on 07/22/2016 at 2:12 PM  Between 7am to 6pm - Pager - [phone]  After 6pm go to [email or web address] - password [email or web address]  [facility] Office  [phone]  CC: Primary care physician; Franciso Bend, MD

## 2016-07-22 NOTE — Progress Notes (Signed)
PRE DIALYSIS ASSESSMENT 

## 2016-07-22 NOTE — Progress Notes (Signed)
HD STARTED  

## 2016-07-22 NOTE — Progress Notes (Signed)
IV's were removed. Discharge instructions, follow-up appointments, and prescriptions were provided to the pt. All questions were answered. The pt is waiting for his ride to transport home.

## 2016-07-22 NOTE — Progress Notes (Signed)
Subjective:   Patient known to [place] from previous admissions Doing better Getting ready to eat lunch Denies SOB  Objective:  Vital signs in last 24 hours:  Temp:  [98 F (36.7 C)-100.5 F (38.1 C)] 99.3 F (37.4 C) (03/09 1515) Pulse Rate:  [76-109] 76 (03/09 1515) Resp:  [16-20] 18 (03/09 1515) BP: (106-181)/(56-109) 181/109 (03/09 1515) SpO2:  [84 %-100 %] 99 % (03/09 1515)  Weight change:  Filed Weights   07/21/16 0908 07/21/16 1230  Weight: 127 kg (280 lb) 127 kg (279 lb 15.8 oz)    Intake/Output:    Intake/Output Summary (Last 24 hours) at 07/22/16 1536 Last data filed at 07/22/16 0904  Gross per 24 hour  Intake             1665 ml  Output             1200 ml  Net              465 ml     Physical Exam: General: NAD, laying in bed  HEENT Anicteric, moist oral mucus membranes  Neck supple  Pulm/lungs Clear, normal effort  CVS/Heart Regular, no rub  Abdomen:  Soft, NT  Extremities: No edema  Neurologic: Alert, oreiented  Skin: No acute rashes  Access: IJ PC       Basic Metabolic Panel:   Recent Labs Lab 07/21/16 0915  NA 135  K 3.7  CL 100*  CO2 25  GLUCOSE 95  BUN 71*  CREATININE 12.34*  CALCIUM 7.2*     CBC:  Recent Labs Lab 07/19/16 1500 07/21/16 0915 07/21/16 1626 07/22/16 0308 07/22/16 0929  WBC  --  11.7*  --  9.0  --   NEUTROABS  --  8.8*  --   --   --   HGB 5.6* 4.7* 6.0* 6.6* 8.3*  HCT  --  14.9* 17.6* 19.3* 25.0*  MCV  --  90.3  --  88.5  --   PLT  --  222  --  173  --       Microbiology:  Recent Results (from the past 720 hour(s))  MRSA PCR Screening     Status: None   Collection Time: 06/30/16  9:42 PM  Result Value Ref Range Status   MRSA by PCR NEGATIVE NEGATIVE Final    Comment:        The GeneXpert MRSA Assay (FDA approved for NASAL specimens only), is one component of a comprehensive MRSA colonization surveillance program. It is not intended to diagnose MRSA infection nor to guide or monitor  treatment for MRSA infections.     Coagulation Studies: No results for input(s): LABPROT, INR in the last 72 hours.  Urinalysis: No results for input(s): COLORURINE, LABSPEC, PHURINE, GLUCOSEU, HGBUR, BILIRUBINUR, KETONESUR, PROTEINUR, UROBILINOGEN, NITRITE, LEUKOCYTESUR in the last 72 hours.  Invalid input(s): APPERANCEUR    Imaging: No results found.   Medications:    . sodium chloride   Intravenous Once  . sodium chloride   Intravenous Once  . allopurinol  100 mg Oral Daily  . amLODipine  5 mg Oral Daily  . calcitRIOL  0.5 mcg Oral Daily  . calcium acetate  2,668 mg Oral TID WC  . cholecalciferol  1,000 Units Oral Daily  . cinacalcet  60 mg Oral Q breakfast  . ferrous sulfate  325 mg Oral BID WC  . pantoprazole  40 mg Oral BID  . sevelamer carbonate  1,600 mg Oral TID WC   acetaminophen **  OR** acetaminophen, ALPRAZolam, ondansetron **OR** ondansetron (ZOFRAN) IV, zolpidem  Assessment/ Plan:  56 y.o. AA  male with chronic anemia, HTN, ESRD secondary to polycystic kidney disease ,  large ulcerative duodenal polyp that was removed.  H/o multiple blood transfusions  1. 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 - [place] [facility] nephrology - Extra HD today to prevent hyperkalemia from multiple transfusions  2. Anemia of CKD and blood loss;  Blood transfusion during HD today   3. SHPTH - monitor phosphorus level  4. GI bleed Patient has bleeding from duodenal polyp Declined transfer to [facility] at present     LOS: 1 Maelynn Moroney 3/9/20183:36 PM

## 2016-07-22 NOTE — Care Management (Signed)
Elvera Bicker HD liaison notified of HD admission and plan for discharge.

## 2016-07-22 NOTE — Progress Notes (Signed)
Hd completed 

## 2016-07-22 NOTE — Discharge Instructions (Signed)
Anemia, Nonspecific Anemia is a condition in which the concentration of red blood cells or hemoglobin in the blood is below normal. Hemoglobin is a substance in red blood cells that carries oxygen to the tissues of the body. Anemia results in not enough oxygen reaching these tissues. What are the causes? Common causes of anemia include:  Excessive bleeding. Bleeding may be internal or external. This includes excessive bleeding from periods (in women) or from the intestine.  Poor nutrition.  Chronic kidney, thyroid, and liver disease.  Bone marrow disorders that decrease red blood cell production.  Cancer and treatments for cancer.  HIV, AIDS, and their treatments.  Spleen problems that increase red blood cell destruction.  Blood disorders.  Excess destruction of red blood cells due to infection, medicines, and autoimmune disorders. What are the signs or symptoms?  Minor weakness.  Dizziness.  Headache.  Palpitations.  Shortness of breath, especially with exercise.  Paleness.  Cold sensitivity.  Indigestion.  Nausea.  Difficulty sleeping.  Difficulty concentrating. Symptoms may occur suddenly or they may develop slowly. How is this diagnosed? Additional blood tests are often needed. These help your health care provider determine the best treatment. Your health care provider will check your stool for blood and look for other causes of blood loss. How is this treated? Treatment varies depending on the cause of the anemia. Treatment can include:  Supplements of iron, vitamin B12, or folic acid.  Hormone medicines.  A blood transfusion. This may be needed if blood loss is severe.  Hospitalization. This may be needed if there is significant continual blood loss.  Dietary changes.  Spleen removal. Follow these instructions at home: Keep all follow-up appointments. It often takes many weeks to correct anemia, and having your health care provider check on your  condition and your response to treatment is very important. Get help right away if:  You develop extreme weakness, shortness of breath, or chest pain.  You become dizzy or have trouble concentrating.  You develop heavy vaginal bleeding.  You develop a rash.  You have bloody or black, tarry stools.  You faint.  You vomit up blood.  You vomit repeatedly.  You have abdominal pain.  You have a fever or persistent symptoms for more than 2-3 days.  You have a fever and your symptoms suddenly get worse.  You are dehydrated. This information is not intended to replace advice given to you by your health care provider. Make sure you discuss any questions you have with your health care provider. Document Released: 06/09/2004 Document Revised: 10/14/2015 Document Reviewed: 10/26/2012 Elsevier Interactive Patient Education  2017 Elsevier Inc.  

## 2016-07-22 NOTE — Progress Notes (Signed)
MD notified of pt HGB. Will administer 2nd unit of blood.

## 2016-07-22 NOTE — Progress Notes (Signed)
Pre-hd tx 

## 2016-07-22 NOTE — Progress Notes (Signed)
Placed pt on Bipap per his home settings.

## 2016-07-22 NOTE — Progress Notes (Signed)
Post dialysis assessment 

## 2016-07-24 LAB — TYPE AND SCREEN
ABO/RH(D): A POS
Antibody Screen: NEGATIVE
UNIT DIVISION: 0
UNIT DIVISION: 0
UNIT DIVISION: 0
UNIT DIVISION: 0
UNIT DIVISION: 0
Unit division: 0
Unit division: 0
Unit division: 0
Unit division: 0

## 2016-07-24 LAB — BPAM RBC
BLOOD PRODUCT EXPIRATION DATE: 201803272359
BLOOD PRODUCT EXPIRATION DATE: 201803282359
BLOOD PRODUCT EXPIRATION DATE: 201803292359
Blood Product Expiration Date: 201803142359
Blood Product Expiration Date: 201803142359
Blood Product Expiration Date: 201803172359
Blood Product Expiration Date: 201803282359
Blood Product Expiration Date: 201803282359
Blood Product Expiration Date: 201803292359
ISSUE DATE / TIME: 201803081300
ISSUE DATE / TIME: 201803081331
ISSUE DATE / TIME: 201803081415
ISSUE DATE / TIME: 201803082230
ISSUE DATE / TIME: 201803082327
ISSUE DATE / TIME: 201803090420
ISSUE DATE / TIME: 201803091508
UNIT TYPE AND RH: 5100
UNIT TYPE AND RH: 6200
Unit Type and Rh: 5100
Unit Type and Rh: 5100
Unit Type and Rh: 6200
Unit Type and Rh: 6200
Unit Type and Rh: 6200
Unit Type and Rh: 6200
Unit Type and Rh: 6200

## 2016-09-09 DIAGNOSIS — K635 Polyp of colon: Secondary | ICD-10-CM | POA: Insufficient documentation

## 2016-09-09 HISTORY — DX: Polyp of colon: K63.5

## 2016-09-22 ENCOUNTER — Other Ambulatory Visit
Admission: RE | Admit: 2016-09-22 | Discharge: 2016-09-22 | Disposition: A | Discharge status: Home / Self Care | Payer: BLUE CROSS/BLUE SHIELD | Source: Ambulatory Visit | Attending: Nephrology | Admitting: Nephrology

## 2016-09-22 DIAGNOSIS — D631 Anemia in chronic kidney disease: Secondary | ICD-10-CM | POA: Insufficient documentation

## 2016-09-22 LAB — HEMOGLOBIN: Hemoglobin: 6.7 g/dL — ABNORMAL LOW (ref 13.0–18.0)

## 2017-05-08 ENCOUNTER — Other Ambulatory Visit
Admission: RE | Admit: 2017-05-08 | Discharge: 2017-05-08 | Disposition: A | Discharge status: Home / Self Care | Payer: BLUE CROSS/BLUE SHIELD | Source: Other Acute Inpatient Hospital | Attending: Nephrology | Admitting: Nephrology

## 2017-05-08 DIAGNOSIS — E876 Hypokalemia: Secondary | ICD-10-CM | POA: Insufficient documentation

## 2017-05-08 LAB — POTASSIUM: Potassium: 5.3 mmol/L — ABNORMAL HIGH (ref 3.5–5.1)

## 2019-03-11 DIAGNOSIS — Z6841 Body Mass Index (BMI) 40.0 and over, adult: Secondary | ICD-10-CM | POA: Insufficient documentation

## 2019-03-27 DIAGNOSIS — R200 Anesthesia of skin: Secondary | ICD-10-CM | POA: Insufficient documentation

## 2019-10-29 DIAGNOSIS — Z9989 Dependence on other enabling machines and devices: Secondary | ICD-10-CM | POA: Insufficient documentation

## 2019-10-29 DIAGNOSIS — G4733 Obstructive sleep apnea (adult) (pediatric): Secondary | ICD-10-CM | POA: Insufficient documentation

## 2020-03-09 ENCOUNTER — Ambulatory Visit (INDEPENDENT_AMBULATORY_CARE_PROVIDER_SITE_OTHER): Payer: Medicare Other | Admitting: Internal Medicine

## 2020-03-09 VITALS — BP 102/72 | HR 77 | Ht 67.0 in | Wt 290.0 lb

## 2020-03-09 DIAGNOSIS — I10 Essential (primary) hypertension: Secondary | ICD-10-CM

## 2020-03-09 DIAGNOSIS — G2581 Restless legs syndrome: Secondary | ICD-10-CM | POA: Diagnosis not present

## 2020-03-09 DIAGNOSIS — Z7189 Other specified counseling: Secondary | ICD-10-CM | POA: Diagnosis not present

## 2020-03-09 DIAGNOSIS — G4733 Obstructive sleep apnea (adult) (pediatric): Secondary | ICD-10-CM

## 2020-03-09 NOTE — Progress Notes (Signed)
Sleep Medicine   Office Visit  Patient Name: Tristan Bender DOB: 09-04-1960 MRN [id number]    Chief Complaint: study results/ sleep apnea  Brief History:  Jefferey is seen for initial consultation to discuss the results of his recent sleep studies demonstrating very severe obstructive sleep apnea.  BiPAP was recommended. Very frquent  PLMS were also observed despite treatment with Requip. He presents with a 15 year history of sleep apnea. He current machine has been broken for 6 months.  He is sleeping very poorly now without his CPAP.  His RLS has been very bad. This has been worse since he has been unable to use the CPAP. He is napping more now. The Epworth Sleepiness Score is 11 out of 24 .     ROS  General: (-) fever, (-) chills, (-) night sweat Nose and Sinuses: (-) nasal stuffiness or itchiness, (-) postnasal drip, (-) nosebleeds, (-) sinus trouble. Mouth and Throat: (-) sore throat, (-) hoarseness. Neck: (-) swollen glands, (-) enlarged thyroid, (-) neck pain. Respiratory: - cough, - shortness of breath, - wheezing. Neurologic: - numbness, - tingling. Psychiatric: - anxiety, - depression Sleep behavior: -sleep paralysis -hypnogogic hallucinations -dream enactment      -vivid dreams -cataplexy -night terrors -sleep walking   Current Medication: Outpatient Encounter Medications as of 03/09/2020  Medication Sig  . alprostadil (MUSE) 1000 MCG pellet Place into the urethra.  . Heparin, Porcine, in NaCl 1000-0.9 UT/500ML-% SOLN Heparin Sodium (Porcine) 1,000 Units/mL Systemic  . midodrine (PROAMATINE) 10 MG tablet Take by mouth.  . pregabalin (LYRICA) 50 MG capsule Take by mouth.  Marland Kitchen acetaminophen (TYLENOL) 325 MG tablet Take 650 mg by mouth every 4 (four) hours as needed for pain.  Marland Kitchen allopurinol (ZYLOPRIM) 100 MG tablet Take 100 mg by mouth daily.  Marland Kitchen ALPRAZolam (XANAX) 0.5 MG tablet Take 0.5 mg by mouth at bedtime as needed.  . calcium acetate (PHOSLO) 667 MG capsule Take 2,668  mg by mouth 3 (three) times daily with meals.  . Cholecalciferol (VITAMIN D PO) Take 1 tablet by mouth daily.  . cinacalcet (SENSIPAR) 60 MG tablet Take by mouth.  . ferrous sulfate 325 (65 FE) MG tablet Take 1 tablet (325 mg total) by mouth 2 (two) times daily with a meal.  . lidocaine-prilocaine (EMLA) cream SMARTSIG:Sparingly Topical  . rOPINIRole (REQUIP) 0.5 MG tablet Take by mouth.  . rosuvastatin (CRESTOR) 20 MG tablet Take 20 mg by mouth at bedtime.  . sevelamer carbonate (RENVELA) 800 MG tablet Take 1,600 mg by mouth 3 (three) times daily as needed.  . sildenafil (VIAGRA) 50 MG tablet Take 50 mg by mouth at bedtime.  Marland Kitchen zolpidem (AMBIEN) 5 MG tablet Take 1 tablet (5 mg total) by mouth at bedtime as needed for sleep.  . [DISCONTINUED] amLODipine (NORVASC) 5 MG tablet Take 5 mg by mouth daily.  . [DISCONTINUED] pantoprazole (PROTONIX) 40 MG tablet Take 1 tablet (40 mg total) by mouth 2 (two) times daily.   No facility-administered encounter medications on file as of 03/09/2020.    Surgical History: Past Surgical History:  Procedure Laterality Date  . ABDOMINAL SURGERY    . COLON SURGERY    . DIALYSIS/PERMA CATHETER INSERTION N/A 06/30/2016   Procedure: Dialysis/Perma Catheter Insertion;  Surgeon: Algernon Huxley, MD;  Location: [place] [facility];  Service: Cardiovascular;  Laterality: N/A;  . ESOPHAGOGASTRODUODENOSCOPY (EGD) WITH PROPOFOL N/A 07/01/2016   Procedure: ESOPHAGOGASTRODUODENOSCOPY (EGD) WITH PROPOFOL;  Surgeon: Lucilla Lame, MD;  Location: [facility] ENDOSCOPY;  Service:  Endoscopy;  Laterality: N/A;    Medical History: Past Medical History:  Diagnosis Date  . Adenomatous duodenal polyp    jan 2018[facility]  . Hypertension   . Renal disorder     Family History: Non contributory to the present illness  Social History: Social History   Socioeconomic History  . Marital status: Divorced    Spouse name: Not on file  . Number of children: Not on file  . Years of education:  Not on file  . Highest education level: Not on file  Occupational History  . Not on file  Tobacco Use  . Smoking status: Never Smoker  . Smokeless tobacco: Never Used  Substance and Sexual Activity  . Alcohol use: No  . Drug use: No  . Sexual activity: Yes  Other Topics Concern  . Not on file  Social History Narrative  . Not on file   Social Determinants of Health   Financial Resource Strain:   . Difficulty of Paying Living Expenses: Not on file  Food Insecurity:   . Worried About Charity fundraiser in the Last Year: Not on file  . Ran Out of Food in the Last Year: Not on file  Transportation Needs:   . Lack of Transportation (Medical): Not on file  . Lack of Transportation (Non-Medical): Not on file  Physical Activity:   . Days of Exercise per Week: Not on file  . Minutes of Exercise per Session: Not on file  Stress:   . Feeling of Stress : Not on file  Social Connections:   . Frequency of Communication with Friends and Family: Not on file  . Frequency of Social Gatherings with Friends and Family: Not on file  . Attends Religious Services: Not on file  . Active Member of Clubs or Organizations: Not on file  . Attends Archivist Meetings: Not on file  . Marital Status: Not on file  Intimate Partner Violence:   . Fear of Current or Ex-Partner: Not on file  . Emotionally Abused: Not on file  . Physically Abused: Not on file  . Sexually Abused: Not on file    Vital Signs: Blood pressure 102/72, pulse 77, height 5\' 7"  (1.702 m), weight 290 lb (131.5 kg), SpO2 100 %.  Examination: General Appearance: The patient is well-developed, well-nourished, and in no distress. Neck Circumference: 47 Skin: Gross inspection of skin unremarkable. Head: normocephalic, no gross deformities. Eyes: no gross deformities noted. ENT: ears appear grossly normal Neurologic: Alert and oriented. No involuntary movements.    EPWORTH SLEEPINESS SCALE:  Scale:  (0)= no chance  of dozing; (1)= slight chance of dozing; (2)= moderate chance of dozing; (3)= high chance of dozing  Chance  Situtation    Sitting and reading: 0    Watching TV: 3    Sitting Inactive in public: 1    As a passenger in car: 2      Lying down to rest: 3    Sitting and talking: 0    Sitting quielty after lunch: 1    In a car, stopped in traffic: 1   TOTAL SCORE:   11 out of 24    SLEEP STUDIES:  1. PSG 02/14/20 AHI 103 SpO40min 65%   LABS: No results found for this or any previous visit (from the past 2160 hour(s)).  Radiology: No results found.  No results found.  No results found.    Assessment and Plan: Patient Active Problem List   Diagnosis Date  Noted  . Anemia 07/21/2016  . Iron deficiency anemia due to chronic blood loss   . Other diseases of stomach and duodenum   . Chronic duodenal ulcer with hemorrhage   . GI bleed 06/30/2016  . Symptomatic anemia 06/30/2016     PLAN OSA:   The study results, diagnosis and treatment recommendations were reviewed with the patient.  The patient has very severe obstructive sleep apnea. BiPAP  21/16 is recommended. Patient has a history of RLS and the symptoms have become very severe off of CPAP". They have now augmented as he is on the "max dose . We will get his machine approved ASAP so he can restart.  Suggest:BiPAP 21/16 cm H2O to treat the patient's sleep disordered breathing.       1. OSA- restart BiPAP ASAP follow up in 30+days 2. RLS- should improve with restarting BiPAP will address at follow up. 3. HTN - BP and HR well controlled today in clinic. Followed and managed by PCP. 4. CPAP couseling-Discussed importance of adequate CPAP use as well as proper care and cleaning techniques of machine and all supplies.  General Counseling: I have discussed the findings of the evaluation and examination with Belenda Cruise.  I have also discussed any further diagnostic evaluation thatmay be needed or ordered today. Josel  verbalizes understanding of the findings of todays visit. We also reviewed his medications today and discussed drug interactions and side effects including but not limited excessive drowsiness and altered mental states. We also discussed that there is always a risk not just to him but also people around him. he has been encouraged to call the office with any questions or concerns that should arise related to todays visit.  This patient was seen by Shannan Harper, AGNP-C in collaboration with Dr. Devona Konig as a part of collaborative care agreement.       I have personally obtained a history, evaluated the patient, evaluated pertinent data, formulated the assessment and plan and placed orders.   Richelle Ito Saunders Glance, PhD, FAASM  Diplomate, American Board of Sleep Medicine    Allyne Gee, MD [facility] Diplomate ABMS Pulmonary and Critical Care Medicine Sleep medicine

## 2020-03-09 NOTE — Patient Instructions (Signed)

## 2020-03-10 ENCOUNTER — Encounter: Payer: Self-pay | Admitting: Internal Medicine

## 2020-04-13 ENCOUNTER — Ambulatory Visit: Payer: Medicare Other | Admitting: Internal Medicine

## 2020-04-13 NOTE — Progress Notes (Signed)
Patient was no-show for appointment.  The office staff will contact the patient for rescheduling follow-up. 

## 2020-05-14 DIAGNOSIS — Z9884 Bariatric surgery status: Secondary | ICD-10-CM | POA: Insufficient documentation

## 2020-05-18 ENCOUNTER — Ambulatory Visit (INDEPENDENT_AMBULATORY_CARE_PROVIDER_SITE_OTHER): Payer: Medicare Other | Admitting: Internal Medicine

## 2020-05-18 VITALS — BP 120/63 | HR 86 | Ht 67.0 in | Wt 254.0 lb

## 2020-05-18 DIAGNOSIS — G4733 Obstructive sleep apnea (adult) (pediatric): Secondary | ICD-10-CM | POA: Diagnosis not present

## 2020-05-18 DIAGNOSIS — N186 End stage renal disease: Secondary | ICD-10-CM

## 2020-05-18 DIAGNOSIS — Z7189 Other specified counseling: Secondary | ICD-10-CM | POA: Diagnosis not present

## 2020-05-18 DIAGNOSIS — Z992 Dependence on renal dialysis: Secondary | ICD-10-CM

## 2020-05-18 DIAGNOSIS — G2581 Restless legs syndrome: Secondary | ICD-10-CM

## 2020-05-18 NOTE — Patient Instructions (Addendum)

## 2020-05-18 NOTE — Progress Notes (Signed)
[facility] [place], [place] [address]  Pulmonary Sleep Medicine   Office Visit Note  Patient Name: Tristan Bender DOB: 1960-07-21 MRN [id number]    Chief Complaint: Obstructive Sleep Apnea visit  Brief History:  Tristan Bender is seen today for follow up The patient has an 11 year history of sleep apnea. Patient is using PAP nightly but use time is limited. He removes the mask due to a sticky mouth. This has improved a bit with increasing the humidity.  He goes to bed at midnight  But has difficulty initiating sleep. He wakes multiple times. He has to be up at 5 a.m. for dialysis and sleeps to 8 other days.  The patient feels more rested after sleeping with PAP.  The patient reports benefiting from PAP use. Reported sleepiness is  improved and the Epworth Sleepiness Score is 4 out of 24. The patient does take naps at dialysis. The patient's wife complains of the following: snoring  The compliance download shows very poor compliance with an average use time of 2.6 hours. The AHI is 24  The patient does not complain of limb movements disrupting sleep. The patient has a history of restless leg syndrome. He is taking 1.5 mg ropinerole bid. qam and q 4p.m.  ROS  General: (-) fever, (-) chills, (-) night sweat Nose and Sinuses: (-) nasal stuffiness or itchiness, (-) postnasal drip, (-) nosebleeds, (-) sinus trouble. Mouth and Throat: (-) sore throat, (-) hoarseness. Neck: (-) swollen glands, (-) enlarged thyroid, (-) neck pain. Respiratory: no cough, (-) shortness of breath, (-) wheezing. Neurologic: (-) numbness, (-) tingling. Psychiatric: (-) anxiety, (-) depression  Obstructive sleep apnea  Chronic kidney disease requiring chronic dialysis (Tristan Bender)  Restless legs syndrome  CPAP use counseling    Current Medication: Outpatient Encounter Medications as of 05/18/2020  Medication Sig  . calcium acetate (PHOSLO) 667 MG capsule Take 2,668 mg by mouth 3 (three) times  daily with meals.  . Cholecalciferol (VITAMIN D PO) Take 1 tablet by mouth daily.  . cinacalcet (SENSIPAR) 60 MG tablet Take by mouth.  . ferrous sulfate 325 (65 FE) MG tablet Take 1 tablet (325 mg total) by mouth 2 (two) times daily with a meal.  . Heparin, Porcine, in NaCl 1000-0.9 UT/500ML-% SOLN Heparin Sodium (Porcine) 1,000 Units/mL Systemic  . lidocaine-prilocaine (EMLA) cream SMARTSIG:Sparingly Topical  . pregabalin (LYRICA) 50 MG capsule Take by mouth.  Marland Kitchen rOPINIRole (REQUIP) 0.5 MG tablet Take by mouth.  . rosuvastatin (CRESTOR) 20 MG tablet Take 20 mg by mouth at bedtime.  . sevelamer carbonate (RENVELA) 800 MG tablet Take 1,600 mg by mouth 3 (three) times daily as needed.  . [DISCONTINUED] acetaminophen (TYLENOL) 325 MG tablet Take 650 mg by mouth every 4 (four) hours as needed for pain.  . [DISCONTINUED] allopurinol (ZYLOPRIM) 100 MG tablet Take 100 mg by mouth daily.  . [DISCONTINUED] ALPRAZolam (XANAX) 0.5 MG tablet Take 0.5 mg by mouth at bedtime as needed.  . [DISCONTINUED] alprostadil (MUSE) 1000 MCG pellet Place into the urethra.  . [DISCONTINUED] midodrine (PROAMATINE) 10 MG tablet Take by mouth.  . [DISCONTINUED] sildenafil (VIAGRA) 50 MG tablet Take 50 mg by mouth at bedtime.  . [DISCONTINUED] zolpidem (AMBIEN) 5 MG tablet Take 1 tablet (5 mg total) by mouth at bedtime as needed for sleep.   No facility-administered encounter medications on file as of 05/18/2020.    Surgical History: Past Surgical History:  Procedure Laterality Date  . ABDOMINAL SURGERY    .  COLON SURGERY    . DIALYSIS/PERMA CATHETER INSERTION N/A 06/30/2016   Procedure: Dialysis/Perma Catheter Insertion;  Surgeon: Algernon Huxley, MD;  Location: [place] [facility];  Service: Cardiovascular;  Laterality: N/A;  . ESOPHAGOGASTRODUODENOSCOPY (EGD) WITH PROPOFOL N/A 07/01/2016   Procedure: ESOPHAGOGASTRODUODENOSCOPY (EGD) WITH PROPOFOL;  Surgeon: Lucilla Lame, MD;  Location: [facility];  Service: Endoscopy;   Laterality: N/A;  . LAPAROSCOPIC GASTRIC SLEEVE RESECTION      Medical History: Past Medical History:  Diagnosis Date  . Adenomatous duodenal polyp    jan 2018[facility]  . Hypertension   . Renal disorder     Family History: Non contributory to the present illness  Social History: Social History   Socioeconomic History  . Marital status: Divorced    Spouse name: Not on file  . Number of children: Not on file  . Years of education: Not on file  . Highest education level: Not on file  Occupational History  . Not on file  Tobacco Use  . Smoking status: Never Smoker  . Smokeless tobacco: Never Used  Substance and Sexual Activity  . Alcohol use: No  . Drug use: No  . Sexual activity: Yes  Other Topics Concern  . Not on file  Social History Narrative  . Not on file   Social Determinants of Health   Financial Resource Strain: Not on file  Food Insecurity: Not on file  Transportation Needs: Not on file  Physical Activity: Not on file  Stress: Not on file  Social Connections: Not on file  Intimate Partner Violence: Not on file    Vital Signs: Blood pressure 120/63, pulse 86, height 5\' 7"  (1.702 m), weight 254 lb (115.2 kg), SpO2 100 %.  Examination: General Appearance: The patient is well-developed, well-nourished, and in no distress. Neck Circumference: 44 Skin: Gross inspection of skin unremarkable. Head: normocephalic, no gross deformities. Eyes: no gross deformities noted. ENT: ears appear grossly normal Neurologic: Alert and oriented. No involuntary movements.    EPWORTH SLEEPINESS SCALE:  Scale:  (0)= no chance of dozing; (1)= slight chance of dozing; (2)= moderate chance of dozing; (3)= high chance of dozing  Chance  Situtation    Sitting and reading: 2    Watching TV: 2    Sitting Inactive in public: 0    As a passenger in car: 0      Lying down to rest: 0    Sitting and talking: 0    Sitting quielty after lunch: 0    In a car, stopped  in traffic: 0   TOTAL SCORE:   4 out of 24    SLEEP STUDIES:  1. PSG 08/15/07 AHI 119 SpO25min 78%   CPAP COMPLIANCE DATA:  Date Range: 04/18/20 - 05/17/20  Average Daily Use: 2:23 hours  Median Use: 2:28  Compliance for > 4 Hours: 17%  AHI: 24.0 respiratory events per hour  Days Used: 28/30 days  Mask Leak: 47.4  95th Percentile Pressure: 21/16 cmH2O         LABS: No results found for this or any previous visit (from the past 2160 hour(s)).  Radiology: No results found.  No results found.  No results found.    Assessment and Plan: Patient Active Problem List   Diagnosis Date Noted  . Chronic kidney disease requiring chronic dialysis ([place]) 05/18/2020  . Restless legs syndrome 05/18/2020  . Anemia 07/21/2016  . Iron deficiency anemia due to chronic blood loss   . Other diseases of stomach  and duodenum   . Chronic duodenal ulcer with hemorrhage   . GI bleed 06/30/2016  . Symptomatic anemia 06/30/2016      The patient does tolerate PAP and reports significant benefit from PAP use. The humidifier is set at max.  The pressurew as increased to 20 of CPAP for snoring. He has lost 35 lbs following weight loss surgery but he still has obstructive events on his download.Some of this may be due to short use time. He was advised to take .75 mg ropinerole. In the morning and afternoon then take the remaining 1.5 mg 90 min before bed. Hewas reminded how to clean his CPAP and advised to stop using the cleaning machine and increase time in bed..  The compliance is very poor. The AHI is very elevated some of which may be plms.   1. OSA- use CPAP all night every night. Download in one month. 2. RLS- take ropinerole as directed above.  General Counseling: I have discussed the findings of the evaluation and examination with Belenda Cruise.  I have also discussed any further diagnostic evaluation thatmay be needed or ordered today. Icker verbalizes understanding of the  findings of todays visit. We also reviewed his medications today and discussed drug interactions and side effects including but not limited excessive drowsiness and altered mental states. We also discussed that there is always a risk not just to him but also people around him. he has been encouraged to call the office with any questions or concerns that should arise related to todays visit.  No orders of the defined types were placed in this encounter.       I have personally obtained a history, examined the patient, evaluated laboratory and imaging results, formulated the assessment and plan and placed orders.  Dahlia Byes, PA  Richelle Ito. Saunders Glance, PhD, FAASM  Diplomate, American Board of Sleep Medicine    Allyne Gee, MD [facility] Diplomate ABMS Pulmonary and Critical Care Medicine Sleep medicine

## 2020-06-16 NOTE — Progress Notes (Signed)
[facility] [place] [place], [place] [address]  Pulmonary Sleep Medicine   Office Visit Note  Patient Name: Tristan Bender DOB: Oct 18, 1960 MRN [id number]  I connected with  Deon Pilling on 06/17/20 by a video enabled telemedicine application and verified that I am speaking with the correct person using two identifiers.   I discussed the limitations of evaluation and management by telemedicine. The patient expressed understanding and agreed to proceed.   Chief Complaint: Obstructive Sleep Apnea visit  Brief History:  Tristan Bender is seen today for follow up The patient has a 11 year history of sleep apnea. Patient is not  using PAP nightly.  He has RLS and had been told that he thrashes at night. He stopped using the CPAP because his partner does not like it.  He has lost over 60 lbs and will need a medicare restart. Ropinerole  3mg  bid for his RLS but ROS  General: (-) fever, (-) chills, (-) night sweat Nose and Sinuses: (-) nasal stuffiness or itchiness, (-) postnasal drip, (-) nosebleeds, (-) sinus trouble. Mouth and Throat: (-) sore throat, (-) hoarseness. Neck: (-) swollen glands, (-) enlarged thyroid, (-) neck pain. Respiratory: - cough, - shortness of breath, - wheezing. Neurologic: - numbness, - tingling. Psychiatric: - anxiety, - depression   Current Medication: Outpatient Encounter Medications as of 06/17/2020  Medication Sig  . calcium acetate (PHOSLO) 667 MG capsule Take 2,668 mg by mouth 3 (three) times daily with meals.  . Cholecalciferol (VITAMIN D PO) Take 1 tablet by mouth daily.  . cinacalcet (SENSIPAR) 60 MG tablet Take by mouth.  . ferrous sulfate 325 (65 FE) MG tablet Take 1 tablet (325 mg total) by mouth 2 (two) times daily with a meal.  . Heparin, Porcine, in NaCl 1000-0.9 UT/500ML-% SOLN Heparin Sodium (Porcine) 1,000 Units/mL Systemic  . lidocaine-prilocaine (EMLA) cream SMARTSIG:Sparingly Topical  . pregabalin (LYRICA) 50 MG capsule Take  by mouth.  Marland Kitchen rOPINIRole (REQUIP) 0.5 MG tablet Take by mouth.  . rosuvastatin (CRESTOR) 20 MG tablet Take 20 mg by mouth at bedtime.  . sevelamer carbonate (RENVELA) 800 MG tablet Take 1,600 mg by mouth 3 (three) times daily as needed.   No facility-administered encounter medications on file as of 06/17/2020.    Surgical History: Past Surgical History:  Procedure Laterality Date  . ABDOMINAL SURGERY    . COLON SURGERY    . DIALYSIS/PERMA CATHETER INSERTION N/A 06/30/2016   Procedure: Dialysis/Perma Catheter Insertion;  Surgeon: Algernon Huxley, MD;  Location: [place] [facility];  Service: Cardiovascular;  Laterality: N/A;  . ESOPHAGOGASTRODUODENOSCOPY (EGD) WITH PROPOFOL N/A 07/01/2016   Procedure: ESOPHAGOGASTRODUODENOSCOPY (EGD) WITH PROPOFOL;  Surgeon: Lucilla Lame, MD;  Location: [facility];  Service: Endoscopy;  Laterality: N/A;  . LAPAROSCOPIC GASTRIC SLEEVE RESECTION      Medical History: Past Medical History:  Diagnosis Date  . Adenomatous duodenal polyp    jan 2018[facility]  . Hypertension   . Renal disorder     Family History: Non contributory to the present illness  Social History: Social History   Socioeconomic History  . Marital status: Divorced    Spouse name: Not on file  . Number of children: Not on file  . Years of education: Not on file  . Highest education level: Not on file  Occupational History  . Not on file  Tobacco Use  . Smoking status: Never Smoker  . Smokeless tobacco: Never Used  Substance and Sexual Activity  . Alcohol use: No  .  Drug use: No  . Sexual activity: Yes  Other Topics Concern  . Not on file  Social History Narrative  . Not on file   Social Determinants of Health   Financial Resource Strain: Not on file  Food Insecurity: Not on file  Transportation Needs: Not on file  Physical Activity: Not on file  Stress: Not on file  Social Connections: Not on file  Intimate Partner Violence: Not on file    Vital Signs: There were  no vitals taken for this visit. Pt reported wt 236lbs. Ht 5'7" Examination: General Appearance: The patient is well-developed, well-nourished, and in no distress. Neck Circumference:  Skin: Gross inspection of skin unremarkable. Head: normocephalic, no gross deformities. Eyes: no gross deformities noted. ENT: ears appear grossly normal Neurologic: Alert and oriented. No involuntary movements.    EPWORTH SLEEPINESS SCALE:  Scale:  (0)= no chance of dozing; (1)= slight chance of dozing; (2)= moderate chance of dozing; (3)= high chance of dozing  Chance  Situtation       SLEEP STUDIES:  1. Split 08/15/07   CPAP COMPLIANCE DATA:  Date Range: 05/18/20-06/14/20  Average Daily Use: 1 hours  Median Use: 1  Compliance for > 4 Hours: 64%   AHI: 23.9 respiratory events per hour  Days Used: 18/28  Mask Leak: 66.5  95th Percentile Pressure: 20         LABS: No results found for this or any previous visit (from the past 2160 hour(s)).  Radiology: No results found.  No results found.  No results found.    Assessment and Plan: Patient Active Problem List   Diagnosis Date Noted  . Chronic kidney disease requiring chronic dialysis ([place]) 05/18/2020  . Restless legs syndrome 05/18/2020  . Obstructive sleep apnea 10/29/2019  . BMI 45.0-49.9, adult ([place]) 03/11/2019  . Anemia 07/21/2016  . Iron deficiency anemia due to chronic blood loss   . Other diseases of stomach and duodenum   . Chronic duodenal ulcer with hemorrhage   . GI bleed 06/30/2016  . Symptomatic anemia 06/30/2016  . ESRD on hemodialysis ([place]) 05/16/2016  . Iron deficiency 04/02/2015  . HTN (hypertension) 03/20/2015      The patient does tolerate PAP and reports some benefit from PAP use. He will need a repeat PSG for a medicare restart.  His RLS also does not appear to be controlled. He is currently on ropinerole 1mg x3 bid  Which may be causing augmentation of his symptoms.  1. OSA- will  schedule PSG for restarte 2. RLS- will follow up after study. Per pt his nephrologist changed his dose of ropinerole to 1mg x3 BID. He also takes 50mg  Lyrica TID. Will need to reassess medication management after study. 3. ESRD - On dialysis and followed by nephrology. 4. Obesity Counseling: Had a lengthy discussion regarding patients BMI and weight issues. Patient was instructed on portion control as well as increased activity. Also discussed caloric restrictions with trying to maintain intake less than 2000 Kcal. Discussions were made in accordance with the 5As of weight management. Simple actions such as not eating late and if able to, taking a walk is suggested.  General Counseling: I have discussed the findings of the evaluation and examination with Belenda Cruise.  I have also discussed any further diagnostic evaluation thatmay be needed or ordered today. Eyden verbalizes understanding of the findings of todays visit. We also reviewed his medications today and discussed drug interactions and side effects including but not limited excessive drowsiness and altered mental states.  We also discussed that there is always a risk not just to him but also people around him. he has been encouraged to call the office with any questions or concerns that should arise related to todays visit.  No orders of the defined types were placed in this encounter.     I have personally obtained a history, examined the patient, evaluated laboratory and imaging results, formulated the assessment and plan and placed orders.  This patient was seen by Drema Dallas, PA-C in collaboration with Dr. Devona Konig as a part of collaborative care agreement.   Richelle Ito Saunders Glance, PhD, FAASM  Diplomate, American Board of Sleep Medicine    Allyne Gee, MD [facility] Diplomate ABMS Pulmonary and Critical Care Medicine Sleep medicine

## 2020-06-17 ENCOUNTER — Ambulatory Visit (INDEPENDENT_AMBULATORY_CARE_PROVIDER_SITE_OTHER): Payer: Medicare Other | Admitting: Internal Medicine

## 2020-06-17 DIAGNOSIS — G4733 Obstructive sleep apnea (adult) (pediatric): Secondary | ICD-10-CM | POA: Diagnosis not present

## 2020-06-17 DIAGNOSIS — G2581 Restless legs syndrome: Secondary | ICD-10-CM | POA: Diagnosis not present

## 2020-06-17 DIAGNOSIS — E669 Obesity, unspecified: Secondary | ICD-10-CM

## 2020-06-17 DIAGNOSIS — Z992 Dependence on renal dialysis: Secondary | ICD-10-CM

## 2020-06-17 DIAGNOSIS — N186 End stage renal disease: Secondary | ICD-10-CM

## 2020-07-13 ENCOUNTER — Ambulatory Visit (INDEPENDENT_AMBULATORY_CARE_PROVIDER_SITE_OTHER): Payer: Medicare Other | Admitting: Internal Medicine

## 2020-07-13 DIAGNOSIS — E669 Obesity, unspecified: Secondary | ICD-10-CM

## 2020-07-13 DIAGNOSIS — Z9989 Dependence on other enabling machines and devices: Secondary | ICD-10-CM | POA: Insufficient documentation

## 2020-07-13 DIAGNOSIS — N186 End stage renal disease: Secondary | ICD-10-CM

## 2020-07-13 DIAGNOSIS — G4733 Obstructive sleep apnea (adult) (pediatric): Secondary | ICD-10-CM | POA: Diagnosis not present

## 2020-07-13 DIAGNOSIS — G2581 Restless legs syndrome: Secondary | ICD-10-CM | POA: Diagnosis not present

## 2020-07-13 DIAGNOSIS — Z992 Dependence on renal dialysis: Secondary | ICD-10-CM

## 2020-07-13 DIAGNOSIS — Z7189 Other specified counseling: Secondary | ICD-10-CM | POA: Diagnosis not present

## 2020-07-13 NOTE — Patient Instructions (Signed)

## 2020-07-13 NOTE — Progress Notes (Signed)
[facility] [place], [place] [address]  Pulmonary Sleep Medicine   Office Visit Note  Patient Name: Tristan Bender DOB: Nov 02, 1960 MRN [id number]    Chief Complaint: Obstructive Sleep Apnea visit  Brief History:  Tristan Bender is seen today for follow up The patient has a 11 year history of sleep apnea. Patient is not  using PAP nightly.  He has never had an issue with CPAP until he got his new machine. The smell bothers him and he rips the mask off. The compliance download shows zero  Compliance.  He denies any RLS symptoms and reports sleeping well. He is taking ropinerole 3 mg bid and Lyrica 40 bid. ROS  General: (-) fever, (-) chills, (-) night sweat Nose and Sinuses: (-) nasal stuffiness or itchiness, (-) postnasal drip, (-) nosebleeds, (-) sinus trouble. Mouth and Throat: (-) sore throat, (-) hoarseness. Neck: (-) swollen glands, (-) enlarged thyroid, (-) neck pain. Respiratory: - cough, - shortness of breath, - wheezing. Neurologic: - numbness, - tingling. Psychiatric: - anxiety, - depression   Current Medication: Outpatient Encounter Medications as of 07/13/2020  Medication Sig  . apixaban (ELIQUIS) 2.5 MG TABS tablet Take by mouth.  . pantoprazole (PROTONIX) 40 MG tablet Take by mouth.  . calcium acetate (PHOSLO) 667 MG capsule Take 2,668 mg by mouth 3 (three) times daily with meals.  . Cholecalciferol (VITAMIN D PO) Take 1 tablet by mouth daily.  . cinacalcet (SENSIPAR) 60 MG tablet Take by mouth.  . clonazePAM (KLONOPIN) 1 MG tablet Take 1 mg by mouth at bedtime.  . ferrous sulfate 325 (65 FE) MG tablet Take 1 tablet (325 mg total) by mouth 2 (two) times daily with a meal.  . Heparin, Porcine, in NaCl 1000-0.9 UT/500ML-% SOLN Heparin Sodium (Porcine) 1,000 Units/mL Systemic  . lidocaine-prilocaine (EMLA) cream SMARTSIG:Sparingly Topical  . pregabalin (LYRICA) 50 MG capsule Take by mouth.  Marland Kitchen rOPINIRole (REQUIP) 0.5 MG tablet Take by mouth.  .  rosuvastatin (CRESTOR) 20 MG tablet Take 20 mg by mouth at bedtime.  . sevelamer carbonate (RENVELA) 800 MG tablet Take 1,600 mg by mouth 3 (three) times daily as needed.   No facility-administered encounter medications on file as of 07/13/2020.    Surgical History: Past Surgical History:  Procedure Laterality Date  . ABDOMINAL SURGERY    . COLON SURGERY    . DIALYSIS/PERMA CATHETER INSERTION N/A 06/30/2016   Procedure: Dialysis/Perma Catheter Insertion;  Surgeon: Algernon Huxley, MD;  Location: [facility];  Service: Cardiovascular;  Laterality: N/A;  . ESOPHAGOGASTRODUODENOSCOPY (EGD) WITH PROPOFOL N/A 07/01/2016   Procedure: ESOPHAGOGASTRODUODENOSCOPY (EGD) WITH PROPOFOL;  Surgeon: Lucilla Lame, MD;  Location: [facility];  Service: Endoscopy;  Laterality: N/A;  . LAPAROSCOPIC GASTRIC SLEEVE RESECTION      Medical History: Past Medical History:  Diagnosis Date  . Adenomatous duodenal polyp    jan 2018[facility]  . Hypertension   . Renal disorder     Family History: Non contributory to the present illness  Social History: Social History   Socioeconomic History  . Marital status: Divorced    Spouse name: Not on file  . Number of children: Not on file  . Years of education: Not on file  . Highest education level: Not on file  Occupational History  . Not on file  Tobacco Use  . Smoking status: Never Smoker  . Smokeless tobacco: Never Used  Substance and Sexual Activity  . Alcohol use: No  . Drug use: No  .  Sexual activity: Yes  Other Topics Concern  . Not on file  Social History Narrative  . Not on file   Social Determinants of Health   Financial Resource Strain: Not on file  Food Insecurity: Not on file  Transportation Needs: Not on file  Physical Activity: Not on file  Stress: Not on file  Social Connections: Not on file  Intimate Partner Violence: Not on file    Vital Signs: Blood pressure 123/71, pulse 91, temperature 99 F (37.2 C), temperature  source Temporal, resp. rate 16, height 5\' 7"  (1.702 m), weight 223 lb (101.2 kg), SpO2 (!) 81 %.  Examination: General Appearance: The patient is well-developed, well-nourished, and in no distress. Neck Circumference: 42 Skin: Gross inspection of skin unremarkable. Head: normocephalic, no gross deformities. Eyes: no gross deformities noted. ENT: ears appear grossly normal Neurologic: Alert and oriented. No involuntary movements.    EPWORTH SLEEPINESS SCALE:  Scale:  (0)= no chance of dozing; (1)= slight chance of dozing; (2)= moderate chance of dozing; (3)= high chance of dozing  Chance  Situtation    Sitting and reading: 0    Watching TV: 2    Sitting Inactive in public: 0    As a passenger in car: 3      Lying down to rest: 1    Sitting and talking: 0    Sitting quielty after lunch: 0    In a car, stopped in traffic: 1   TOTAL SCORE:   7 out of 24    SLEEP STUDIES:  1. PSG 02/14/20 - AHI 103,  low SpO2 65%   CPAP COMPLIANCE DATA:  Date Range: 06/12/20-07/11/20  Average Daily Use: 10 min hours  Median Use: 31 min  Compliance for > 4 Hours: zero%  AHI: 23.6 respiratory events per hour  Days Used: 9/30  Mask Leak: 84.5 lpm  95th Percentile Pressure: 20 cmh2o         LABS: No results found for this or any previous visit (from the past 2160 hour(s)).  Radiology: No results found.  No results found.  No results found.    Assessment and Plan: Patient Active Problem List   Diagnosis Date Noted  . CPAP (continuous positive airway pressure) dependence 07/13/2020  . Chronic kidney disease requiring chronic dialysis ([place]) 05/18/2020  . Restless legs syndrome 05/18/2020  . Obstructive sleep apnea 10/29/2019  . BMI 45.0-49.9, adult ([place]) 03/11/2019  . Anemia 07/21/2016  . Iron deficiency anemia due to chronic blood loss   . Other diseases of stomach and duodenum   . Chronic duodenal ulcer with hemorrhage   . GI bleed 06/30/2016  .  Symptomatic anemia 06/30/2016  . Class 3 obesity with body mass index (BMI) of 40.0 to 44.9 in adult 06/10/2016  . ESRD on hemodialysis ([place]) 05/16/2016  . Anemia in chronic kidney disease 09/30/2015  . Iron deficiency 04/02/2015  . HTN (hypertension) 03/20/2015      The patient does not tolerate PAP due to some reported smelland reports not enough use to benefit from PAP use. The compliance is zero. The pressure was dropped to the recommended setting of 12 cm H2O. He will restart CPAP at this new pressure setting. Treatment options were discussed. He does not have a ferritin level in his records and this will be ordered.  1. Obstructive sleep apnea Use CPAP nightly  2. CPAP use counseling CPAP couseling-Discussed importance of adequate CPAP use as well as proper care and cleaning techniques of machine and all  supplies.  3. Restless legs syndrome Continue ropinerole and Lyrica as recommended. Also now taking klonopin at night from PCP.  4. ESRD on hemodialysis ([place]) Followed by nephrology.  5. Obesity (BMI 30.0-34.9) Obesity Counseling: Had a lengthy discussion regarding patients BMI and weight issues. Patient was instructed on portion control as well as increased activity. Also discussed caloric restrictions with trying to maintain intake less than 2000 Kcal. Discussions were made in accordance with the 5As of weight management. Simple actions such as not eating late and if able to, taking a walk is suggested.    General Counseling: I have discussed the findings of the evaluation and examination with Belenda Cruise.  I have also discussed any further diagnostic evaluation thatmay be needed or ordered today. Rocky verbalizes understanding of the findings of todays visit. We also reviewed his medications today and discussed drug interactions and side effects including but not limited excessive drowsiness and altered mental states. We also discussed that there is always a risk not just to him  but also people around him. he has been encouraged to call the office with any questions or concerns that should arise related to todays visit.  No orders of the defined types were placed in this encounter.       I have personally obtained a history, examined the patient, evaluated laboratory and imaging results, formulated the assessment and plan and placed orders.  This patient was seen by Drema Dallas, PA-C in collaboration with Dr. Devona Konig as a part of collaborative care agreement.   Richelle Ito Saunders Glance, PhD, FAASM  Diplomate, American Board of Sleep Medicine    Allyne Gee, MD [facility] Diplomate ABMS Pulmonary and Critical Care Medicine Sleep medicine

## 2020-08-11 ENCOUNTER — Other Ambulatory Visit: Payer: Self-pay | Admitting: Physician Assistant

## 2020-08-11 ENCOUNTER — Other Ambulatory Visit (HOSPITAL_COMMUNITY): Payer: Self-pay | Admitting: Physician Assistant

## 2020-08-11 DIAGNOSIS — Z9884 Bariatric surgery status: Secondary | ICD-10-CM

## 2020-08-11 DIAGNOSIS — R634 Abnormal weight loss: Secondary | ICD-10-CM

## 2020-08-11 DIAGNOSIS — R1013 Epigastric pain: Secondary | ICD-10-CM

## 2020-08-12 ENCOUNTER — Other Ambulatory Visit: Payer: Self-pay

## 2020-08-12 ENCOUNTER — Emergency Department: Payer: Medicare Other

## 2020-08-12 ENCOUNTER — Emergency Department
Admission: EM | Admit: 2020-08-12 | Discharge: 2020-08-12 | Disposition: A | Discharge status: Home / Self Care | Payer: Medicare Other | Attending: Emergency Medicine | Admitting: Emergency Medicine

## 2020-08-12 ENCOUNTER — Telehealth: Payer: Self-pay

## 2020-08-12 ENCOUNTER — Encounter: Payer: Self-pay | Admitting: *Deleted

## 2020-08-12 DIAGNOSIS — Z7901 Long term (current) use of anticoagulants: Secondary | ICD-10-CM | POA: Diagnosis not present

## 2020-08-12 DIAGNOSIS — Z79899 Other long term (current) drug therapy: Secondary | ICD-10-CM | POA: Insufficient documentation

## 2020-08-12 DIAGNOSIS — R1010 Upper abdominal pain, unspecified: Secondary | ICD-10-CM | POA: Diagnosis present

## 2020-08-12 DIAGNOSIS — Z992 Dependence on renal dialysis: Secondary | ICD-10-CM | POA: Insufficient documentation

## 2020-08-12 DIAGNOSIS — Z20822 Contact with and (suspected) exposure to covid-19: Secondary | ICD-10-CM | POA: Diagnosis not present

## 2020-08-12 DIAGNOSIS — N186 End stage renal disease: Secondary | ICD-10-CM | POA: Insufficient documentation

## 2020-08-12 DIAGNOSIS — K8020 Calculus of gallbladder without cholecystitis without obstruction: Secondary | ICD-10-CM | POA: Diagnosis not present

## 2020-08-12 DIAGNOSIS — I120 Hypertensive chronic kidney disease with stage 5 chronic kidney disease or end stage renal disease: Secondary | ICD-10-CM | POA: Insufficient documentation

## 2020-08-12 DIAGNOSIS — Z9884 Bariatric surgery status: Secondary | ICD-10-CM | POA: Diagnosis not present

## 2020-08-12 LAB — COMPREHENSIVE METABOLIC PANEL
ALT: 7 U/L (ref 0–44)
AST: 10 U/L — ABNORMAL LOW (ref 15–41)
Albumin: 3.7 g/dL (ref 3.5–5.0)
Alkaline Phosphatase: 52 U/L (ref 38–126)
Anion gap: 13 (ref 5–15)
BUN: 72 mg/dL — ABNORMAL HIGH (ref 6–20)
CO2: 27 mmol/L (ref 22–32)
Calcium: 8.9 mg/dL (ref 8.9–10.3)
Chloride: 97 mmol/L — ABNORMAL LOW (ref 98–111)
Creatinine, Ser: 17.28 mg/dL — ABNORMAL HIGH (ref 0.61–1.24)
GFR, Estimated: 3 mL/min — ABNORMAL LOW (ref >60)
Glucose, Bld: 100 mg/dL — ABNORMAL HIGH (ref 70–99)
Potassium: 5.1 mmol/L (ref 3.5–5.1)
Sodium: 137 mmol/L (ref 135–145)
Total Bilirubin: 0.9 mg/dL (ref 0.3–1.2)
Total Protein: 7.2 g/dL (ref 6.5–8.1)

## 2020-08-12 LAB — CBC WITH DIFFERENTIAL/PLATELET
Abs Immature Granulocytes: 0.02 10*3/uL (ref 0.00–0.07)
Basophils Absolute: 0 10*3/uL (ref 0.0–0.1)
Basophils Relative: 0 %
Eosinophils Absolute: 0.2 10*3/uL (ref 0.0–0.5)
Eosinophils Relative: 3 %
HCT: 38.5 % — ABNORMAL LOW (ref 39.0–52.0)
Hemoglobin: 12.3 g/dL — ABNORMAL LOW (ref 13.0–17.0)
Immature Granulocytes: 0 %
Lymphocytes Relative: 21 %
Lymphs Abs: 1.5 10*3/uL (ref 0.7–4.0)
MCH: 27.9 pg (ref 26.0–34.0)
MCHC: 31.9 g/dL (ref 30.0–36.0)
MCV: 87.3 fL (ref 80.0–100.0)
Monocytes Absolute: 0.7 10*3/uL (ref 0.1–1.0)
Monocytes Relative: 9 %
Neutro Abs: 4.9 10*3/uL (ref 1.7–7.7)
Neutrophils Relative %: 67 %
Platelets: 239 10*3/uL (ref 150–400)
RBC: 4.41 MIL/uL (ref 4.22–5.81)
RDW: 14.5 % (ref 11.5–15.5)
WBC: 7.4 10*3/uL (ref 4.0–10.5)
nRBC: 0 % (ref 0.0–0.2)

## 2020-08-12 LAB — RESP PANEL BY RT-PCR (FLU A&B, COVID) ARPGX2
Influenza A by PCR: NEGATIVE
Influenza B by PCR: NEGATIVE
SARS Coronavirus 2 by RT PCR: NEGATIVE

## 2020-08-12 LAB — LIPASE, BLOOD: Lipase: 38 U/L (ref 11–51)

## 2020-08-12 LAB — LACTIC ACID, PLASMA: Lactic Acid, Venous: 0.8 mmol/L (ref 0.5–1.9)

## 2020-08-12 LAB — TROPONIN I (HIGH SENSITIVITY)
Troponin I (High Sensitivity): 29 ng/L — ABNORMAL HIGH (ref <18)
Troponin I (High Sensitivity): 25 ng/L — ABNORMAL HIGH (ref <18)

## 2020-08-12 MED ORDER — SODIUM CHLORIDE 0.9 % IV BOLUS (SEPSIS)
500.0000 mL | Freq: Once | INTRAVENOUS | Status: AC
  Administered 2020-08-12: 500 mL via INTRAVENOUS

## 2020-08-12 MED ORDER — SODIUM CHLORIDE 0.9 % IV BOLUS (SEPSIS): 500.0000 mL | Freq: Once | INTRAVENOUS | Status: DC

## 2020-08-12 MED ORDER — ONDANSETRON 4 MG PO TBDP: 4.0000 mg | ORAL_TABLET | Freq: Four times a day (QID) | ORAL | 0 refills | Status: DC | PRN

## 2020-08-12 MED ORDER — OXYCODONE-ACETAMINOPHEN 5-325 MG PO TABS: 1.0000 | ORAL_TABLET | ORAL | 0 refills | Status: DC | PRN

## 2020-08-12 NOTE — ED Provider Notes (Addendum)
[facility] Emergency Department Provider Note  ____________________________________________   Event Date/Time   First MD Initiated Contact with Patient 08/12/20 [phone]     (approximate)  I have reviewed the triage vital signs and the nursing notes.   HISTORY  Chief Complaint Abdominal Pain    HPI Tristan Bender is a 60 y.o. male with history of end-stage renal disease on hemodialysis, obesity status post gastric sleeve at [facility] approximately 04/28/2020 who presents to the emergency department with a "stretching and twisting" and "uneasy" feeling in his upper abdomen.  States pain has been present since Thursday, 6 days ago.  He has had some nausea but no vomiting.  No diarrhea, bloody stools or melena.  No fever, chest pain or shortness of breath.  States that he had a virtual visit with his bariatric surgeon, Dr. Valaria Good, yesterday who thought this may be his gallbladder.  States he took an oxycodone prior to arrival and his pain is now 1/10.  He receives dialysis on Tuesday, Thursday and Saturday.  Last dialysis was Saturday.  Missed dialysis yesterday due to not feeling well.  He denies any radiation of pain.  No aggravating or alleviating factors.  Patient was found to be hypotensive in triage.  He denies any dizziness.  It does appear he takes midodrine prior to dialysis.  He previously had a history of hypertension but is not on any blood pressure medications at this time.  Patient reports he has been on dialysis for 4 years.  He states he no longer makes urine.        Past Medical History:  Diagnosis Date  . Adenomatous duodenal polyp    jan 2018[facility]  . Hypertension   . Renal disorder     Patient Active Problem List   Diagnosis Date Noted  . CPAP (continuous positive airway pressure) dependence 07/13/2020  . Chronic kidney disease requiring chronic dialysis ([place]) 05/18/2020  . Restless legs syndrome 05/18/2020  . Obstructive sleep  apnea 10/29/2019  . BMI 45.0-49.9, adult ([place]) 03/11/2019  . Anemia 07/21/2016  . Iron deficiency anemia due to chronic blood loss   . Other diseases of stomach and duodenum   . Chronic duodenal ulcer with hemorrhage   . GI bleed 06/30/2016  . Symptomatic anemia 06/30/2016  . Class 3 obesity with body mass index (BMI) of 40.0 to 44.9 in adult 06/10/2016  . ESRD on hemodialysis ([place]) 05/16/2016  . Anemia in chronic kidney disease 09/30/2015  . Iron deficiency 04/02/2015  . HTN (hypertension) 03/20/2015    Past Surgical History:  Procedure Laterality Date  . ABDOMINAL SURGERY    . COLON SURGERY    . DIALYSIS/PERMA CATHETER INSERTION N/A 06/30/2016   Procedure: Dialysis/Perma Catheter Insertion;  Surgeon: Algernon Huxley, MD;  Location: [place] [facility];  Service: Cardiovascular;  Laterality: N/A;  . ESOPHAGOGASTRODUODENOSCOPY (EGD) WITH PROPOFOL N/A 07/01/2016   Procedure: ESOPHAGOGASTRODUODENOSCOPY (EGD) WITH PROPOFOL;  Surgeon: Lucilla Lame, MD;  Location: [facility] ENDOSCOPY;  Service: Endoscopy;  Laterality: N/A;  . LAPAROSCOPIC GASTRIC SLEEVE RESECTION      Prior to Admission medications   Medication Sig Start Date End Date Taking? Authorizing Provider  ondansetron (ZOFRAN ODT) 4 MG disintegrating tablet Take 1 tablet (4 mg total) by mouth every 6 (six) hours as needed for nausea or vomiting. 08/12/20  Yes Isidore Margraf, Delice Bison, DO  oxyCODONE-acetaminophen (PERCOCET/ROXICET) 5-325 MG tablet Take 1 tablet by mouth every 4 (four) hours as needed. 08/12/20  Yes Gianna Calef,  Folashade Gamboa N, DO  apixaban (ELIQUIS) 2.5 MG TABS tablet Take by mouth. 04/23/20   [provider]  calcium acetate (PHOSLO) 667 MG capsule Take 2,668 mg by mouth 3 (three) times daily with meals.    [provider]  Cholecalciferol (VITAMIN D PO) Take 1 tablet by mouth daily.    [provider]  cinacalcet (SENSIPAR) 60 MG tablet Take by mouth.    [provider]  clonazePAM (KLONOPIN) 1 MG tablet Take 1  mg by mouth at bedtime. 07/01/20   [provider]  ferrous sulfate 325 (65 FE) MG tablet Take 1 tablet (325 mg total) by mouth 2 (two) times daily with a meal. 07/03/16   Vaughan Basta, MD  Heparin, Porcine, in NaCl 1000-0.9 UT/500ML-% SOLN Heparin Sodium (Porcine) 1,000 Units/mL Systemic 02/12/20   [provider]  lidocaine-prilocaine (EMLA) cream SMARTSIG:Sparingly Topical 03/04/20   [provider]  pantoprazole (PROTONIX) 40 MG tablet Take by mouth. 04/07/20   [provider]  pregabalin (LYRICA) 50 MG capsule Take by mouth. 02/09/19   [provider]  rOPINIRole (REQUIP) 0.5 MG tablet Take by mouth. 02/04/20   [provider]  rosuvastatin (CRESTOR) 20 MG tablet Take 20 mg by mouth at bedtime. 12/18/19   [provider]  sevelamer carbonate (RENVELA) 800 MG tablet Take 1,600 mg by mouth 3 (three) times daily as needed. 06/16/16   [provider]    Allergies Patient has no known allergies.  Family History  Problem Relation Age of Onset  . Renal Disease Maternal Uncle     Social History Social History   Tobacco Use  . Smoking status: Never Smoker  . Smokeless tobacco: Never Used  Substance Use Topics  . Alcohol use: No  . Drug use: No    Review of Systems Constitutional: No fever. Eyes: No visual changes. ENT: No sore throat. Cardiovascular: Denies chest pain. Respiratory: Denies shortness of breath. Gastrointestinal: No vomiting, diarrhea. Genitourinary: Negative for dysuria. Musculoskeletal: Negative for back pain. Skin: Negative for rash. Neurological: Negative for focal weakness or numbness.  ____________________________________________   PHYSICAL EXAM:  VITAL SIGNS: ED Triage Vitals  Enc Vitals Group     BP 08/12/20 0239 (!) 84/67     Pulse Rate 08/12/20 0239 100     Resp 08/12/20 0239 18     Temp 08/12/20 0239 98 F (36.7 C)     Temp Source 08/12/20 0239 Oral     SpO2 08/12/20  0239 95 %     Weight 08/12/20 0238 10 lb (4.536 kg)     Height 08/12/20 0238 5\' 7"  (1.702 m)     Head Circumference --      Peak Flow --      Pain Score 08/12/20 0238 1     Pain Loc --      Pain Edu? --      Excl. in [place]? --    CONSTITUTIONAL: Alert and oriented and responds appropriately to questions. Well-appearing; well-nourished, nontoxic-appearing, afebrile, smiling HEAD: Normocephalic EYES: Conjunctivae clear, pupils appear equal, EOM appear intact ENT: normal nose; moist mucous membranes NECK: Supple, normal ROM CARD: RRR; S1 and S2 appreciated; no murmurs, no clicks, no rubs, no gallops RESP: Normal chest excursion without splinting or tachypnea; breath sounds clear and equal bilaterally; no wheezes, no rhonchi, no rales, no hypoxia or respiratory distress, speaking full sentences ABD/GI: Normal bowel sounds; non-distended; soft, minimal tenderness throughout the upper abdomen, negative Murphy sign, no tenderness at McBurney's point, no  rebound, no guarding, no peritoneal signs, no hepatosplenomegaly BACK: The back appears normal EXT: Normal ROM in all joints; no deformity noted, no edema; no cyanosis; fistula in the left forearm with normal thrill, 2+ left radial pulse, no surrounding redness or warmth, no tenderness or soft tissue swelling SKIN: Normal color for age and race; warm; no rash on exposed skin NEURO: Moves all extremities equally PSYCH: The patient's mood and manner are appropriate.  ____________________________________________   LABS (all labs ordered are listed, but only abnormal results are displayed)  Labs Reviewed  CBC WITH DIFFERENTIAL/PLATELET - Abnormal; Notable for the following components:      Result Value   Hemoglobin 12.3 (*)    HCT 38.5 (*)    All other components within normal limits  COMPREHENSIVE METABOLIC PANEL - Abnormal; Notable for the following components:   Chloride 97 (*)    Glucose, Bld 100 (*)    BUN 72 (*)    Creatinine, Ser 17.28  (*)    AST 10 (*)    GFR, Estimated 3 (*)    All other components within normal limits  TROPONIN I (HIGH SENSITIVITY) - Abnormal; Notable for the following components:   Troponin I (High Sensitivity) 29 (*)    All other components within normal limits  TROPONIN I (HIGH SENSITIVITY) - Abnormal; Notable for the following components:   Troponin I (High Sensitivity) 25 (*)    All other components within normal limits  RESP PANEL BY RT-PCR (FLU A&B, COVID) ARPGX2  LIPASE, BLOOD  LACTIC ACID, PLASMA   ____________________________________________  EKG   EKG Interpretation  Date/Time:  Wednesday August 12 2020 03:00:35 EDT Ventricular Rate:  65 PR Interval:  184 QRS Duration: 133 QT Interval:  411 QTC Calculation: 428 R Axis:   -62 Text Interpretation: Sinus rhythm Right bundle branch block Confirmed by Pryor Curia [phone]) on 08/12/2020 3:04:02 AM       ____________________________________________  RADIOLOGY Jessie Foot Tima Curet, personally viewed and evaluated these images (plain radiographs) as part of my medical decision making, as well as reviewing the written report by the radiologist.  ED MD interpretation: Cholelithiasis seen on ultrasound.  Clear chest x-ray.  Official radiology report(s): DG Chest Portable 1 View  Result Date: 08/12/2020 CLINICAL DATA:  Hypoxia, hypotension EXAM: PORTABLE CHEST 1 VIEW COMPARISON:  None. FINDINGS: Lungs volumes are small, but are symmetric and are clear. No pneumothorax or pleural effusion. Cardiac size within normal limits. Pulmonary vascularity is normal. Osseous structures are age-appropriate. No acute bone abnormality. IMPRESSION: No active disease. Electronically Signed   By: Fidela Salisbury MD   On: 08/12/2020 03:28   US Abdomen Limited RUQ (LIVER/GB)  Result Date: 08/12/2020 CLINICAL DATA:  Upper abdominal pain EXAM: ULTRASOUND ABDOMEN LIMITED RIGHT UPPER QUADRANT COMPARISON:  None. FINDINGS: Gallbladder: A mobile 13 mm gallstone is  seen within the gallbladder lumen. The gallbladder, however, is not distended, there is no gallbladder wall thickening, and no pericholecystic fluid is identified. The sonographic Percell Miller sign is reportedly negative. Common bile duct: Diameter: 3 mm in proximal diameter. Liver: Hepatic parenchymal echogenicity and echotexture is normal. There is, however, subtle nodularity of the a hepatic contour suggesting changes of underlying cirrhosis. No focal intrahepatic masses are seen and there is no intrahepatic biliary ductal dilation. Portal vein is patent on color Doppler imaging with normal direction of blood flow towards the liver. Other: The right kidney demonstrates marked cortical thinning with multiple simple cortical cysts seen in keeping with the given history of end-stage  renal disease. IMPRESSION: Cholelithiasis without sonographic evidence of acute cholecystitis. Hepatic parenchymal changes in keeping with cirrhosis. No focal hepatic mass identified. Parenchymal changes within the right kidney in keeping with chronic renal failure. Electronically Signed   By: Fidela Salisbury MD   On: 08/12/2020 04:57    ____________________________________________   PROCEDURES  Procedure(s) performed (including Critical Care):  Procedures  ___________________________________________   INITIAL IMPRESSION / ASSESSMENT AND PLAN / ED COURSE  As part of my medical decision making, I reviewed the following data within the [place] notes reviewed and incorporated, Labs reviewed , EKG interpreted , Old EKG reviewed, Old chart reviewed, Radiograph reviewed , Notes from prior ED visits and [place] Controlled Substance Database         Patient here with complaints of upper abdominal pain.  His bariatric surgeon was concerned about gallbladder disease.  Will obtain LFTs, lipase and right upper quadrant abdominal ultrasound.  Symptoms seem atypical for ACS but will check EKG, troponin given he  is hypotensive.  It does appear he takes midodrine on days prior to dialysis.  He does not appear significantly volume depleted here.  Denies any vomiting or diarrhea.  He denies any bloody stool or melena.  He does have a history of anemia secondary to chronic kidney disease.  Will monitor blood pressure closely and give gentle IV fluids.  Discussed with patient if ultrasound was unremarkable, we will proceed with CT of abdomen pelvis given his recent gastric sleeve surgery in December.  Differential includes cholelithiasis, cholecystitis, cholangitis, pancreatitis, gastritis, bowel obstruction, intra-abdominal abscess.  ED PROGRESS  Patient's work-up has been reassuring.  He has no leukocytosis.  Hemoglobin 12.3.  Lactate normal.  Normal potassium.  LFTs, lipase normal.  Troponin is 29 but this is in the setting of chronic kidney disease.  His EKG is nonischemic.  He did have a brief episode of hypoxia when falling asleep and told nursing staff that he does wear CPAP at night.  He was placed on 2 L nasal cannula with good relief.  His chest x-ray is clear and shows no infiltrate or edema.  His Covid and flu swabs are negative.  He denies any chest pain or shortness of breath.  His right upper quadrant ultrasound shows cholelithiasis without cholecystitis or choledocholithiasis.  Suspect that this is the cause of his pain today.  His blood pressures have improved with gentle IV hydration.   Repeat troponin has gone down to 25.  I feel he is safe to be discharged home.  Discussed findings of cholelithiasis with patient recommended outpatient general surgery follow-up.  He declines any pain medicine here and still very well-appearing.  Recommended low-fat diet which she is already adhering to given his recent gastric sleeve.  I do not feel he needs emergent surgery at this time.  No signs of cholecystitis, choledocholithiasis.  Will refill his oxycodone as he states he is out of this medication and prescribe  Zofran as well.  Discussed return precautions.  He is comfortable with this plan.    At this time, I do not feel there is any life-threatening condition present. I have reviewed, interpreted and discussed all results (EKG, imaging, lab, urine as appropriate) and exam findings with patient/family. I have reviewed nursing notes and appropriate previous records.  I feel the patient is safe to be discharged home without further emergent workup and can continue workup as an outpatient as needed. Discussed usual and customary return precautions. Patient/family verbalize understanding and  are comfortable with this plan.  Outpatient follow-up has been provided as needed. All questions have been answered.  6:00 AM  Pt does have a brief drops in his blood pressure when he is lying flat, resting but he is able to get up and walk around the department without any dizziness and on recheck of his blood pressure it is 102/72.  He states he is feeling well.  Will discharge home.  ____________________________________________   FINAL CLINICAL IMPRESSION(S) / ED DIAGNOSES  Final diagnoses:  Upper abdominal pain  Calculus of gallbladder without cholecystitis without obstruction     ED Discharge Orders         Ordered    oxyCODONE-acetaminophen (PERCOCET/ROXICET) 5-325 MG tablet  Every 4 hours PRN        08/12/20 0522    ondansetron (ZOFRAN ODT) 4 MG disintegrating tablet  Every 6 hours PRN        08/12/20 0522          *Please note:  Jermanie Minshall was evaluated in Emergency Department on 08/12/2020 for the symptoms described in the history of present illness. He was evaluated in the context of the global COVID-19 pandemic, which necessitated consideration that the patient might be at risk for infection with the SARS-CoV-2 virus that causes COVID-19. Institutional protocols and algorithms that pertain to the evaluation of patients at risk for COVID-19 are in a state of rapid change based on information  released by regulatory bodies including the CDC and federal and state organizations. These policies and algorithms were followed during the patient's care in the ED.  Some ED evaluations and interventions may be delayed as a result of limited staffing during and the pandemic.*   Note:  This document was prepared using Dragon voice recognition software and may include unintentional dictation errors.   Beckett Hickmon, Delice Bison, DO 08/12/20 [place], Delice Bison, DO 08/12/20 [id number]

## 2020-08-12 NOTE — Telephone Encounter (Signed)
LVM for pt to call the office to schedule an appointment with Dr. Hampton Abbot.

## 2020-08-12 NOTE — ED Triage Notes (Addendum)
Pt ambulatory to triage.  Pt has upper abd pain for 5 days. Pt taking pain meds without relief.   Pt reports vomiting today.  No diarrhea.  No back pain.  Dialysis pt, missed treatment today.  Last dialysis was 4 days ago. Pt alert  Pt denies chest pain or sob.  Pt hypotensive in triage.  Skin warm and dry.  No dizziness.  Pt taken to room 10.  Dr ward aware.

## 2020-08-12 NOTE — ED Notes (Signed)
Pt ambulated 2 laps around the department briskly and with ease.

## 2020-08-17 ENCOUNTER — Telehealth: Payer: Self-pay

## 2020-08-17 NOTE — Telephone Encounter (Signed)
LVM for pt to call the office to get him scheduled to see Dr. Hampton Abbot.

## 2020-08-20 DIAGNOSIS — K8050 Calculus of bile duct without cholangitis or cholecystitis without obstruction: Secondary | ICD-10-CM | POA: Insufficient documentation

## 2020-08-20 HISTORY — DX: Calculus of bile duct without cholangitis or cholecystitis without obstruction: K80.50

## 2020-08-26 ENCOUNTER — Other Ambulatory Visit: Payer: Self-pay

## 2020-08-26 ENCOUNTER — Observation Stay
Admission: EM | Admit: 2020-08-26 | Discharge: 2020-08-27 | Disposition: A | Discharge status: Home / Self Care | Payer: Medicare Other | Attending: Internal Medicine | Admitting: Internal Medicine

## 2020-08-26 ENCOUNTER — Emergency Department: Payer: Medicare Other

## 2020-08-26 DIAGNOSIS — Z79899 Other long term (current) drug therapy: Secondary | ICD-10-CM | POA: Diagnosis not present

## 2020-08-26 DIAGNOSIS — R1011 Right upper quadrant pain: Secondary | ICD-10-CM

## 2020-08-26 DIAGNOSIS — G4733 Obstructive sleep apnea (adult) (pediatric): Secondary | ICD-10-CM

## 2020-08-26 DIAGNOSIS — N186 End stage renal disease: Secondary | ICD-10-CM | POA: Diagnosis not present

## 2020-08-26 DIAGNOSIS — R109 Unspecified abdominal pain: Secondary | ICD-10-CM | POA: Diagnosis not present

## 2020-08-26 DIAGNOSIS — I120 Hypertensive chronic kidney disease with stage 5 chronic kidney disease or end stage renal disease: Secondary | ICD-10-CM | POA: Insufficient documentation

## 2020-08-26 DIAGNOSIS — R112 Nausea with vomiting, unspecified: Secondary | ICD-10-CM

## 2020-08-26 DIAGNOSIS — R110 Nausea: Secondary | ICD-10-CM

## 2020-08-26 DIAGNOSIS — Z7901 Long term (current) use of anticoagulants: Secondary | ICD-10-CM | POA: Insufficient documentation

## 2020-08-26 DIAGNOSIS — Z992 Dependence on renal dialysis: Secondary | ICD-10-CM | POA: Diagnosis not present

## 2020-08-26 DIAGNOSIS — Z20822 Contact with and (suspected) exposure to covid-19: Secondary | ICD-10-CM | POA: Diagnosis not present

## 2020-08-26 HISTORY — DX: Right upper quadrant pain: R10.11

## 2020-08-26 LAB — CBC WITH DIFFERENTIAL/PLATELET
Abs Immature Granulocytes: 0.03 10*3/uL (ref 0.00–0.07)
Basophils Absolute: 0 10*3/uL (ref 0.0–0.1)
Basophils Relative: 0 %
Eosinophils Absolute: 0.2 10*3/uL (ref 0.0–0.5)
Eosinophils Relative: 4 %
HCT: 33.9 % — ABNORMAL LOW (ref 39.0–52.0)
Hemoglobin: 11 g/dL — ABNORMAL LOW (ref 13.0–17.0)
Immature Granulocytes: 1 %
Lymphocytes Relative: 13 %
Lymphs Abs: 0.8 10*3/uL (ref 0.7–4.0)
MCH: 28.1 pg (ref 26.0–34.0)
MCHC: 32.4 g/dL (ref 30.0–36.0)
MCV: 86.5 fL (ref 80.0–100.0)
Monocytes Absolute: 0.8 10*3/uL (ref 0.1–1.0)
Monocytes Relative: 12 %
Neutro Abs: 4.5 10*3/uL (ref 1.7–7.7)
Neutrophils Relative %: 70 %
Platelets: 230 10*3/uL (ref 150–400)
RBC: 3.92 MIL/uL — ABNORMAL LOW (ref 4.22–5.81)
RDW: 14.5 % (ref 11.5–15.5)
WBC: 6.3 10*3/uL (ref 4.0–10.5)
nRBC: 0 % (ref 0.0–0.2)

## 2020-08-26 LAB — COMPREHENSIVE METABOLIC PANEL
ALT: <5 U/L (ref 0–44)
AST: 10 U/L — ABNORMAL LOW (ref 15–41)
Albumin: 3.6 g/dL (ref 3.5–5.0)
Alkaline Phosphatase: 45 U/L (ref 38–126)
Anion gap: 15 (ref 5–15)
BUN: 83 mg/dL — ABNORMAL HIGH (ref 6–20)
CO2: 26 mmol/L (ref 22–32)
Calcium: 8.7 mg/dL — ABNORMAL LOW (ref 8.9–10.3)
Chloride: 99 mmol/L (ref 98–111)
Creatinine, Ser: 20.8 mg/dL — ABNORMAL HIGH (ref 0.61–1.24)
GFR, Estimated: 2 mL/min — ABNORMAL LOW (ref >60)
Glucose, Bld: 83 mg/dL (ref 70–99)
Potassium: 5.7 mmol/L — ABNORMAL HIGH (ref 3.5–5.1)
Sodium: 140 mmol/L (ref 135–145)
Total Bilirubin: 0.5 mg/dL (ref 0.3–1.2)
Total Protein: 6.9 g/dL (ref 6.5–8.1)

## 2020-08-26 LAB — RESP PANEL BY RT-PCR (FLU A&B, COVID) ARPGX2
Influenza A by PCR: NEGATIVE
Influenza B by PCR: NEGATIVE
SARS Coronavirus 2 by RT PCR: NEGATIVE

## 2020-08-26 LAB — LIPASE, BLOOD: Lipase: 35 U/L (ref 11–51)

## 2020-08-26 MED ORDER — ACETAMINOPHEN 650 MG RE SUPP: 650.0000 mg | Freq: Four times a day (QID) | RECTAL | Status: DC | PRN

## 2020-08-26 MED ORDER — PREGABALIN 25 MG PO CAPS
50.0000 mg | ORAL_CAPSULE | Freq: Every day | ORAL | Status: DC
  Administered 2020-08-27: 50 mg via ORAL
  Filled 2020-08-26: qty 2

## 2020-08-26 MED ORDER — ROPINIROLE HCL 1 MG PO TABS
0.5000 mg | ORAL_TABLET | Freq: Two times a day (BID) | ORAL | Status: DC
  Administered 2020-08-27: 0.5 mg via ORAL
  Filled 2020-08-26: qty 1

## 2020-08-26 MED ORDER — PROCHLORPERAZINE EDISYLATE 10 MG/2ML IJ SOLN
10.0000 mg | Freq: Once | INTRAMUSCULAR | Status: AC
  Administered 2020-08-26: 10 mg via INTRAVENOUS
  Filled 2020-08-26: qty 2

## 2020-08-26 MED ORDER — PATIROMER SORBITEX CALCIUM 8.4 G PO PACK
8.4000 g | PACK | Freq: Once | ORAL | Status: AC
  Administered 2020-08-26: 8.4 g via ORAL
  Filled 2020-08-26: qty 1

## 2020-08-26 MED ORDER — ACETAMINOPHEN 325 MG PO TABS: 650.0000 mg | ORAL_TABLET | Freq: Four times a day (QID) | ORAL | Status: DC | PRN

## 2020-08-26 MED ORDER — ROSUVASTATIN CALCIUM 20 MG PO TABS
20.0000 mg | ORAL_TABLET | Freq: Every day | ORAL | Status: DC
  Filled 2020-08-26: qty 1

## 2020-08-26 MED ORDER — ONDANSETRON HCL 4 MG PO TABS: 4.0000 mg | ORAL_TABLET | Freq: Four times a day (QID) | ORAL | Status: DC | PRN

## 2020-08-26 MED ORDER — ONDANSETRON HCL 4 MG/2ML IJ SOLN
4.0000 mg | Freq: Four times a day (QID) | INTRAMUSCULAR | Status: DC | PRN
  Administered 2020-08-27 (×3): 4 mg via INTRAVENOUS
  Filled 2020-08-26 (×3): qty 2

## 2020-08-26 MED ORDER — ROPINIROLE HCL 1 MG PO TABS
3.0000 mg | ORAL_TABLET | Freq: Once | ORAL | Status: AC
  Administered 2020-08-26: 3 mg via ORAL
  Filled 2020-08-26: qty 3

## 2020-08-26 NOTE — Consult Note (Signed)
Patient ID: Tristan Bender, male   DOB: 1960/08/31, 60 y.o.   MRN: [id number]  HPI [name] is a 60 y.o. male seen in consultation at the request of Dr. Posey Pronto.  He does have significant history of end-stage renal disease on hemodialysis and had a gastric sleeve done on December 2021 by Dr. Gustavus Messing.  And 5 days ago had a laparoscopic cholecystectomy as well by Dr. Gustavus Messing.  He states that ever since he had surgery had abdominal pain nausea and decreased appetite.  He denies any fevers or any chills.  No evidence of biliary obstruction.  No jaundice.  He reports that the gallbladder surgery was much worse compared to the gastric sleeve.  Did have work-up to include a CT scan of the abdomen pelvis that I personally reviewed showing evidence of a fluid collection around the gallbladder fossa and some air.  There was no contrast.  Differential includes postsurgical changes versus biloma versus abscess. CBC was normal except some anemia and CMP shows a creatinine of 20 potassium 5.7,.  Normal LFTs to include a normal bilirubin.  Describes the pain is intermittent moderate in intensity and located in the epigastric area and right upper quadrant.  There is no specific alleviating or aggravating factors.  Is not a personal review outside records to include extensive unit of operative reports at [facility]. He has lost about 80 pounds ever since he had a gastric sleeve 4 months ago Chiropodist on call contacted by the emergency room MD  and apparently he was not concerned and declined transfer at this time  HPI  Past Medical History:  Diagnosis Date  . Adenomatous duodenal polyp    jan 2018[facility]  . Hypertension   . Renal disorder     Past Surgical History:  Procedure Laterality Date  . ABDOMINAL SURGERY    . CHOLECYSTECTOMY    . COLON SURGERY    . DIALYSIS/PERMA CATHETER INSERTION N/A 06/30/2016   Procedure: Dialysis/Perma Catheter Insertion;  Surgeon: Algernon Huxley, MD;  Location: [place]  [facility];  Service: Cardiovascular;  Laterality: N/A;  . ESOPHAGOGASTRODUODENOSCOPY (EGD) WITH PROPOFOL N/A 07/01/2016   Procedure: ESOPHAGOGASTRODUODENOSCOPY (EGD) WITH PROPOFOL;  Surgeon: Lucilla Lame, MD;  Location: [facility] ENDOSCOPY;  Service: Endoscopy;  Laterality: N/A;  . LAPAROSCOPIC GASTRIC SLEEVE RESECTION      Family History  Problem Relation Age of Onset  . Renal Disease Maternal Uncle     Social History Social History   Tobacco Use  . Smoking status: Never Smoker  . Smokeless tobacco: Never Used  Substance Use Topics  . Alcohol use: No  . Drug use: No    No Known Allergies  No current facility-administered medications for this encounter.   Current Outpatient Medications  Medication Sig Dispense Refill  . Cholecalciferol (VITAMIN D PO) Take 1 tablet by mouth daily.    . cinacalcet (SENSIPAR) 60 MG tablet Take 60 mg by mouth daily.    . ferrous sulfate 325 (65 FE) MG tablet Take 1 tablet (325 mg total) by mouth 2 (two) times daily with a meal. 60 tablet 3  . lidocaine-prilocaine (EMLA) cream SMARTSIG:Sparingly Topical    . ondansetron (ZOFRAN ODT) 4 MG disintegrating tablet Take 1 tablet (4 mg total) by mouth every 6 (six) hours as needed for nausea or vomiting. 20 tablet 0  . pregabalin (LYRICA) 50 MG capsule Take 50 mg by mouth daily.    Marland Kitchen rOPINIRole (REQUIP) 0.5 MG tablet Take 0.5 mg by mouth 2 (two)  times daily.    . rosuvastatin (CRESTOR) 20 MG tablet Take 20 mg by mouth at bedtime.    Marland Kitchen apixaban (ELIQUIS) 2.5 MG TABS tablet Take by mouth. (Patient not taking: No sig reported)    . calcium acetate (PHOSLO) 667 MG capsule Take 2,668 mg by mouth 3 (three) times daily with meals. (Patient not taking: Reported on 08/26/2020)    . clonazePAM (KLONOPIN) 1 MG tablet Take 1 mg by mouth at bedtime. (Patient not taking: No sig reported)    . Heparin, Porcine, in NaCl 1000-0.9 UT/500ML-% SOLN Heparin Sodium (Porcine) 1,000 Units/mL Systemic (Patient not taking: No sig reported)     . oxyCODONE-acetaminophen (PERCOCET/ROXICET) 5-325 MG tablet Take 1 tablet by mouth every 4 (four) hours as needed. (Patient not taking: No sig reported) 15 tablet 0  . pantoprazole (PROTONIX) 40 MG tablet Take by mouth. (Patient not taking: No sig reported)    . sevelamer carbonate (RENVELA) 800 MG tablet Take 1,600 mg by mouth 3 (three) times daily as needed. (Patient not taking: Reported on 08/26/2020)  2     Review of Systems Full ROS  was asked and was negative except for the information on the HPI  Physical Exam Blood pressure 98/68, pulse 90, temperature 98.5 F (36.9 C), temperature source Oral, resp. rate 18, height 5\' 7"  (1.702 m), weight 95.3 kg, SpO2 94 %. CONSTITUTIONAL: NAD, obese EYES: Pupils are equal, round, andSclera are non-icteric. EARS, NOSE, MOUTH AND THROAT: Wearing a mask Hearing is intact to voice. LYMPH NODES:  Lymph nodes in the neck are normal. RESPIRATORY:  Lungs are clear. There is normal respiratory effort, with equal breath sounds bilaterally, and without pathologic use of accessory muscles. CARDIOVASCULAR: Heart is regular without murmurs, gallops, or rubs. GI: The abdomen is soft, mild appropriate incisional tenderness, no peritonitis, incisions c/d/i w/o infection. There are no palpable masses. There is no hepatosplenomegaly. There are normal bowel sounds . Reducible LIH GU: Rectal deferred.   MUSCULOSKELETAL: Normal muscle strength and tone. No cyanosis or edema.   SKIN: Turgor is good and there are no pathologic skin lesions or ulcers. NEUROLOGIC: Motor and sensation is grossly normal. Cranial nerves are grossly intact. PSYCH:  Oriented to person, place and time. Affect is normal.  Data Reviewed  I have personally reviewed the patient's imaging, laboratory findings and medical records.    Assessment/Plan  60 year old male with abdominal pain and nausea.  CT scan findings show some concern for potential biloma and we need to rule this out.  We will  order a HIDA scan stat.  There is no evidence of sepsis there is no evidence of peritonitis that would mandate immediate surgical intervention.  Recommend admission to hospitalist and nephrology consultation for further care regarding medical therapy.  We will continue to follow.  Extensive counseling provided to the patient.  As discussed in detail with the ER attending. We will follow  Caroleen Hamman, MD FACS General Surgeon 08/26/2020, 10:52 PM

## 2020-08-26 NOTE — ED Triage Notes (Signed)
Pt to ED c/o abd pain since Saturday, pt states last Friday he had his gallbladder taken out and since Saturday has had abd pain and nausea. Pt was given prescription for phenergan but is not out of medicine. Pt denies v/d.

## 2020-08-26 NOTE — ED Provider Notes (Signed)
[facility] Emergency Department Provider Note   ____________________________________________   Event Date/Time   First MD Initiated Contact with Patient 08/26/20 1912     (approximate)  I have reviewed the triage vital signs and the nursing notes.   HISTORY  Chief Complaint Abdominal Pain    HPI Tristan Bender is a 60 y.o. male with past medical history of hypertension and ESRD on HD (TTS) who presents to the ED complaining of abdominal pain.  Patient underwent cholecystectomy 5 days ago at [facility] in [place], states he had significant issues with nausea and vomiting following his procedure.  He felt slightly better at the time of discharge, but since he has been home has felt persistently nauseous with multiple episodes of vomiting.  He denies any associated diarrhea, but he has started to develop worsening pain in the right upper quadrant of his abdomen.  He describes the pain as sharp and exacerbated when he goes to eat.  He denies any fevers, cough, chest pain, or shortness of breath.  He states he felt too bad to go to his dialysis appointment yesterday, was last dialyzed 4 days ago.        Past Medical History:  Diagnosis Date  . Adenomatous duodenal polyp    jan 2018[facility]  . Hypertension   . Renal disorder     Patient Active Problem List   Diagnosis Date Noted  . RUQ abdominal pain 08/26/2020  . CPAP (continuous positive airway pressure) dependence 07/13/2020  . Chronic kidney disease requiring chronic dialysis ([place]) 05/18/2020  . Restless legs syndrome 05/18/2020  . OSA on CPAP 10/29/2019  . BMI 45.0-49.9, adult ([place]) 03/11/2019  . Anemia 07/21/2016  . Iron deficiency anemia due to chronic blood loss   . Other diseases of stomach and duodenum   . Chronic duodenal ulcer with hemorrhage   . GI bleed 06/30/2016  . Symptomatic anemia 06/30/2016  . Class 3 obesity with body mass index (BMI) of 40.0 to 44.9 in adult 06/10/2016  . ESRD on  hemodialysis ([place]) 05/16/2016  . Anemia in chronic kidney disease 09/30/2015  . Iron deficiency 04/02/2015  . HTN (hypertension) 03/20/2015    Past Surgical History:  Procedure Laterality Date  . ABDOMINAL SURGERY    . CHOLECYSTECTOMY    . COLON SURGERY    . DIALYSIS/PERMA CATHETER INSERTION N/A 06/30/2016   Procedure: Dialysis/Perma Catheter Insertion;  Surgeon: Algernon Huxley, MD;  Location: [place] [facility];  Service: Cardiovascular;  Laterality: N/A;  . ESOPHAGOGASTRODUODENOSCOPY (EGD) WITH PROPOFOL N/A 07/01/2016   Procedure: ESOPHAGOGASTRODUODENOSCOPY (EGD) WITH PROPOFOL;  Surgeon: Lucilla Lame, MD;  Location: [facility] ENDOS[facility];  Service: Endoscopy;  Laterality: N/A;  . LAPAROSCOPIC GASTRIC SLEEVE RESECTION      Prior to Admission medications   Medication Sig Start Date End Date Taking? Authorizing Provider  Cholecalciferol (VITAMIN D PO) Take 1 tablet by mouth daily.   Yes [provider]  cinacalcet (SENSIPAR) 60 MG tablet Take 60 mg by mouth daily.   Yes [provider]  ferrous sulfate 325 (65 FE) MG tablet Take 1 tablet (325 mg total) by mouth 2 (two) times daily with a meal. 07/03/16  Yes Vaughan Basta, MD  lidocaine-prilocaine (EMLA) cream SMARTSIG:Sparingly Topical 03/04/20  Yes [provider]  ondansetron (ZOFRAN ODT) 4 MG disintegrating tablet Take 1 tablet (4 mg total) by mouth every 6 (six) hours as needed for nausea or vomiting. 08/12/20  Yes Ward, Delice Bison, DO  pregabalin (LYRICA) 50  MG capsule Take 50 mg by mouth daily. 02/09/19  Yes [provider]  rOPINIRole (REQUIP) 0.5 MG tablet Take 0.5 mg by mouth 2 (two) times daily. 02/04/20  Yes [provider]  rosuvastatin (CRESTOR) 20 MG tablet Take 20 mg by mouth at bedtime. 12/18/19  Yes [provider]  calcium acetate (PHOSLO) 667 MG capsule Take 2,668 mg by mouth 3 (three) times daily with meals. Patient not taking: Reported on 08/26/2020    [provider]  sevelamer carbonate (RENVELA) 800 MG tablet Take 1,600 mg by mouth 3 (three) times daily as needed. Patient not taking: Reported on 08/26/2020 06/16/16   [provider]    Allergies Patient has no known allergies.  Family History  Problem Relation Age of Onset  . Renal Disease Maternal Uncle     Social History Social History   Tobacco Use  . Smoking status: Never Smoker  . Smokeless tobacco: Never Used  Substance Use Topics  . Alcohol use: No  . Drug use: No    Review of Systems  Constitutional: No fever/chills Eyes: No visual changes. ENT: No sore throat. Cardiovascular: Denies chest pain. Respiratory: Denies shortness of breath. Gastrointestinal: Positive for abdominal pain, nausea, and vomiting.  No diarrhea.  No constipation. Genitourinary: Negative for dysuria. Musculoskeletal: Negative for back pain. Skin: Negative for rash. Neurological: Negative for headaches, focal weakness or numbness.  ____________________________________________   PHYSICAL EXAM:  VITAL SIGNS: ED Triage Vitals  Enc Vitals Group     BP 08/26/20 1915 115/72     Pulse Rate 08/26/20 1916 92     Resp 08/26/20 1915 18     Temp 08/26/20 1915 98.5 F (36.9 C)     Temp Source 08/26/20 1915 Oral     SpO2 08/26/20 1916 97 %     Weight 08/26/20 1915 210 lb (95.3 kg)     Height 08/26/20 1915 5\' 7"  (1.702 m)     Head Circumference --      Peak Flow --      Pain Score 08/26/20 1915 0     Pain Loc --      Pain Edu? --      Excl. in [place]? --     Constitutional: Alert and oriented. Eyes: Conjunctivae are normal. Head: Atraumatic. Nose: No congestion/rhinnorhea. Mouth/Throat: Mucous membranes are moist. Neck: Normal ROM Cardiovascular: Normal rate, regular rhythm. Grossly normal heart sounds.  Left forearm AV fistula with palpable thrill. Respiratory: Normal respiratory effort.  No retractions. Lungs CTAB. Gastrointestinal: Soft and tender to palpation in the right upper quadrant  with no rebound or guarding. No distention. Genitourinary: deferred Musculoskeletal: No lower extremity tenderness nor edema. Neurologic:  Normal speech and language. No gross focal neurologic deficits are appreciated. Skin:  Skin is warm, dry and intact. No rash noted. Psychiatric: Mood and affect are normal. Speech and behavior are normal.  ____________________________________________   LABS (all labs ordered are listed, but only abnormal results are displayed)  Labs Reviewed  CBC WITH DIFFERENTIAL/PLATELET - Abnormal; Notable for the following components:      Result Value   RBC 3.92 (*)    Hemoglobin 11.0 (*)    HCT 33.9 (*)    All other components within normal limits  COMPREHENSIVE METABOLIC PANEL - Abnormal; Notable for the following components:   Potassium 5.7 (*)    BUN 83 (*)    Creatinine, Ser 20.80 (*)    Calcium 8.7 (*)    AST 10 (*)  GFR, Estimated 2 (*)    All other components within normal limits  RESP PANEL BY RT-PCR (FLU A&B, COVID) ARPGX2  LIPASE, BLOOD  HIV ANTIBODY (ROUTINE TESTING W REFLEX)  COMPREHENSIVE METABOLIC PANEL  CBC     PROCEDURES  Procedure(s) performed (including Critical Care):  Procedures   ____________________________________________   INITIAL IMPRESSION / ASSESSMENT AND PLAN / ED COURSE       60 year old male with past medical history of hypertension and ESRD on HD who presents to the ED with worsening pain in the right upper quadrant of his abdomen associated with nausea and vomiting following his cholecystectomy 5 days ago.  Labs are remarkable for mild hypokalemia, likely due to patient missing his recent dialysis.  We will treat with Veltassa.  Pain was further assessed with CT scan, which shows nonspecific fluid collection in his right upper quadrant.  Abscess seems unlikely given he is afebrile with no leukocytosis.  Findings were discussed with Dr. Kristen Cardinal of general surgery, who agrees that patient will need further  work-up for potential bile leak.  Potential transfer was discussed with [facility] in [place], who states that no acute surgical intervention is needed and therefore transfer is declined.  Case discussed with hospitalist for admission for further work-up for potential bile leak as well as intractable nausea and vomiting.  General surgery will follow and recommend starting with HIDA scan.      ____________________________________________   FINAL CLINICAL IMPRESSION(S) / ED DIAGNOSES  Final diagnoses:  RUQ abdominal pain  Non-intractable vomiting with nausea, unspecified vomiting type  ESRD (end stage renal disease) [facility])     ED Discharge Orders    None       Note:  This document was prepared using Dragon voice recognition software and may include unintentional dictation errors.   Blake Divine, MD 08/27/20 0000

## 2020-08-26 NOTE — H&P (Signed)
History and Physical    Tristan Bender JGG:[id number] DOB: 1961/01/03 DOA: 08/26/2020  PCP: Jodi Marble, MD  Patient coming from: Home  I have personally briefly reviewed patient's old medical records in [place]  Chief Complaint: Abdominal pain, nausea, vomiting  HPI: Tristan Bender is a 60 y.o. male with medical history significant for ESRD on TTS HD, HLD, RLS, s/p sleeve gastrectomy, OSA on CPAP, who underwent recent laparoscopic cholecystectomy and presents with nausea, vomiting, abdominal pain.  Patient recently admitted at [facility] in [place] 08/20/2020-08/22/2020 for cholelithiasis with biliary colic.  He underwent laparoscopic cholecystectomy on 4/8 and discharged the following day.  Per review of records in care everywhere, he was tolerating oral intake at time of discharge.  Patient states that since his cholecystectomy he has been having an uneasy feeling in his stomach.  He has been having intermittent pain in his right upper quadrant of the abdomen.  He has felt nauseous and has been afraid to eat due to vomiting when he tries solid foods.  He says he has been tolerating sips of water and has been able to take his medications regularly.  He says he had his first bowel movement yesterday after about 10 days which appeared normal.  He has not had any chest pain, dyspnea, subjective fevers, chills, diaphoresis.  He says he no longer makes urine.    He says his last dialysis session was on 4/9.  He says he felt too unwell to attend his usual dialysis this past Tuesday 4/12.  ED Course:  Initial vitals showed BP 115/72, pulse 79, RR 18, temp 98.5 F, SPO2 90% on room air.  Labs show WBC 6.3, hemoglobin 11.0, platelets 230,000, sodium 140, potassium 5.7, bicarb 26, BUN 83, creatinine 20.80, GFR 2, serum glucose 83, AST 10, ALT <5, alk phos 45, total bilirubin 0.5.  Lipase 35.  CT abdomen/pelvis without contrast showed postsurgical changes from cholecystectomy with 5.7 x  4.2 cm gas and fluid collection within the gallbladder fossa.  This could reflect postoperative hematoma or seroma, abscess, or biloma.  EDP discussed with [facility] for transfer however they were unable to accept patient.  EDP discussed with on-call general surgery, Dr. Dahlia Byes, who agreed to consult on patient here and recommended obtaining HIDA scan and medical admission.  The patient was given oral Veltassa and IV Compazine.  The hospitalist service was consulted to admit for further evaluation and management.  Review of Systems: All systems reviewed and are negative except as documented in history of present illness above.   Past Medical History:  Diagnosis Date  . Adenomatous duodenal polyp    jan 2018[facility]  . Hypertension   . Renal disorder     Past Surgical History:  Procedure Laterality Date  . ABDOMINAL SURGERY    . CHOLECYSTECTOMY    . COLON SURGERY    . DIALYSIS/PERMA CATHETER INSERTION N/A 06/30/2016   Procedure: Dialysis/Perma Catheter Insertion;  Surgeon: Algernon Huxley, MD;  Location: [place] [facility];  Service: Cardiovascular;  Laterality: N/A;  . ESOPHAGOGASTRODUODENOSCOPY (EGD) WITH PROPOFOL N/A 07/01/2016   Procedure: ESOPHAGOGASTRODUODENOSCOPY (EGD) WITH PROPOFOL;  Surgeon: Lucilla Lame, MD;  Location: [facility] END[facility]COPY;  Service: Endoscopy;  Laterality: N/A;  . LAPAROSCOPIC GASTRIC SLEEVE RESECTION      Social History:  reports that he has never smoked. He has never used smokeless tobacco. He reports that he does not drink alcohol and does not use drugs.  No Known Allergies  Family  History  Problem Relation Age of Onset  . Renal Disease Maternal Uncle      Prior to Admission medications   Medication Sig Start Date End Date Taking? Authorizing Provider  apixaban (ELIQUIS) 2.5 MG TABS tablet Take by mouth. 04/23/20   [provider]  calcium acetate (PHOSLO) 667 MG capsule Take 2,668 mg by mouth 3 (three) times daily with meals.    [provider]  Cholecalciferol (VITAMIN D PO) Take 1 tablet by mouth daily.    [provider]  cinacalcet (SENSIPAR) 60 MG tablet Take by mouth.    [provider]  clonazePAM (KLONOPIN) 1 MG tablet Take 1 mg by mouth at bedtime. 07/01/20   [provider]  ferrous sulfate 325 (65 FE) MG tablet Take 1 tablet (325 mg total) by mouth 2 (two) times daily with a meal. 07/03/16   Vaughan Basta, MD  Heparin, Porcine, in NaCl 1000-0.9 UT/500ML-% SOLN Heparin Sodium (Porcine) 1,000 Units/mL Systemic 02/12/20   [provider]  lidocaine-prilocaine (EMLA) cream SMARTSIG:Sparingly Topical 03/04/20   [provider]  ondansetron (ZOFRAN ODT) 4 MG disintegrating tablet Take 1 tablet (4 mg total) by mouth every 6 (six) hours as needed for nausea or vomiting. 08/12/20   Ward, Delice Bison, DO  oxyCODONE-acetaminophen (PERCOCET/ROXICET) 5-325 MG tablet Take 1 tablet by mouth every 4 (four) hours as needed. 08/12/20   Ward, Delice Bison, DO  pantoprazole (PROTONIX) 40 MG tablet Take by mouth. 04/07/20   [provider]  pregabalin (LYRICA) 50 MG capsule Take by mouth. 02/09/19   [provider]  rOPINIRole (REQUIP) 0.5 MG tablet Take by mouth. 02/04/20   [provider]  rosuvastatin (CRESTOR) 20 MG tablet Take 20 mg by mouth at bedtime. 12/18/19   [provider]  sevelamer carbonate (RENVELA) 800 MG tablet Take 1,600 mg by mouth 3 (three) times daily as needed. 06/16/16   [provider]    Physical Exam: Vitals:   08/26/20 1915 08/26/20 1916 08/26/20 1930 08/26/20 2000  BP: 115/72  110/77 98/68  Pulse:  92 79 90  Resp: 18     Temp: 98.5 F (36.9 C)     TempSrc: Oral     SpO2:  97% 90% 94%  Weight: 95.3 kg     Height: '5\' 7"'  (1.702 m)      Constitutional: NAD, calm, comfortable Eyes: PERRL, lids and conjunctivae normal ENMT: Mucous membranes are moist. Posterior pharynx clear of any exudate or lesions.Normal  dentition.  Neck: normal, supple, no masses. Respiratory: clear to auscultation bilaterally, no wheezing, no crackles. Normal respiratory effort. No accessory muscle use.  Cardiovascular: Regular rate and rhythm, no murmurs / rubs / gallops. No extremity edema. 2+ pedal pulses.  LUE aVF with palpable thrill. Abdomen: RUQ tenderness with palpation, no masses palpated. No hepatosplenomegaly. Bowel sounds diminished.  Musculoskeletal: no clubbing / cyanosis. No joint deformity upper and lower extremities. Good ROM, no contractures. Normal muscle tone.  Skin: no rashes, lesions, ulcers. No induration Neurologic: CN 2-12 grossly intact. Sensation intact. Strength 5/5 in all 4.  Psychiatric: Normal judgment and insight. Alert and oriented x 3. Normal mood.   Labs on Admission: I have personally reviewed following labs and imaging studies  CBC: Recent Labs  Lab 08/26/20 2024  WBC 6.3  NEUTROABS 4.5  HGB 11.0*  HCT 33.9*  MCV 86.5  PLT 277   Basic Metabolic Panel: Recent Labs  Lab 08/26/20 2024  NA 140  K 5.7*  CL  99  CO2 26  GLUCOSE 83  BUN 83*  CREATININE 20.80*  CALCIUM 8.7*   GFR: Estimated Creatinine Clearance: 4.2 mL/min (A) (by C-G formula based on SCr of 20.8 mg/dL (H)). Liver Function Tests: Recent Labs  Lab 08/26/20 2024  AST 10*  ALT <5  ALKPHOS 45  BILITOT 0.5  PROT 6.9  ALBUMIN 3.6   Recent Labs  Lab 08/26/20 2024  LIPASE 35   No results for input(s): AMMONIA in the last 168 hours. Coagulation Profile: No results for input(s): INR, PROTIME in the last 168 hours. Cardiac Enzymes: No results for input(s): CKTOTAL, CKMB, CKMBINDEX, TROPONINI in the last 168 hours. BNP (last 3 results) No results for input(s): PROBNP in the last 8760 hours. HbA1C: No results for input(s): HGBA1C in the last 72 hours. CBG: No results for input(s): GLUCAP in the last 168 hours. Lipid Profile: No results for input(s): CHOL, HDL, LDLCALC, TRIG, CHOLHDL, LDLDIRECT in the  last 72 hours. Thyroid Function Tests: No results for input(s): TSH, T4TOTAL, FREET4, T3FREE, THYROIDAB in the last 72 hours. Anemia Panel: No results for input(s): VITAMINB12, FOLATE, FERRITIN, TIBC, IRON, RETICCTPCT in the last 72 hours. Urine analysis: No results found for: COLORURINE, APPEARANCEUR, [place], [place], GLUCOSEU, HGBUR, BILIRUBINUR, KETONESUR, PROTEINUR, UROBILINOGEN, NITRITE, LEUKOCYTESUR  Radiological Exams on Admission: CT Abdomen Pelvis Wo Contrast  Result Date: 08/26/2020 CLINICAL DATA:  Abdominal pain for 5 days, recent cholecystectomy EXAM: CT ABDOMEN AND PELVIS WITHOUT CONTRAST TECHNIQUE: Multidetector CT imaging of the abdomen and pelvis was performed following the standard protocol without IV contrast. COMPARISON:  08/12/2020 FINDINGS: Lower chest: Trace bilateral pleural effusions. No acute airspace disease. Moderate hiatal hernia. Hepatobiliary: Postsurgical changes are seen from cholecystectomy, with 5.7 x 4.2 cm gas and fluid collection within the gallbladder fossa. Characterization is limited without IV contrast. Unenhanced imaging of the liver is unremarkable without biliary dilation. Pancreas: Unremarkable. No pancreatic ductal dilatation or surrounding inflammatory changes. Spleen: Normal in size without focal abnormality. Adrenals/Urinary Tract: Numerous there is bilateral renal atrophy, with cortical cysts. No urinary tract calculi or obstruction. Bladder is decompressed, limiting its evaluation. The adrenals are unremarkable. Stomach/Bowel: No bowel obstruction or ileus. No bowel wall thickening or inflammatory change. Postsurgical changes from previous bariatric surgery. Vascular/Lymphatic: Aortic atherosclerosis. No enlarged abdominal or pelvic lymph nodes. Reproductive: Prostate is unremarkable. Other: No free fluid or free gas. Fat containing left inguinal hernia. No bowel herniation. Musculoskeletal: No acute or destructive bony lesions. Reconstructed images  demonstrate no additional findings. IMPRESSION: 1. Postsurgical changes from cholecystectomy, with 5.7 x 4.2 cm gas and fluid collection within the gallbladder fossa. Evaluation is limited without IV contrast, this could reflect postoperative hematoma or seroma, abscess, or biloma. 2. Trace bilateral pleural effusions. 3. Bilateral renal cortical atrophy and numerous renal cortical cysts, consistent with end-stage renal disease. 4. Fat containing left inguinal hernia. 5.  Aortic Atherosclerosis (ICD10-I70.0). Electronically Signed   By: Randa Ngo M.D.   On: 08/26/2020 20:43    EKG: Personally reviewed. Sinus rhythm, RBBB and LAFB, motion artifact.  Not significantly changed when compared to prior.  Assessment/Plan Principal Problem:   RUQ abdominal pain Active Problems:   ESRD on hemodialysis (HCC)   OSA on CPAP   Tristan Bender is a 60 y.o. male with medical history significant for ESRD on TTS HD, HLD, RLS, s/p sleeve gastrectomy, OSA on CPAP, who underwent recent laparoscopic cholecystectomy and is admitted with suspected postoperative bile leak.  S/p laparoscopic cholecystectomy 08/21/2020 with postoperative RUQ abdominal pain, nausea,  vomiting: CT A/P shows gas and fluid collection within the gallbladder fossa suspicious for postoperative biloma.  Lower suspicion for abscess as he is afebrile and without leukocytosis. -General surgery consulted, appreciate assistance -HIDA scan ordered  ESRD on TTS HD: Last HD 4/9.  A.m. consult to nephrology for routine HD requested via epic order and secure chat.  Hyperkalemia: Received Veltassa in the ED.  Repeat labs in AM.  Hyperlipidemia: Continue rosuvastatin.  OSA: Continue CPAP nightly.  DVT prophylaxis: SCDs only for now Code Status: Full code, confirmed with patient Family Communication: Discussed with patient, he has discussed with family Disposition Plan: From home and likely discharge to home pending further general surgery  evaluation recommendations Consults called: General surgery Level of care: Med-Surg Admission status:  Status is: Observation  The patient remains OBS appropriate and will d/c before 2 midnights.  Dispo: The patient is from: Home              Anticipated d/c is to: Home              Patient currently is not medically stable to d/c.   Difficult to place patient No  Zada Finders MD [facility]  If 7PM-7AM, please contact night-coverage [email or web address]  08/26/2020, 10:55 PM

## 2020-08-27 ENCOUNTER — Observation Stay
Admit: 2020-08-27 | Discharge: 2020-08-27 | Disposition: A | Discharge status: Home / Self Care | Payer: Medicare Other | Attending: Surgery | Admitting: Surgery

## 2020-08-27 DIAGNOSIS — R932 Abnormal findings on diagnostic imaging of liver and biliary tract: Secondary | ICD-10-CM | POA: Diagnosis not present

## 2020-08-27 DIAGNOSIS — N186 End stage renal disease: Secondary | ICD-10-CM | POA: Diagnosis not present

## 2020-08-27 DIAGNOSIS — R1011 Right upper quadrant pain: Secondary | ICD-10-CM | POA: Diagnosis not present

## 2020-08-27 DIAGNOSIS — Z992 Dependence on renal dialysis: Secondary | ICD-10-CM | POA: Diagnosis not present

## 2020-08-27 DIAGNOSIS — Z9889 Other specified postprocedural states: Secondary | ICD-10-CM

## 2020-08-27 LAB — CBC
HCT: 33.3 % — ABNORMAL LOW (ref 39.0–52.0)
Hemoglobin: 11.1 g/dL — ABNORMAL LOW (ref 13.0–17.0)
MCH: 28.4 pg (ref 26.0–34.0)
MCHC: 33.3 g/dL (ref 30.0–36.0)
MCV: 85.2 fL (ref 80.0–100.0)
Platelets: 216 10*3/uL (ref 150–400)
RBC: 3.91 MIL/uL — ABNORMAL LOW (ref 4.22–5.81)
RDW: 14.4 % (ref 11.5–15.5)
WBC: 6.7 10*3/uL (ref 4.0–10.5)
nRBC: 0 % (ref 0.0–0.2)

## 2020-08-27 LAB — APTT: aPTT: 35 seconds (ref 24–36)

## 2020-08-27 LAB — COMPREHENSIVE METABOLIC PANEL
ALT: <5 U/L (ref 0–44)
AST: 8 U/L — ABNORMAL LOW (ref 15–41)
Albumin: 3.4 g/dL — ABNORMAL LOW (ref 3.5–5.0)
Alkaline Phosphatase: 44 U/L (ref 38–126)
Anion gap: 16 — ABNORMAL HIGH (ref 5–15)
BUN: 84 mg/dL — ABNORMAL HIGH (ref 6–20)
CO2: 24 mmol/L (ref 22–32)
Calcium: 8.7 mg/dL — ABNORMAL LOW (ref 8.9–10.3)
Chloride: 100 mmol/L (ref 98–111)
Creatinine, Ser: 21.67 mg/dL — ABNORMAL HIGH (ref 0.61–1.24)
GFR, Estimated: 2 mL/min — ABNORMAL LOW (ref >60)
Glucose, Bld: 78 mg/dL (ref 70–99)
Potassium: 5.8 mmol/L — ABNORMAL HIGH (ref 3.5–5.1)
Sodium: 140 mmol/L (ref 135–145)
Total Bilirubin: 0.6 mg/dL (ref 0.3–1.2)
Total Protein: 6.5 g/dL (ref 6.5–8.1)

## 2020-08-27 LAB — PROTIME-INR
INR: 1.3 — ABNORMAL HIGH (ref 0.8–1.2)
Prothrombin Time: 16.1 seconds — ABNORMAL HIGH (ref 11.4–15.2)

## 2020-08-27 LAB — HIV ANTIBODY (ROUTINE TESTING W REFLEX): HIV Screen 4th Generation wRfx: NONREACTIVE

## 2020-08-27 LAB — MRSA PCR SCREENING: MRSA by PCR: NEGATIVE

## 2020-08-27 MED ORDER — LIDOCAINE-PRILOCAINE 2.5-2.5 % EX CREA
1.0000 "application " | TOPICAL_CREAM | CUTANEOUS | Status: DC | PRN
  Filled 2020-08-27: qty 5

## 2020-08-27 MED ORDER — PENTAFLUOROPROP-TETRAFLUOROETH EX AERO
1.0000 "application " | INHALATION_SPRAY | CUTANEOUS | Status: DC | PRN
  Filled 2020-08-27: qty 30

## 2020-08-27 MED ORDER — HEPARIN SODIUM (PORCINE) 1000 UNIT/ML DIALYSIS: 1000.0000 [IU] | INTRAMUSCULAR | Status: DC | PRN

## 2020-08-27 MED ORDER — TECHNETIUM TC 99M MEBROFENIN IV KIT
5.1300 | PACK | Freq: Once | INTRAVENOUS | Status: AC | PRN
  Administered 2020-08-27: 5.13 via INTRAVENOUS

## 2020-08-27 MED ORDER — LIDOCAINE HCL (PF) 1 % IJ SOLN
5.0000 mL | INTRAMUSCULAR | Status: DC | PRN
  Filled 2020-08-27: qty 5

## 2020-08-27 MED ORDER — CHLORHEXIDINE GLUCONATE CLOTH 2 % EX PADS: 6.0000 | MEDICATED_PAD | Freq: Every day | CUTANEOUS | Status: DC

## 2020-08-27 MED ORDER — ALTEPLASE 2 MG IJ SOLR: 2.0000 mg | Freq: Once | INTRAMUSCULAR | Status: DC | PRN

## 2020-08-27 MED ORDER — SODIUM CHLORIDE 0.9 % IV SOLN: 100.0000 mL | INTRAVENOUS | Status: DC | PRN

## 2020-08-27 MED ORDER — ONDANSETRON 4 MG PO TBDP: 4.0000 mg | ORAL_TABLET | Freq: Four times a day (QID) | ORAL | 0 refills | Status: AC | PRN

## 2020-08-27 NOTE — Discharge Instructions (Signed)

## 2020-08-27 NOTE — Progress Notes (Signed)
[facility] SURGICAL PROGRESS NOTE (cpt [phone])  Hospital Day(s): 0.  Interval History: Patient seen and examined, no acute events or new complaints overnight. Patient reports he is feeling much better this morning and very anxious to get his imaging study done. He denies fever, chills, nausea, emesis, abdominal pain, or juandice. He is without leukocytosis with WBC at 6.7K. CMP is consistent with ESRD on HD. He is without hyperbilirubinemia. He is pending HIDA scan this morning.   Review of Systems:  Constitutional: denies fever, chills  HEENT: denies cough or congestion  Respiratory: denies any shortness of breath  Cardiovascular: denies chest pain or palpitations  Gastrointestinal: denies abdominal pain, N/V, or diarrhea/and bowel function as per interval history Genitourinary: denies burning with urination or urinary frequency Musculoskeletal: denies pain, decreased motor or sensation  Vital signs in last 24 hours: [min-max] current  Temp:  [98.3 F (36.8 C)-99.1 F (37.3 C)] 98.3 F (36.8 C) (04/14 0532) Pulse Rate:  [60-92] 60 (04/14 0532) Resp:  [14-18] 16 (04/14 0532) BP: (98-123)/(68-78) 122/68 (04/14 0532) SpO2:  [90 %-97 %] 94 % (04/14 0532) Weight:  [95.3 kg] 95.3 kg (04/13 1915)     Height: 5\' 7"  (170.2 cm) Weight: 95.3 kg BMI (Calculated): 32.88   Intake/Output last 2 shifts:  No intake/output data recorded.   Physical Exam:  Constitutional: alert, cooperative and no distress  HENT: normocephalic without obvious abnormality  Eyes: PERRL, EOM's grossly intact and symmetric, no scleral icterus Respiratory: breathing non-labored at rest  Cardiovascular: regular rate and sinus rhythm  Gastrointestinal: Soft, non-tender, and non-distended, no rebound/guarding Integumentary: Laparoscopic incisions are healing well, CDI, no drainage   Labs:  CBC Latest Ref Rng & Units 08/27/2020 08/26/2020 08/12/2020  WBC 4.0 - 10.5 K/uL 6.7 6.3 7.4  Hemoglobin 13.0 -  17.0 g/dL 11.1(L) 11.0(L) 12.3(L)  Hematocrit 39.0 - 52.0 % 33.3(L) 33.9(L) 38.5(L)  Platelets 150 - 400 K/uL 216 230 239   CMP Latest Ref Rng & Units 08/27/2020 08/26/2020 08/12/2020  Glucose 70 - 99 mg/dL 78 83 100(H)  BUN 6 - 20 mg/dL 84(H) 83(H) 72(H)  Creatinine 0.61 - 1.24 mg/dL 21.67(H) 20.80(H) 17.28(H)  Sodium 135 - 145 mmol/L 140 140 137  Potassium 3.5 - 5.1 mmol/L 5.8(H) 5.7(H) 5.1  Chloride 98 - 111 mmol/L 100 99 97(L)  CO2 22 - 32 mmol/L 24 26 27   Calcium 8.9 - 10.3 mg/dL 8.7(L) 8.7(L) 8.9  Total Protein 6.5 - 8.1 g/dL 6.5 6.9 7.2  Total Bilirubin 0.3 - 1.2 mg/dL 0.6 0.5 0.9  Alkaline Phos 38 - 126 U/L 44 45 52  AST 15 - 41 U/L 8(L) 10(L) 10(L)  ALT 0 - 44 U/L <5 <5 7     Imaging studies: No new pertinent imaging studies   Assessment/Plan: (ICD-10's: R10.11) 60 y.o. male 6 days s/p laparoscopic cholecystectomy at [facility] found to have fluid collection in the gallbladder fossa, most concerning for possible biloma   - Will get HIDa scan this morning to rule out biliary leak  - Continue NPO for scheduled imaging  - IVF support as needed  - Monitor abdominal examination  - Pain control prn; antiemetics prn   - Mobilization as toelrated   - Further management per primary service   - Discharge Planning: If HIDA is negative for bile leak, he should be able to discharge this afternoon given his significant clinical improvement today.    All of the above findings and recommendations were discussed with the patient, and the medical  team, and all of patient's questions were answered to his expressed satisfaction.   -- Edison Simon, PA-C [facility] 08/27/2020, 7:23 AM [phone] M-F: 7am - 4pm

## 2020-08-27 NOTE — Consult Note (Signed)
[place], [place] 08/27/20  Subjective:   LOS: 0  Tristan Bender is a 60 y.o. male with past medical history of hyperlipidemia, restless leg syndrome, s/p sleeve gastrectomy, osteoarthritis with CPAP, and ESRD on HD. He presents to ED with nausea, vomiting and abdominal pain.   Labs in ED Creatinine 20.8, BUN 83, and EGFR 2. Electrolytes are a sodium of 140 and potassium 5.7. He admits to missing dialysis on Tuesday due to not feeling well. He says he's feeling better today, actually requesting to go home today. Continues to complain of nausea, but states he's had that for days.  Last HD was 08/22/20  Objective:  Vital signs in last 24 hours:  Temp:  [98.2 F (36.8 C)-99.1 F (37.3 C)] 98.2 F (36.8 C) (04/14 0933) Pulse Rate:  [60-92] 79 (04/14 0933) Resp:  [14-20] 20 (04/14 0933) BP: (97-123)/(63-78) 97/63 (04/14 0933) SpO2:  [90 %-97 %] 95 % (04/14 0933) Weight:  [95.3 kg] 95.3 kg (04/13 1915)  Weight change:  Filed Weights   08/26/20 1915  Weight: 95.3 kg    Intake/Output:   No intake or output data in the 24 hours ending 08/27/20 1054   Physical Exam: General: No acute distress  HEENT Normocephalic, moist oral mucosa  Pulm/lungs Clear bilaterally, normal breathing pattern  CVS/Heart S1S2 present, regular rate  Abdomen:  Soft, non tender  Extremities: No peripheral edema  Neurologic: Alert and oriented  Skin: No rashes or lesions  Access: Lt AVF       Basic Metabolic Panel:  Recent Labs  Lab 08/26/20 2024 08/27/20 0501  NA 140 140  K 5.7* 5.8*  CL 99 100  CO2 26 24  GLUCOSE 83 78  BUN 83* 84*  CREATININE 20.80* 21.67*  CALCIUM 8.7* 8.7*     CBC: Recent Labs  Lab 08/26/20 2024 08/27/20 0501  WBC 6.3 6.7  NEUTROABS 4.5  --   HGB 11.0* 11.1*  HCT 33.9* 33.3*  MCV 86.5 85.2  PLT 230 216      Lab Results  Component Value Date   HEPBSAG Negative 06/30/2016      Microbiology:  Recent Results (from the  past 240 hour(s))  Resp Panel by RT-PCR (Flu A&B, Covid) Nasopharyngeal Swab     Status: None   Collection Time: 08/26/20 10:15 PM   Specimen: Nasopharyngeal Swab; Nasopharyngeal(NP) swabs in vial transport medium  Result Value Ref Range Status   SARS Coronavirus 2 by RT PCR NEGATIVE NEGATIVE Final    Comment: (NOTE) SARS-CoV-2 target nucleic acids are NOT DETECTED.  The SARS-CoV-2 RNA is generally detectable in upper respiratory specimens during the acute phase of infection. The lowest concentration of SARS-CoV-2 viral copies this assay can detect is 138 copies/mL. A negative result does not preclude SARS-Cov-2 infection and should not be used as the sole basis for treatment or other patient management decisions. A negative result may occur with  improper specimen collection/handling, submission of specimen other than nasopharyngeal swab, presence of viral mutation(s) within the areas targeted by this assay, and inadequate number of viral copies(<138 copies/mL). A negative result must be combined with clinical observations, patient history, and epidemiological information. The expected result is Negative.  Fact Sheet for Patients:  EntrepreneurPulse.com.au  Fact Sheet for Healthcare Providers:  IncredibleEmployment.be  This test is no t yet approved or cleared by the [place] FDA and  has been authorized for detection and/or diagnosis of SARS-CoV-2 by FDA under an Emergency Use Authorization (EUA). This EUA  will remain  in effect (meaning this test can be used) for the duration of the COVID-19 declaration under Section 564(b)(1) of the Act, 21 U.S.C.section 360bbb-3(b)(1), unless the authorization is terminated  or revoked sooner.       Influenza A by PCR NEGATIVE NEGATIVE Final   Influenza B by PCR NEGATIVE NEGATIVE Final    Comment: (NOTE) The Xpert Xpress SARS-CoV-2/FLU/RSV plus assay is intended as an aid in the diagnosis of  influenza from Nasopharyngeal swab specimens and should not be used as a sole basis for treatment. Nasal washings and aspirates are unacceptable for Xpert Xpress SARS-CoV-2/FLU/RSV testing.  Fact Sheet for Patients: EntrepreneurPulse.com.au  Fact Sheet for Healthcare Providers: IncredibleEmployment.be  This test is not yet approved or cleared by the [place] FDA and has been authorized for detection and/or diagnosis of SARS-CoV-2 by FDA under an Emergency Use Authorization (EUA). This EUA will remain in effect (meaning this test can be used) for the duration of the COVID-19 declaration under Section 564(b)(1) of the Act, 21 U.S.C. section 360bbb-3(b)(1), unless the authorization is terminated or revoked.  Performed at [facility], [address] [place], [place] [address]   MRSA PCR Screening     Status: None   Collection Time: 08/27/20 12:30 AM   Specimen: Nasopharyngeal  Result Value Ref Range Status   MRSA by PCR NEGATIVE NEGATIVE Final    Comment:        The GeneXpert MRSA Assay (FDA approved for NASAL specimens only), is one component of a comprehensive MRSA colonization surveillance program. It is not intended to diagnose MRSA infection nor to guide or monitor treatment for MRSA infections. Performed at [facility], [address] [place],  [address]     Coagulation Studies: Recent Labs    08/27/20 0501  LABPROT 16.1*  INR 1.3*    Urinalysis: No results for input(s): COLORURINE, LABSPEC, PHURINE, GLUCOSEU, HGBUR, BILIRUBINUR, KETONESUR, PROTEINUR, UROBILINOGEN, NITRITE, LEUKOCYTESUR in the last 72 hours.  Invalid input(s): APPERANCEUR    Imaging: CT Abdomen Pelvis Wo Contrast  Result Date: 08/26/2020 CLINICAL DATA:  Abdominal pain for 5 days, recent cholecystectomy EXAM: CT ABDOMEN AND PELVIS WITHOUT CONTRAST TECHNIQUE: Multidetector CT imaging of the abdomen and pelvis was performed  following the standard protocol without IV contrast. COMPARISON:  08/12/2020 FINDINGS: Lower chest: Trace bilateral pleural effusions. No acute airspace disease. Moderate hiatal hernia. Hepatobiliary: Postsurgical changes are seen from cholecystectomy, with 5.7 x 4.2 cm gas and fluid collection within the gallbladder fossa. Characterization is limited without IV contrast. Unenhanced imaging of the liver is unremarkable without biliary dilation. Pancreas: Unremarkable. No pancreatic ductal dilatation or surrounding inflammatory changes. Spleen: Normal in size without focal abnormality. Adrenals/Urinary Tract: Numerous there is bilateral renal atrophy, with cortical cysts. No urinary tract calculi or obstruction. Bladder is decompressed, limiting its evaluation. The adrenals are unremarkable. Stomach/Bowel: No bowel obstruction or ileus. No bowel wall thickening or inflammatory change. Postsurgical changes from previous bariatric surgery. Vascular/Lymphatic: Aortic atherosclerosis. No enlarged abdominal or pelvic lymph nodes. Reproductive: Prostate is unremarkable. Other: No free fluid or free gas. Fat containing left inguinal hernia. No bowel herniation. Musculoskeletal: No acute or destructive bony lesions. Reconstructed images demonstrate no additional findings. IMPRESSION: 1. Postsurgical changes from cholecystectomy, with 5.7 x 4.2 cm gas and fluid collection within the gallbladder fossa. Evaluation is limited without IV contrast, this could reflect postoperative hematoma or seroma, abscess, or biloma. 2. Trace bilateral pleural effusions. 3. Bilateral renal cortical atrophy and numerous renal cortical cysts,  consistent with end-stage renal disease. 4. Fat containing left inguinal hernia. 5.  Aortic Atherosclerosis (ICD10-I70.0). Electronically Signed   By: Randa Ngo M.D.   On: 08/26/2020 20:43     Medications:    . Chlorhexidine Gluconate Cloth  6 each Topical Q0600  . pregabalin  50 mg Oral Daily   . rOPINIRole  0.5 mg Oral BID  . rosuvastatin  20 mg Oral QHS   acetaminophen **OR** acetaminophen, ondansetron **OR** ondansetron (ZOFRAN) IV  Assessment/ Plan:  60 y.o. male with  was admitted on 08/26/2020 for  Principal Problem:   RUQ abdominal pain Active Problems:   ESRD on hemodialysis (HCC)   OSA on CPAP  RUQ abdominal pain [R10.11] ESRD (end stage renal disease) ([place]) [N18.6] Non-intractable vomiting with nausea, unspecified vomiting type [R11.2]  [facility] [address]/TTS/ Lt AVF  #. ESRD - Will continue dialysis on outpatient schedule  - Scheduled for dialysis today - UF goal 1-1.5 as tolerated - Next treatment scheduled for Saturday   #. Anemia of CKD  Lab Results  Component Value Date   HGB 11.1 (L) 08/27/2020   Within acceptable range  #. Secondary hyperparathyroidism of renal origin N 25.81   No results found for: PTH Lab Results  Component Value Date   PHOS 5.8 (H) 07/02/2016  Calcium acetate and Renvela outpatient Phosphorus elevated Monitor calcium and phos level during this admission  # Hyperkalemia Current level 5.8 Will correct with dialysis    LOS: 0 [place] 4/14/202210:54 [place], [place]

## 2020-08-27 NOTE — Plan of Care (Signed)
p Problem: Education: Goal: Knowledge of General Education information will improve Description: Including pain rating scale, medication(s)/side effects and non-pharmacologic comfort measures Outcome: Progressing   Problem: Health Behavior/Discharge Planning: Goal: Ability to manage health-related needs will improve Outcome: Progressing   Problem: Clinical Measurements: Goal: Ability to maintain clinical measurements within normal limits will improve Outcome: Progressing Goal: Will remain free from infection Outcome: Progressing Goal: Diagnostic test results will improve Outcome: Progressing Goal: Respiratory complications will improve Outcome: Progressing Goal: Cardiovascular complication will be avoided Outcome: Progressing   Problem: Activity: Goal: Risk for activity intolerance will decrease Outcome: Progressing   Problem: Nutrition: Goal: Adequate nutrition will be maintained Outcome: Not Progressing   Problem: Coping: Goal: Level of anxiety will decrease Outcome: Progressing   Problem: Elimination: Goal: Will not experience complications related to bowel motility Outcome: Progressing Goal: Will not experience complications related to urinary retention Outcome: Progressing   Problem: Pain Managment: Goal: General experience of comfort will improve Outcome: Progressing   Problem: Safety: Goal: Ability to remain free from injury will improve Outcome: Progressing   Problem: Skin Integrity: Goal: Risk for impaired skin integrity will decrease Outcome: Progressing   Patient is alert and oriented with complaints of nausea.  Zofran given.  Will continue to monitor.  Tristan Bender

## 2020-08-27 NOTE — Progress Notes (Signed)
Patient given instructions to make follow up appointment for PCP, IV taken out, when to return for worsening symptoms, Zofran prescription called in, & discharged ambulatory with Nurse tech.

## 2020-08-27 NOTE — Discharge Summary (Signed)
Tristan Bender ZWC:[id number] DOB: 1961/02/19 DOA: 08/26/2020  PCP: Jodi Marble, MD  Admit date: 08/26/2020 Discharge date: 08/27/2020  Admitted From: home Disposition:  home  Recommendations for Outpatient Follow-up:  1. Follow up with PCP in 1 week 2. Please obtain BMP/CBC in one week 3. Follow-up with his primary surgeon at [facility] as scheduled 4. Follow-up with his hemodialysis tomorrow.  Please note patient declined dialysis today.  Home Health:    Discharge Condition:Stable CODE STATUS: Full Diet recommendation: Renal low potassium diet    Brief/Interim Summary: Per HPI: Tristan Bender is a 60 y.o. male with medical history significant for ESRD on TTS HD, HLD, RLS, s/p sleeve gastrectomy, OSA on CPAP, who underwent recent laparoscopic cholecystectomy and presents with nausea, vomiting, abdominal pain.  Patient recently admitted at [facility] in [place] 08/20/2020-08/22/2020 for cholelithiasis with biliary colic.  He underwent laparoscopic cholecystectomy on 4/8 and discharged the following day.  Per review of records in care everywhere, he was tolerating oral intake at time of discharge.  Patient states that since his cholecystectomy he has been having an uneasy feeling in his stomach.  He has been having intermittent pain in his right upper quadrant of the abdomen.  He has felt nauseous and has been afraid to eat due to vomiting when he tries solid foods.  He says he has been tolerating sips of water and has been able to take his medications regularly.  He says he had his first bowel movement yesterday after about 10 days which appeared normal.  He has not had any chest pain, dyspnea, subjective fevers, chills, diaphoresis.  He says he no longer makes urine.    He says his last dialysis session was on 4/9.  He says he felt too unwell to attend his usual dialysis this past Tuesday 4/12.  CT abdomen/pelvis without contrast showed postsurgical changes from cholecystectomy with  5.7 x 4.2 cm gas and fluid collection within the gallbladder fossa.  This could reflect postoperative hematoma or seroma, abscess, or biloma.  EDP discussed with [facility] for transfer however they were unable to accept patient.  EDP discussed with on-call general surgery, Dr. Dahlia Byes, who agreed to consult on patient here and recommended obtaining HIDA scan and medical admission.  The patient was given oral Veltassa and IV Compazine.  The hospitalist service was consulted to admit for further evaluation and management.   General surgery was consulted.  Surgery ordered HIDA and it was negative for bile leak.  They were okay with patient to be discharged home with follow-up with the primary surgeon.  Per patient his primary surgeon had told him to stay on a liquid diet until the next visit with him. Also nephrology was consulted as patient is on dialysis.  His potassium was 5.8.  They had set up for dialysis for today while he was inpatient  but patient declined and he has dialysis set up for tomorrow which he prefers going at clinic.  To [name] is the dialysis coordinator and she is aware with patient being discharged and the dialysis center will call patient for time for him to go for dialysis in a.m.  Nephrology and surgery cleared patient via chat for discharge.  Discharge Diagnoses:  Principal Problem:   RUQ abdominal pain Active Problems:   ESRD on hemodialysis (HCC)   OSA on CPAP    Discharge Instructions  Discharge Instructions    Call MD for:  severe uncontrolled pain   Complete by: As directed  Diet - low sodium heart healthy   Complete by: As directed    Discharge instructions   Complete by: As directed    Make sure you go to dialysis tomorrow as your potassium is high Dont eat bananas or potato is high in potassium Follow up with your [facility]   Increase activity slowly   Complete by: As directed    No wound care   Complete by: As directed       Allergies as of 08/27/2020   No Known Allergies     Medication List    TAKE these medications   calcium acetate 667 MG capsule Commonly known as: PHOSLO Take 2,668 mg by mouth 3 (three) times daily with meals.   cinacalcet 60 MG tablet Commonly known as: SENSIPAR Take 60 mg by mouth daily.   ferrous sulfate 325 (65 FE) MG tablet Take 1 tablet (325 mg total) by mouth 2 (two) times daily with a meal.   lidocaine-prilocaine cream Commonly known as: EMLA SMARTSIG:Sparingly Topical   ondansetron 4 MG disintegrating tablet Commonly known as: Zofran ODT Take 1 tablet (4 mg total) by mouth every 6 (six) hours as needed for nausea or vomiting.   pregabalin 50 MG capsule Commonly known as: LYRICA Take 50 mg by mouth daily.   rOPINIRole 0.5 MG tablet Commonly known as: REQUIP Take 0.5 mg by mouth 2 (two) times daily.   rosuvastatin 20 MG tablet Commonly known as: CRESTOR Take 20 mg by mouth at bedtime.   sevelamer carbonate 800 MG tablet Commonly known as: RENVELA Take 1,600 mg by mouth 3 (three) times daily as needed.   VITAMIN D PO Take 1 tablet by mouth daily.       Follow-up Information    Jodi Marble, MD Follow up in 1 week(s).   Specialty: Internal Medicine Contact information: [address] [place] [address] [phone]              No Known Allergies  Consultations:  Nephrology and surgery   Procedures/Studies: CT Abdomen Pelvis Wo Contrast  Result Date: 08/26/2020 CLINICAL DATA:  Abdominal pain for 5 days, recent cholecystectomy EXAM: CT ABDOMEN AND PELVIS WITHOUT CONTRAST TECHNIQUE: Multidetector CT imaging of the abdomen and pelvis was performed following the standard protocol without IV contrast. COMPARISON:  08/12/2020 FINDINGS: Lower chest: Trace bilateral pleural effusions. No acute airspace disease. Moderate hiatal hernia. Hepatobiliary: Postsurgical changes are seen from cholecystectomy, with 5.7 x 4.2 cm gas and fluid  collection within the gallbladder fossa. Characterization is limited without IV contrast. Unenhanced imaging of the liver is unremarkable without biliary dilation. Pancreas: Unremarkable. No pancreatic ductal dilatation or surrounding inflammatory changes. Spleen: Normal in size without focal abnormality. Adrenals/Urinary Tract: Numerous there is bilateral renal atrophy, with cortical cysts. No urinary tract calculi or obstruction. Bladder is decompressed, limiting its evaluation. The adrenals are unremarkable. Stomach/Bowel: No bowel obstruction or ileus. No bowel wall thickening or inflammatory change. Postsurgical changes from previous bariatric surgery. Vascular/Lymphatic: Aortic atherosclerosis. No enlarged abdominal or pelvic lymph nodes. Reproductive: Prostate is unremarkable. Other: No free fluid or free gas. Fat containing left inguinal hernia. No bowel herniation. Musculoskeletal: No acute or destructive bony lesions. Reconstructed images demonstrate no additional findings. IMPRESSION: 1. Postsurgical changes from cholecystectomy, with 5.7 x 4.2 cm gas and fluid collection within the gallbladder fossa. Evaluation is limited without IV contrast, this could reflect postoperative hematoma or seroma, abscess, or biloma. 2. Trace bilateral pleural effusions. 3. Bilateral renal cortical atrophy and numerous renal  cortical cysts, consistent with end-stage renal disease. 4. Fat containing left inguinal hernia. 5.  Aortic Atherosclerosis (ICD10-I70.0). Electronically Signed   By: Randa Ngo M.D.   On: 08/26/2020 20:43   DG Chest Portable 1 View  Result Date: 08/12/2020 CLINICAL DATA:  Hypoxia, hypotension EXAM: PORTABLE CHEST 1 VIEW COMPARISON:  None. FINDINGS: Lungs volumes are small, but are symmetric and are clear. No pneumothorax or pleural effusion. Cardiac size within normal limits. Pulmonary vascularity is normal. Osseous structures are age-appropriate. No acute bone abnormality. IMPRESSION: No  active disease. Electronically Signed   By: Fidela Salisbury MD   On: 08/12/2020 03:28   NM HEPATOBILIARY LEAK (POST-SURGICAL)  Result Date: 08/27/2020 CLINICAL DATA:  Chronic upper abdominal pain, cholecystectomy on 08/21/2020, query bile leak EXAM: NUCLEAR MEDICINE HEPATOBILIARY IMAGING TECHNIQUE: Sequential images of the abdomen were obtained out to 60 minutes following intravenous administration of radiopharmaceutical. RADIOPHARMACEUTICALS:  5.1 mCi Tc-30m  Choletec IV COMPARISON:  CT abdomen 08/26/2020 FINDINGS: No bile leak is identified. Although there is some notable activity along the inferior margin of the left hepatic lobe on earlier images, this substantially diminishes on later images and thus is ascribed to being activity in the left hepatic duct rather than leaked activity, and no activity extends down along the right paracolic gutter or in typical collection locations. There is satisfactory uptake of radiopharmaceutical from the blood pool, with biliary activity visible at 7 minutes and bowel activity visible at 18 minutes. IMPRESSION: 1. No bile leak is identified. Satisfactory hepatocellular uptake of radiopharmaceutical and expected patency of the common bile duct. Electronically Signed   By: Van Clines M.D.   On: 08/27/2020 12:45   US Abdomen Limited RUQ (LIVER/GB)  Result Date: 08/12/2020 CLINICAL DATA:  Upper abdominal pain EXAM: ULTRASOUND ABDOMEN LIMITED RIGHT UPPER QUADRANT COMPARISON:  None. FINDINGS: Gallbladder: A mobile 13 mm gallstone is seen within the gallbladder lumen. The gallbladder, however, is not distended, there is no gallbladder wall thickening, and no pericholecystic fluid is identified. The sonographic Percell Miller sign is reportedly negative. Common bile duct: Diameter: 3 mm in proximal diameter. Liver: Hepatic parenchymal echogenicity and echotexture is normal. There is, however, subtle nodularity of the a hepatic contour suggesting changes of underlying cirrhosis.  No focal intrahepatic masses are seen and there is no intrahepatic biliary ductal dilation. Portal vein is patent on color Doppler imaging with normal direction of blood flow towards the liver. Other: The right kidney demonstrates marked cortical thinning with multiple simple cortical cysts seen in keeping with the given history of end-stage renal disease. IMPRESSION: Cholelithiasis without sonographic evidence of acute cholecystitis. Hepatic parenchymal changes in keeping with cirrhosis. No focal hepatic mass identified. Parenchymal changes within the right kidney in keeping with chronic renal failure. Electronically Signed   By: Fidela Salisbury MD   On: 08/12/2020 04:57       Subjective:  No further n/v. Feels better. No abdominal pain  Discharge Exam: Vitals:   08/27/20 1313 08/27/20 1544  BP: 102/72 103/80  Pulse: 66 71  Resp: 18 16  Temp: 99 F (37.2 C) 98.9 F (37.2 C)  SpO2: 93% 92%   Vitals:   08/27/20 0532 08/27/20 0933 08/27/20 1313 08/27/20 1544  BP: 122/68 97/63 102/72 103/80  Pulse: 60 79 66 71  Resp: 16 20 18 16   Temp: 98.3 F (36.8 C) 98.2 F (36.8 C) 99 F (37.2 C) 98.9 F (37.2 C)  TempSrc: Oral Oral Oral   SpO2: 94% 95% 93% 92%  Weight:  Height:        General: Pt is alert, awake, not in acute distress Cardiovascular: RRR, S1/S2 +, no rubs, no gallops Respiratory: CTA bilaterally, no wheezing, no rhonchi Abdominal: Soft, NT, ND, bowel sounds + Extremities: no edema    The results of significant diagnostics from this hospitalization (including imaging, microbiology, ancillary and laboratory) are listed below for reference.     Microbiology: Recent Results (from the past 240 hour(s))  Resp Panel by RT-PCR (Flu A&B, Covid) Nasopharyngeal Swab     Status: None   Collection Time: 08/26/20 10:15 PM   Specimen: Nasopharyngeal Swab; Nasopharyngeal(NP) swabs in vial transport medium  Result Value Ref Range Status   SARS Coronavirus 2 by RT PCR  NEGATIVE NEGATIVE Final    Comment: (NOTE) SARS-CoV-2 target nucleic acids are NOT DETECTED.  The SARS-CoV-2 RNA is generally detectable in upper respiratory specimens during the acute phase of infection. The lowest concentration of SARS-CoV-2 viral copies this assay can detect is 138 copies/mL. A negative result does not preclude SARS-Cov-2 infection and should not be used as the sole basis for treatment or other patient management decisions. A negative result may occur with  improper specimen collection/handling, submission of specimen other than nasopharyngeal swab, presence of viral mutation(s) within the areas targeted by this assay, and inadequate number of viral copies(<138 copies/mL). A negative result must be combined with clinical observations, patient history, and epidemiological information. The expected result is Negative.  Fact Sheet for Patients:  EntrepreneurPulse.com.au  Fact Sheet for Healthcare Providers:  IncredibleEmployment.be  This test is no t yet approved or cleared by the [place] FDA and  has been authorized for detection and/or diagnosis of SARS-CoV-2 by FDA under an Emergency Use Authorization (EUA). This EUA will remain  in effect (meaning this test can be used) for the duration of the COVID-19 declaration under Section 564(b)(1) of the Act, 21 U.S.C.section 360bbb-3(b)(1), unless the authorization is terminated  or revoked sooner.       Influenza A by PCR NEGATIVE NEGATIVE Final   Influenza B by PCR NEGATIVE NEGATIVE Final    Comment: (NOTE) The Xpert Xpress SARS-CoV-2/FLU/RSV plus assay is intended as an aid in the diagnosis of influenza from Nasopharyngeal swab specimens and should not be used as a sole basis for treatment. Nasal washings and aspirates are unacceptable for Xpert Xpress SARS-CoV-2/FLU/RSV testing.  Fact Sheet for Patients: EntrepreneurPulse.com.au  Fact Sheet for  Healthcare Providers: IncredibleEmployment.be  This test is not yet approved or cleared by the [place] FDA and has been authorized for detection and/or diagnosis of SARS-CoV-2 by FDA under an Emergency Use Authorization (EUA). This EUA will remain in effect (meaning this test can be used) for the duration of the COVID-19 declaration under Section 564(b)(1) of the Act, 21 U.S.C. section 360bbb-3(b)(1), unless the authorization is terminated or revoked.  Performed at [facility], [address] [place], [place] [address]   MRSA PCR Screening     Status: None   Collection Time: 08/27/20 12:30 AM   Specimen: Nasopharyngeal  Result Value Ref Range Status   MRSA by PCR NEGATIVE NEGATIVE Final    Comment:        The GeneXpert MRSA Assay (FDA approved for NASAL specimens only), is one component of a comprehensive MRSA colonization surveillance program. It is not intended to diagnose MRSA infection nor to guide or monitor treatment for MRSA infections. Performed at [facility], [address] [place], [place] [address]  Labs: BNP (last 3 results) No results for input(s): BNP in the last 8760 hours. Basic Metabolic Panel: Recent Labs  Lab 08/26/20 2024 08/27/20 0501  NA 140 140  K 5.7* 5.8*  CL 99 100  CO2 26 24  GLUCOSE 83 78  BUN 83* 84*  CREATININE 20.80* 21.67*  CALCIUM 8.7* 8.7*   Liver Function Tests: Recent Labs  Lab 08/26/20 2024 08/27/20 0501  AST 10* 8*  ALT <5 <5  ALKPHOS 45 44  BILITOT 0.5 0.6  PROT 6.9 6.5  ALBUMIN 3.6 3.4*   Recent Labs  Lab 08/26/20 2024  LIPASE 35   No results for input(s): AMMONIA in the last 168 hours. CBC: Recent Labs  Lab 08/26/20 2024 08/27/20 0501  WBC 6.3 6.7  NEUTROABS 4.5  --   HGB 11.0* 11.1*  HCT 33.9* 33.3*  MCV 86.5 85.2  PLT 230 216   Cardiac Enzymes: No results for input(s): CKTOTAL, CKMB, CKMBINDEX, TROPONINI in the last 168 hours. BNP: Invalid  input(s): POCBNP CBG: No results for input(s): GLUCAP in the last 168 hours. D-Dimer No results for input(s): DDIMER in the last 72 hours. Hgb A1c No results for input(s): HGBA1C in the last 72 hours. Lipid Profile No results for input(s): CHOL, HDL, LDLCALC, TRIG, CHOLHDL, LDLDIRECT in the last 72 hours. Thyroid function studies No results for input(s): TSH, T4TOTAL, T3FREE, THYROIDAB in the last 72 hours.  Invalid input(s): FREET3 Anemia work up No results for input(s): VITAMINB12, FOLATE, FERRITIN, TIBC, IRON, RETICCTPCT in the last 72 hours. Urinalysis No results found for: COLORURINE, APPEARANCEUR, [place], [place], GLUCOSEU, [place], [place], [place], PROTEINUR, UROBILINOGEN, NITRITE, LEUKOCYTESUR Sepsis Labs Invalid input(s): PROCALCITONIN,  WBC,  LACTICIDVEN Microbiology Recent Results (from the past 240 hour(s))  Resp Panel by RT-PCR (Flu A&B, Covid) Nasopharyngeal Swab     Status: None   Collection Time: 08/26/20 10:15 PM   Specimen: Nasopharyngeal Swab; Nasopharyngeal(NP) swabs in vial transport medium  Result Value Ref Range Status   SARS Coronavirus 2 by RT PCR NEGATIVE NEGATIVE Final    Comment: (NOTE) SARS-CoV-2 target nucleic acids are NOT DETECTED.  The SARS-CoV-2 RNA is generally detectable in upper respiratory specimens during the acute phase of infection. The lowest concentration of SARS-CoV-2 viral copies this assay can detect is 138 copies/mL. A negative result does not preclude SARS-Cov-2 infection and should not be used as the sole basis for treatment or other patient management decisions. A negative result may occur with  improper specimen collection/handling, submission of specimen other than nasopharyngeal swab, presence of viral mutation(s) within the areas targeted by this assay, and inadequate number of viral copies(<138 copies/mL). A negative result must be combined with clinical observations, patient history, and epidemiological information.  The expected result is Negative.  Fact Sheet for Patients:  EntrepreneurPulse.com.au  Fact Sheet for Healthcare Providers:  IncredibleEmployment.be  This test is no t yet approved or cleared by the [place] FDA and  has been authorized for detection and/or diagnosis of SARS-CoV-2 by FDA under an Emergency Use Authorization (EUA). This EUA will remain  in effect (meaning this test can be used) for the duration of the COVID-19 declaration under Section 564(b)(1) of the Act, 21 U.S.C.section 360bbb-3(b)(1), unless the authorization is terminated  or revoked sooner.       Influenza A by PCR NEGATIVE NEGATIVE Final   Influenza B by PCR NEGATIVE NEGATIVE Final    Comment: (NOTE) The Xpert Xpress SARS-CoV-2/FLU/RSV plus assay is intended as an aid in the diagnosis of  influenza from Nasopharyngeal swab specimens and should not be used as a sole basis for treatment. Nasal washings and aspirates are unacceptable for Xpert Xpress SARS-CoV-2/FLU/RSV testing.  Fact Sheet for Patients: EntrepreneurPulse.com.au  Fact Sheet for Healthcare Providers: IncredibleEmployment.be  This test is not yet approved or cleared by the [place] FDA and has been authorized for detection and/or diagnosis of SARS-CoV-2 by FDA under an Emergency Use Authorization (EUA). This EUA will remain in effect (meaning this test can be used) for the duration of the COVID-19 declaration under Section 564(b)(1) of the Act, 21 U.S.C. section 360bbb-3(b)(1), unless the authorization is terminated or revoked.  Performed at [facility], [address] [place], [place] [address]   MRSA PCR Screening     Status: None   Collection Time: 08/27/20 12:30 AM   Specimen: Nasopharyngeal  Result Value Ref Range Status   MRSA by PCR NEGATIVE NEGATIVE Final    Comment:        The GeneXpert MRSA Assay (FDA approved for NASAL  specimens only), is one component of a comprehensive MRSA colonization surveillance program. It is not intended to diagnose MRSA infection nor to guide or monitor treatment for MRSA infections. Performed at [facility], [address], [place], [place] [address]      Time coordinating discharge: Over 30 minutes  SIGNED:   Nolberto Hanlon, MD  [facility] 08/27/2020, 4:06 PM Pager   If 7PM-7AM, please contact night-coverage [email or web address] Password [email or web address]

## 2020-08-27 NOTE — Progress Notes (Signed)
Hemodialysis patient known at [facility] 6:30, patient drives self. Plan is for patient to get a treatment tomorrow at clinic, time is unknown at the moment. Clinic will contact patient tomorrow with time. Please contact me with any dialysis concerns.  Tristan Bender  [facility] [phone]

## 2020-09-04 DIAGNOSIS — G8929 Other chronic pain: Secondary | ICD-10-CM | POA: Insufficient documentation

## 2020-09-06 ENCOUNTER — Observation Stay
Admission: EM | Admit: 2020-09-06 | Discharge: 2020-09-07 | Disposition: A | Discharge status: Home / Self Care | Payer: Medicare Other | Attending: Internal Medicine | Admitting: Internal Medicine

## 2020-09-06 ENCOUNTER — Other Ambulatory Visit: Payer: Self-pay

## 2020-09-06 DIAGNOSIS — Z992 Dependence on renal dialysis: Secondary | ICD-10-CM

## 2020-09-06 DIAGNOSIS — N186 End stage renal disease: Secondary | ICD-10-CM | POA: Diagnosis not present

## 2020-09-06 DIAGNOSIS — K920 Hematemesis: Secondary | ICD-10-CM | POA: Diagnosis present

## 2020-09-06 DIAGNOSIS — D132 Benign neoplasm of duodenum: Principal | ICD-10-CM | POA: Insufficient documentation

## 2020-09-06 DIAGNOSIS — R042 Hemoptysis: Secondary | ICD-10-CM | POA: Diagnosis not present

## 2020-09-06 DIAGNOSIS — I120 Hypertensive chronic kidney disease with stage 5 chronic kidney disease or end stage renal disease: Secondary | ICD-10-CM | POA: Insufficient documentation

## 2020-09-06 DIAGNOSIS — Z79899 Other long term (current) drug therapy: Secondary | ICD-10-CM | POA: Diagnosis not present

## 2020-09-06 DIAGNOSIS — K2100 Gastro-esophageal reflux disease with esophagitis, without bleeding: Secondary | ICD-10-CM | POA: Insufficient documentation

## 2020-09-06 DIAGNOSIS — K3189 Other diseases of stomach and duodenum: Secondary | ICD-10-CM | POA: Insufficient documentation

## 2020-09-06 DIAGNOSIS — K279 Peptic ulcer, site unspecified, unspecified as acute or chronic, without hemorrhage or perforation: Secondary | ICD-10-CM

## 2020-09-06 DIAGNOSIS — G4733 Obstructive sleep apnea (adult) (pediatric): Secondary | ICD-10-CM

## 2020-09-06 DIAGNOSIS — Z903 Acquired absence of stomach [part of]: Secondary | ICD-10-CM | POA: Diagnosis not present

## 2020-09-06 DIAGNOSIS — Z20822 Contact with and (suspected) exposure to covid-19: Secondary | ICD-10-CM | POA: Diagnosis not present

## 2020-09-06 DIAGNOSIS — Z9989 Dependence on other enabling machines and devices: Secondary | ICD-10-CM

## 2020-09-06 DIAGNOSIS — K922 Gastrointestinal hemorrhage, unspecified: Secondary | ICD-10-CM | POA: Diagnosis not present

## 2020-09-06 HISTORY — DX: Hemoptysis: R04.2

## 2020-09-06 LAB — CBC
HCT: 30.1 % — ABNORMAL LOW (ref 39.0–52.0)
Hemoglobin: 9.6 g/dL — ABNORMAL LOW (ref 13.0–17.0)
MCH: 27.8 pg (ref 26.0–34.0)
MCHC: 31.9 g/dL (ref 30.0–36.0)
MCV: 87.2 fL (ref 80.0–100.0)
Platelets: 229 10*3/uL (ref 150–400)
RBC: 3.45 MIL/uL — ABNORMAL LOW (ref 4.22–5.81)
RDW: 14.9 % (ref 11.5–15.5)
WBC: 6 10*3/uL (ref 4.0–10.5)
nRBC: 0 % (ref 0.0–0.2)

## 2020-09-06 LAB — LIPASE, BLOOD: Lipase: 34 U/L (ref 11–51)

## 2020-09-06 LAB — TYPE AND SCREEN
ABO/RH(D): A POS
Antibody Screen: NEGATIVE

## 2020-09-06 LAB — COMPREHENSIVE METABOLIC PANEL
ALT: 7 U/L (ref 0–44)
AST: 17 U/L (ref 15–41)
Albumin: 3.8 g/dL (ref 3.5–5.0)
Alkaline Phosphatase: 38 U/L (ref 38–126)
Anion gap: 13 (ref 5–15)
BUN: 40 mg/dL — ABNORMAL HIGH (ref 6–20)
CO2: 31 mmol/L (ref 22–32)
Calcium: 8.9 mg/dL (ref 8.9–10.3)
Chloride: 95 mmol/L — ABNORMAL LOW (ref 98–111)
Creatinine, Ser: 9.98 mg/dL — ABNORMAL HIGH (ref 0.61–1.24)
GFR, Estimated: 5 mL/min — ABNORMAL LOW (ref >60)
Glucose, Bld: 83 mg/dL (ref 70–99)
Potassium: 5 mmol/L (ref 3.5–5.1)
Sodium: 139 mmol/L (ref 135–145)
Total Bilirubin: 0.7 mg/dL (ref 0.3–1.2)
Total Protein: 7.2 g/dL (ref 6.5–8.1)

## 2020-09-06 LAB — PROTIME-INR
INR: 1.1 (ref 0.8–1.2)
Prothrombin Time: 13.9 seconds (ref 11.4–15.2)

## 2020-09-06 LAB — APTT: aPTT: 34 seconds (ref 24–36)

## 2020-09-06 MED ORDER — CINACALCET HCL 30 MG PO TABS: 60.0000 mg | ORAL_TABLET | Freq: Every day | ORAL | Status: DC

## 2020-09-06 MED ORDER — PREGABALIN 50 MG PO CAPS
50.0000 mg | ORAL_CAPSULE | Freq: Every day | ORAL | Status: DC
  Administered 2020-09-07: 50 mg via ORAL
  Filled 2020-09-06: qty 1

## 2020-09-06 MED ORDER — ROPINIROLE HCL 1 MG PO TABS
0.5000 mg | ORAL_TABLET | Freq: Two times a day (BID) | ORAL | Status: DC
  Administered 2020-09-07: 0.5 mg via ORAL
  Filled 2020-09-06: qty 1

## 2020-09-06 MED ORDER — SUCRALFATE 1 GM/10ML PO SUSP
1.0000 g | Freq: Once | ORAL | Status: DC
  Filled 2020-09-06: qty 10

## 2020-09-06 MED ORDER — FERROUS SULFATE 325 (65 FE) MG PO TABS: 325.0000 mg | ORAL_TABLET | Freq: Two times a day (BID) | ORAL | Status: DC

## 2020-09-06 MED ORDER — PANTOPRAZOLE SODIUM 40 MG IV SOLR
40.0000 mg | Freq: Once | INTRAVENOUS | Status: AC
  Administered 2020-09-06: 40 mg via INTRAVENOUS
  Filled 2020-09-06: qty 40

## 2020-09-06 MED ORDER — PANTOPRAZOLE SODIUM 40 MG IV SOLR
40.0000 mg | INTRAVENOUS | Status: DC
  Administered 2020-09-07: 40 mg via INTRAVENOUS
  Filled 2020-09-06: qty 40

## 2020-09-06 MED ORDER — ONDANSETRON HCL 4 MG/2ML IJ SOLN
4.0000 mg | Freq: Four times a day (QID) | INTRAMUSCULAR | Status: DC | PRN
  Administered 2020-09-06 – 2020-09-07 (×2): 4 mg via INTRAVENOUS
  Filled 2020-09-06 (×2): qty 2

## 2020-09-06 MED ORDER — ROSUVASTATIN CALCIUM 10 MG PO TABS
10.0000 mg | ORAL_TABLET | Freq: Every day | ORAL | Status: DC
  Filled 2020-09-06: qty 0.5

## 2020-09-06 NOTE — ED Triage Notes (Signed)
Pt states he has been vomiting for 2 weeks but in the last couple days he has noticed blood in his vomit- pt states it is bright red blood- pt states he has not been able to hold anything down- denies abdominal pain and fevers

## 2020-09-06 NOTE — ED Notes (Signed)
This RN contacted pharmacy to verify pt medication

## 2020-09-06 NOTE — ED Notes (Signed)
Pt asking about room assignment and then RN explained to pt that he may not get a room tonight and that he may have to wait until the morning. Pt understands the process, but is very irritated about not having a room assignment yet.

## 2020-09-06 NOTE — ED Notes (Signed)
Pt complaining of nausea.   

## 2020-09-06 NOTE — ED Provider Notes (Signed)
[facility] Emergency Department Provider Note   ____________________________________________    I have reviewed the triage vital signs and the nursing notes.   HISTORY  Chief Complaint Hematemesis     HPI Tristan Bender is a 60 y.o. male who presents with complaints of hematemesis.  Patient reports in December 2021 he had a gastric sleeve, had been doing well until he developed nausea vomiting whenever he tried to eat anything, had additional testing done and it was thought to be related to his gallbladder.,  Had cholecystectomy but continues to have difficulty with p.o. intake, over the last 24 hours has had 2 episodes of hematemesis with bright red blood.  Reports normal stools.  Denies epigastric pain at this time.  Past Medical History:  Diagnosis Date  . Adenomatous duodenal polyp    jan 2018[facility]  . Hypertension   . Renal disorder     Patient Active Problem List   Diagnosis Date Noted  . Hemoptysis 09/06/2020  . RUQ abdominal pain 08/26/2020  . CPAP (continuous positive airway pressure) dependence 07/13/2020  . Chronic kidney disease requiring chronic dialysis ([place]) 05/18/2020  . Restless legs syndrome 05/18/2020  . OSA on CPAP 10/29/2019  . BMI 45.0-49.9, adult ([place]) 03/11/2019  . Anemia 07/21/2016  . Iron deficiency anemia due to chronic blood loss   . Other diseases of stomach and duodenum   . Chronic duodenal ulcer with hemorrhage   . GI bleed 06/30/2016  . Symptomatic anemia 06/30/2016  . Class 3 obesity with body mass index (BMI) of 40.0 to 44.9 in adult 06/10/2016  . ESRD on hemodialysis ([place]) 05/16/2016  . Anemia in chronic kidney disease 09/30/2015  . Iron deficiency 04/02/2015  . HTN (hypertension) 03/20/2015    Past Surgical History:  Procedure Laterality Date  . ABDOMINAL SURGERY    . CHOLECYSTECTOMY    . COLON SURGERY    . DIALYSIS/PERMA CATHETER INSERTION N/A 06/30/2016   Procedure: Dialysis/Perma Catheter  Insertion;  Surgeon: Algernon Huxley, MD;  Location: [place] CV LAB;  Service: Cardiovascular;  Laterality: N/A;  . ESOPHAGOGASTRODUODENOSCOPY (EGD) WITH PROPOFOL N/A 07/01/2016   Procedure: ESOPHAGOGASTRODUODENOSCOPY (EGD) WITH PROPOFOL;  Surgeon: Lucilla Lame, MD;  Location: [facility] ENDOSCOPY;  Service: Endoscopy;  Laterality: N/A;  . LAPAROSCOPIC GASTRIC SLEEVE RESECTION      Prior to Admission medications   Medication Sig Start Date End Date Taking? Authorizing Provider  calcium acetate (PHOSLO) 667 MG capsule Take 2,668 mg by mouth 3 (three) times daily with meals. Patient not taking: Reported on 08/26/2020    [provider]  Cholecalciferol (VITAMIN D PO) Take 1 tablet by mouth daily.    [provider]  cinacalcet (SENSIPAR) 60 MG tablet Take 60 mg by mouth daily.    [provider]  ferrous sulfate 325 (65 FE) MG tablet Take 1 tablet (325 mg total) by mouth 2 (two) times daily with a meal. 07/03/16   Vaughan Basta, MD  lidocaine-prilocaine (EMLA) cream SMARTSIG:Sparingly Topical 03/04/20   [provider]  pregabalin (LYRICA) 50 MG capsule Take 50 mg by mouth daily. 02/09/19   [provider]  rOPINIRole (REQUIP) 0.5 MG tablet Take 0.5 mg by mouth 2 (two) times daily. 02/04/20   [provider]  rosuvastatin (CRESTOR) 20 MG tablet Take 20 mg by mouth at bedtime. 12/18/19   [provider]  sevelamer carbonate (RENVELA) 800 MG tablet Take 1,600 mg by mouth 3 (three) times daily as needed. Patient not taking:  Reported on 08/26/2020 06/16/16   [provider]     Allergies Patient has no known allergies.  Family History  Problem Relation Age of Onset  . Renal Disease Maternal Uncle     Social History Social History   Tobacco Use  . Smoking status: Never Smoker  . Smokeless tobacco: Never Used  Substance Use Topics  . Alcohol use: No  . Drug use: No    Review of Systems  Constitutional: No  fever/chills Eyes: No visual changes.  ENT: No sore throat. Cardiovascular: Denies chest pain. Respiratory: Denies shortness of breath. Gastrointestinal: As above Genitourinary: Negative for dysuria. Musculoskeletal: Negative for back pain. Skin: Negative for rash. Neurological: Negative for headaches or weakness   ____________________________________________   PHYSICAL EXAM:  VITAL SIGNS: ED Triage Vitals  Enc Vitals Group     BP 09/06/20 1656 107/87     Pulse Rate 09/06/20 1656 84     Resp 09/06/20 1656 16     Temp 09/06/20 1656 98.8 F (37.1 C)     Temp Source 09/06/20 1656 Oral     SpO2 09/06/20 1656 99 %     Weight 09/06/20 1657 94.3 kg (208 lb)     Height 09/06/20 1657 1.702 m (5\' 7" )     Head Circumference --      Peak Flow --      Pain Score 09/06/20 1657 0     Pain Loc --      Pain Edu? --      Excl. in [place]? --     Constitutional: Alert and oriented.   Nose: No congestion/rhinnorhea. Mouth/Throat: Mucous membranes are moist.    Cardiovascular: Normal rate, regular rhythm. Kermit Balo peripheral circulation. Respiratory: Normal respiratory effort.  No retractions. Lungs CTAB. Gastrointestinal: Soft and nontender. No distention.  No CVA tenderness. Musculoskeletal: No lower extremity tenderness nor edema.  Warm and well perfused Neurologic:  Normal speech and language. No gross focal neurologic deficits are appreciated.  Skin:  Skin is warm, dry and intact. No rash noted. Psychiatric: Mood and affect are normal. Speech and behavior are normal.  ____________________________________________   LABS (all labs ordered are listed, but only abnormal results are displayed)  Labs Reviewed  COMPREHENSIVE METABOLIC PANEL - Abnormal; Notable for the following components:      Result Value   Chloride 95 (*)    BUN 40 (*)    Creatinine, Ser 9.98 (*)    GFR, Estimated 5 (*)    All other components within normal limits  CBC - Abnormal; Notable for the following  components:   RBC 3.45 (*)    Hemoglobin 9.6 (*)    HCT 30.1 (*)    All other components within normal limits  SARS CORONAVIRUS 2 (TAT 6-24 HRS)  LIPASE, BLOOD  APTT  PROTIME-INR  COMPREHENSIVE METABOLIC PANEL  TYPE AND SCREEN   ____________________________________________  EKG   ____________________________________________  RADIOLOGY   ____________________________________________   PROCEDURES  Procedure(s) performed: No  Procedures   Critical Care performed: No ____________________________________________   INITIAL IMPRESSION / ASSESSMENT AND PLAN / ED COURSE  Pertinent labs & imaging results that were available during my care of the patient were reviewed by me and considered in my medical decision making (see chart for details).  Patient presents with multiple episodes of hematemesis, bright red blood, reports normal stools, no tarry stools, not on blood thinners, history of gastric sleeve recently, question peptic ulcer  Discussed with Dr. Vicente Males of GI, recommends n.p.o.  after midnight for endoscopy tomorrow, discussed with hospitalist for admission.    ____________________________________________   FINAL CLINICAL IMPRESSION(S) / ED DIAGNOSES  Final diagnoses:  Peptic ulceration  Hematemesis with nausea        Note:  This document was prepared using Dragon voice recognition software and may include unintentional dictation errors.   Lavonia Drafts, MD 09/06/20 [phone]

## 2020-09-06 NOTE — H&P (Signed)
History and Physical    Littleton Haub YBO:[id number] DOB: 1960/11/23 DOA: 09/06/2020  PCP: Jodi Marble, MD  Patient coming from: Home  I have personally briefly reviewed patient's old medical records in [place]  Chief Complaint: Hematemesis  HPI: Tristan Bender is a 60 y.o. male with medical history significant for ESRD HD Tues/Thurs/sat, morbid obesity s/p sleeve gastrectomy, recent cholecystectomy, OSA on CPAP, hx of GI bleed with duodenal ulcer (2018), hypertension and iron deficiency anemia who presents with concerns of hematemesis.  For the past 2 days he has had 2 episodes of vomiting frank blood.  The first episode was while he was trying to eat food.  The second episode today he just woke up and vomited bright red blood. He recently underwent laparoscopic cholecystectomy at [facility] in [place] on 4/8 for choledocholithiasis with biliary colic.  Since then his stomach has felt uneasy.  He is he feels like it is always rumbling.  He has not been able to keep any food or liquids down. Still has some RUQ abdomen pain. He was admitted overnight here at [facility] on 4/13 for nausea, vomiting, abdominal pain.  Surgery was consulted and had ordered HIDA scan which was negative for bile leak. He has a sleeve gastrectomy in Dec of 2021 and says he never had issues until his recent cholecystectomy. He denies any NSAIDS use.   ED Course: He was afebrile with normal vitals.  Hemoglobin of 9.6 down from 11 about a week ago.  Creatinine stable at 9.98 from prior 21.  ED physician Dr. Corky Downs discussed case with GI Dr. Vicente Males who recommends he be kept n.p.o. past midnight for endoscopy in the morning.  Pt was given IV Protonix and hospitalist was called for admission.  Review of Systems: Constitutional: No Weight Change, No Fever ENT/Mouth: No sore throat, No Rhinorrhea Eyes: No Eye Pain, No Vision Changes Cardiovascular: No Chest Pain, no SOB Respiratory: No Cough, No Sputum, No  Wheezing, no Dyspnea  Gastrointestinal: + Nausea, + Vomiting, No Diarrhea, No Constipation, + Pain Genitourinary: no Urinary Incontinence, No Urgency, No Flank Pain Musculoskeletal: No Arthralgias, No Myalgias Skin: No Skin Lesions, No Pruritus, Neuro: no Weakness, No Numbness Psych: No Anxiety/Panic, No Depression, + decrease appetite Heme/Lymph: No Bruising, No Bleeding  Past Medical History:  Diagnosis Date  . Adenomatous duodenal polyp    jan 2018[facility]  . Hypertension   . Renal disorder     Past Surgical History:  Procedure Laterality Date  . ABDOMINAL SURGERY    . CHOLECYSTECTOMY    . COLON SURGERY    . DIALYSIS/PERMA CATHETER INSERTION N/A 06/30/2016   Procedure: Dialysis/Perma Catheter Insertion;  Surgeon: Algernon Huxley, MD;  Location: [place] CV LAB;  Service: Cardiovascular;  Laterality: N/A;  . ESOPHAGOGASTRODUODENOSCOPY (EGD) WITH PROPOFOL N/A 07/01/2016   Procedure: ESOPHAGOGASTRODUODENOSCOPY (EGD) WITH PROPOFOL;  Surgeon: Lucilla Lame, MD;  Location: [facility] ENDOSCOPY;  Service: Endoscopy;  Laterality: N/A;  . LAPAROSCOPIC GASTRIC SLEEVE RESECTION       reports that he has never smoked. He has never used smokeless tobacco. He reports that he does not drink alcohol and does not use drugs. Social History  No Known Allergies  Family History  Problem Relation Age of Onset  . Renal Disease Maternal Uncle      Prior to Admission medications   Medication Sig Start Date End Date Taking? Authorizing Provider  calcium acetate (PHOSLO) 667 MG capsule Take 2,668 mg by mouth 3 (three) times daily with  meals. Patient not taking: Reported on 08/26/2020    [provider]  Cholecalciferol (VITAMIN D PO) Take 1 tablet by mouth daily.    [provider]  cinacalcet (SENSIPAR) 60 MG tablet Take 60 mg by mouth daily.    [provider]  ferrous sulfate 325 (65 FE) MG tablet Take 1 tablet (325 mg total) by mouth 2 (two) times daily with a meal. 07/03/16    Vaughan Basta, MD  lidocaine-prilocaine (EMLA) cream SMARTSIG:Sparingly Topical 03/04/20   [provider]  pregabalin (LYRICA) 50 MG capsule Take 50 mg by mouth daily. 02/09/19   [provider]  rOPINIRole (REQUIP) 0.5 MG tablet Take 0.5 mg by mouth 2 (two) times daily. 02/04/20   [provider]  rosuvastatin (CRESTOR) 20 MG tablet Take 20 mg by mouth at bedtime. 12/18/19   [provider]  sevelamer carbonate (RENVELA) 800 MG tablet Take 1,600 mg by mouth 3 (three) times daily as needed. Patient not taking: Reported on 08/26/2020 06/16/16   [provider]    Physical Exam: Vitals:   09/06/20 1656 09/06/20 1657 09/06/20 1830  BP: 107/87  115/76  Pulse: 84    Resp: 16    Temp: 98.8 F (37.1 C)    TempSrc: Oral    SpO2: 99%    Weight:  94.3 kg   Height:  5\' 7"  (1.702 m)     Constitutional: NAD, calm, comfortable, elderly male appearing on stated age lying flat in bed Vitals:   09/06/20 1656 09/06/20 1657 09/06/20 1830  BP: 107/87  115/76  Pulse: 84    Resp: 16    Temp: 98.8 F (37.1 C)    TempSrc: Oral    SpO2: 99%    Weight:  94.3 kg   Height:  5\' 7"  (1.702 m)    Eyes: PERRL, lids and conjunctivae normal ENMT: Mucous membranes are moist.  Neck: normal, supple Respiratory: clear to auscultation bilaterally, no wheezing, no crackles. Normal respiratory effort. No accessory muscle use.  Cardiovascular: Regular rate and rhythm, no murmurs / rubs / gallops. No extremity edema. Abdomen: no tenderness, no masses palpated. Bowel sounds positive.  Musculoskeletal: no clubbing / cyanosis. No joint deformity upper and lower extremities. Good ROM, no contractures. Normal muscle tone.  Skin: no rashes, lesions, ulcers. No induration Neurologic: CN 2-12 grossly intact. Sensation intact, Strength 5/5 in all 4.  Psychiatric: Normal judgment and insight. Alert and oriented x 3. Normal mood.     Labs on Admission: I have personally  reviewed following labs and imaging studies  CBC: Recent Labs  Lab 09/06/20 1659  WBC 6.0  HGB 9.6*  HCT 30.1*  MCV 87.2  PLT 315   Basic Metabolic Panel: Recent Labs  Lab 09/06/20 1659  NA 139  K 5.0  CL 95*  CO2 31  GLUCOSE 83  BUN 40*  CREATININE 9.98*  CALCIUM 8.9   GFR: Estimated Creatinine Clearance: 8.6 mL/min (A) (by C-G formula based on SCr of 9.98 mg/dL (H)). Liver Function Tests: Recent Labs  Lab 09/06/20 1659  AST 17  ALT 7  ALKPHOS 38  BILITOT 0.7  PROT 7.2  ALBUMIN 3.8   Recent Labs  Lab 09/06/20 1659  LIPASE 34   No results for input(s): AMMONIA in the last 168 hours. Coagulation Profile: Recent Labs  Lab 09/06/20 1806  INR 1.1   Cardiac Enzymes: No results for input(s): CKTOTAL, CKMB, CKMBINDEX, TROPONINI in the last 168 hours. BNP (last 3 results) No results  for input(s): PROBNP in the last 8760 hours. HbA1C: No results for input(s): HGBA1C in the last 72 hours. CBG: No results for input(s): GLUCAP in the last 168 hours. Lipid Profile: No results for input(s): CHOL, HDL, LDLCALC, TRIG, CHOLHDL, LDLDIRECT in the last 72 hours. Thyroid Function Tests: No results for input(s): TSH, T4TOTAL, FREET4, T3FREE, THYROIDAB in the last 72 hours. Anemia Panel: No results for input(s): VITAMINB12, FOLATE, FERRITIN, TIBC, IRON, RETICCTPCT in the last 72 hours. Urine analysis: No results found for: COLORURINE, APPEARANCEUR, LABSPEC, PHURINE, GLUCOSEU, HGBUR, BILIRUBINUR, KETONESUR, PROTEINUR, UROBILINOGEN, NITRITE, LEUKOCYTESUR  Radiological Exams on Admission: No results found.    Assessment/Plan  Hememetesis/Upper GI bleed w/hx of sleeve gastrectomy likely peptic or duodenal ulcer which he has hx of  Hgb of 9.6 down from 11.  Transfusion threshold less than 7. Trial of Carafate Continue daily IV PPI Keep n.p.o. past midnight for endoscopy in the morning with GI  ESRD on HD Tues/Thurs/Sat  Has not missed any this week creatinine  stable Continue cinacalcet  OSA continue CPAP  HTN  Controlled  RLS Continue Lyrica and Requip  DVT prophylaxis:.SCDs Code Status: Full Family Communication: Plan discussed with patient at bedside  disposition Plan: Home with observation Consults called: GI Admission status: Observation  Level of care: Progressive Cardiac  Status is: Observation  The patient remains OBS appropriate and will d/c before 2 midnights.  Dispo: The patient is from: Home              Anticipated d/c is to: Home              Patient currently is not medically stable to d/c.   Difficult to place patient No         Orene Desanctis DO [facility]   If 7PM-7AM, please contact night-coverage [email or web address]   09/06/2020, 7:47 PM

## 2020-09-07 ENCOUNTER — Encounter: Payer: Self-pay | Admitting: Family Medicine

## 2020-09-07 ENCOUNTER — Observation Stay: Payer: Medicare Other | Admitting: Certified Registered Nurse Anesthetist

## 2020-09-07 ENCOUNTER — Encounter
Admission: EM | Disposition: A | Discharge status: Home / Self Care | Payer: Self-pay | Source: Home / Self Care | Attending: Emergency Medicine

## 2020-09-07 ENCOUNTER — Ambulatory Visit: Payer: Medicare Other | Admitting: Internal Medicine

## 2020-09-07 DIAGNOSIS — K922 Gastrointestinal hemorrhage, unspecified: Secondary | ICD-10-CM | POA: Diagnosis not present

## 2020-09-07 DIAGNOSIS — K920 Hematemesis: Secondary | ICD-10-CM

## 2020-09-07 DIAGNOSIS — D132 Benign neoplasm of duodenum: Secondary | ICD-10-CM | POA: Diagnosis not present

## 2020-09-07 DIAGNOSIS — Z903 Acquired absence of stomach [part of]: Secondary | ICD-10-CM | POA: Diagnosis not present

## 2020-09-07 DIAGNOSIS — N186 End stage renal disease: Secondary | ICD-10-CM | POA: Diagnosis not present

## 2020-09-07 DIAGNOSIS — G4733 Obstructive sleep apnea (adult) (pediatric): Secondary | ICD-10-CM | POA: Diagnosis not present

## 2020-09-07 HISTORY — PX: ESOPHAGOGASTRODUODENOSCOPY (EGD) WITH PROPOFOL: SHX5813

## 2020-09-07 LAB — COMPREHENSIVE METABOLIC PANEL
ALT: 6 U/L (ref 0–44)
AST: 12 U/L — ABNORMAL LOW (ref 15–41)
Albumin: 3.1 g/dL — ABNORMAL LOW (ref 3.5–5.0)
Alkaline Phosphatase: 32 U/L — ABNORMAL LOW (ref 38–126)
Anion gap: 11 (ref 5–15)
BUN: 45 mg/dL — ABNORMAL HIGH (ref 6–20)
CO2: 30 mmol/L (ref 22–32)
Calcium: 8.5 mg/dL — ABNORMAL LOW (ref 8.9–10.3)
Chloride: 97 mmol/L — ABNORMAL LOW (ref 98–111)
Creatinine, Ser: 11.42 mg/dL — ABNORMAL HIGH (ref 0.61–1.24)
GFR, Estimated: 5 mL/min — ABNORMAL LOW (ref >60)
Glucose, Bld: 76 mg/dL (ref 70–99)
Potassium: 4.8 mmol/L (ref 3.5–5.1)
Sodium: 138 mmol/L (ref 135–145)
Total Bilirubin: 0.7 mg/dL (ref 0.3–1.2)
Total Protein: 6.2 g/dL — ABNORMAL LOW (ref 6.5–8.1)

## 2020-09-07 LAB — SARS CORONAVIRUS 2 (TAT 6-24 HRS): SARS Coronavirus 2: NEGATIVE

## 2020-09-07 LAB — IRON AND TIBC
Iron: 47 ug/dL (ref 45–182)
Saturation Ratios: 29 % (ref 17.9–39.5)
TIBC: 164 ug/dL — ABNORMAL LOW (ref 250–450)
UIBC: 117 ug/dL

## 2020-09-07 LAB — FOLATE: Folate: 4.6 ng/mL — ABNORMAL LOW (ref >5.9)

## 2020-09-07 LAB — FERRITIN: Ferritin: 596 ng/mL — ABNORMAL HIGH (ref 24–336)

## 2020-09-07 LAB — VITAMIN B12: Vitamin B-12: 576 pg/mL (ref 180–914)

## 2020-09-07 SURGERY — ESOPHAGOGASTRODUODENOSCOPY (EGD) WITH PROPOFOL
Anesthesia: General

## 2020-09-07 MED ORDER — GLYCOPYRROLATE 0.2 MG/ML IJ SOLN
INTRAMUSCULAR | Status: DC | PRN
  Administered 2020-09-07: .2 mg via INTRAVENOUS

## 2020-09-07 MED ORDER — MIDAZOLAM HCL 2 MG/2ML IJ SOLN
INTRAMUSCULAR | Status: AC
  Filled 2020-09-07: qty 2

## 2020-09-07 MED ORDER — PROPOFOL 500 MG/50ML IV EMUL
INTRAVENOUS | Status: DC | PRN
  Administered 2020-09-07: 150 ug/kg/min via INTRAVENOUS

## 2020-09-07 MED ORDER — SEVOFLURANE IN SOLN
RESPIRATORY_TRACT | Status: AC
  Filled 2020-09-07: qty 250

## 2020-09-07 MED ORDER — CINACALCET HCL 30 MG PO TABS
60.0000 mg | ORAL_TABLET | Freq: Every day | ORAL | Status: DC
  Filled 2020-09-07: qty 2

## 2020-09-07 MED ORDER — LIDOCAINE HCL (CARDIAC) PF 100 MG/5ML IV SOSY
PREFILLED_SYRINGE | INTRAVENOUS | Status: DC | PRN
  Administered 2020-09-07: 100 mg via INTRAVENOUS

## 2020-09-07 MED ORDER — SODIUM CHLORIDE 0.9 % IV SOLN: INTRAVENOUS | Status: DC

## 2020-09-07 MED ORDER — PANTOPRAZOLE SODIUM 40 MG PO TBEC: 1.0000 | DELAYED_RELEASE_TABLET | Freq: Two times a day (BID) | ORAL | 1 refills | Status: DC

## 2020-09-07 MED ORDER — PROPOFOL 10 MG/ML IV BOLUS
INTRAVENOUS | Status: DC | PRN
  Administered 2020-09-07: 30 mg via INTRAVENOUS
  Administered 2020-09-07: 70 mg via INTRAVENOUS

## 2020-09-07 MED ORDER — FENTANYL CITRATE (PF) 100 MCG/2ML IJ SOLN
INTRAMUSCULAR | Status: AC
  Filled 2020-09-07: qty 2

## 2020-09-07 MED ORDER — PROPOFOL 10 MG/ML IV BOLUS
INTRAVENOUS | Status: AC
  Filled 2020-09-07: qty 20

## 2020-09-07 NOTE — Progress Notes (Signed)
CPAP is contraindicated due to pt vomiting. CPAP not in room. Please notify Respiratory Department when pt is not vomiting.

## 2020-09-07 NOTE — ED Notes (Signed)
This RN contacted pharmacy about meds needing to be verified.

## 2020-09-07 NOTE — ED Notes (Signed)
Pt out of room to nurse's desk stating he is going to step outside, pt advised he cannot go outside and come back in as he is admitted.  Pt stated his room is freezing, he has been freezing all night, this is ridiculous and he needs to be moved to a warmer room. Pt given warm blanket and advised that we are waiting on room upstairs but he cannot be moved to another room at this moment.  Pt refusing to be hooked up to monitors for vitals at this time.

## 2020-09-07 NOTE — ED Notes (Signed)
Informed RN bed assigned [phone]

## 2020-09-07 NOTE — Op Note (Signed)
[facility] Patient Name: Tristan Bender Procedure Date: 09/07/2020 11:44 AM MRN: [id number] Account #: [id number] Date of Birth: 1961/01/31 Admit Type: Inpatient Age: 60 Room: [facility] ENDO ROOM 1 Gender: Male Note Status: Finalized Procedure:             Upper GI endoscopy Indications:           Hematemesis Providers:             Lin Landsman MD, MD Referring MD:          No Local Md, MD (Referring MD) Complications:         No immediate complications. Estimated blood loss:                         Minimal. Procedure:             Pre-Anesthesia Assessment:                        - Prior to the procedure, a History and Physical was                         performed, and patient medications and allergies were                         reviewed. The patient is competent. The risks and                         benefits of the procedure and the sedation options and                         risks were discussed with the patient. All questions                         were answered and informed consent was obtained.                         Patient identification and proposed procedure were                         verified by the physician, the nurse, the                         anesthesiologist, the anesthetist and the technician                         in the pre-procedure area in the procedure room in the                         endoscopy suite. Mental Status Examination: alert and                         oriented. Airway Examination: normal oropharyngeal                         airway and neck mobility. Respiratory Examination:                         clear to auscultation. CV Examination: normal.  Prophylactic Antibiotics: The patient does not require                         prophylactic antibiotics. Prior Anticoagulants: The                         patient has taken no previous anticoagulant or                          antiplatelet agents. ASA Grade Assessment: III - A                         patient with severe systemic disease. After reviewing                         the risks and benefits, the patient was deemed in                         satisfactory condition to undergo the procedure. The                         anesthesia plan was to use general anesthesia.                         Immediately prior to administration of medications,                         the patient was re-assessed for adequacy to receive                         sedatives. The heart rate, respiratory rate, oxygen                         saturations, blood pressure, adequacy of pulmonary                         ventilation, and response to care were monitored                         throughout the procedure. The physical status of the                         patient was re-assessed after the procedure.                        After obtaining informed consent, the endoscope was                         passed under direct vision. Throughout the procedure,                         the patient's blood pressure, pulse, and oxygen                         saturations were monitored continuously. The Endoscope                         was introduced through the mouth, and advanced to the  second part of duodenum. The upper GI endoscopy was                         accomplished without difficulty. The patient tolerated                         the procedure fairly well. Findings:      A single large sessile polyp with no bleeding was found in the second       portion of the duodenum. Patient has known h/o duodenal adenoma, s/p EMR       in 2018. Biopsies were taken with a cold forceps for histology.      Few small polyps were also identified in d2      Sleeve gastrectomy, Diffuse mildly erythematous mucosa without bleeding       was found in the entire examined stomach. Biopsies were taken with a       cold forceps for  Helicobacter pylori testing.      LA Grade D (one or more mucosal breaks involving at least 75% of       esophageal circumference) esophagitis with stigmataof recent bleeding       was found at the gastroesophageal junction.      Circumferential salmon-colored mucosa was present from 30 to 35 cm. No       other visible abnormalities were present. The maximum longitudinal       extent of these esophageal mucosal changes was 5 cm in length. Biopsies       were taken with a cold forceps for histology.      Esophagogastric landmarks were identified: the gastroesophageal junction       was found at 35 cm from the incisors.      A small hiatal hernia was present. Impression:            - A single duodenal polyp. Biopsied.                        - Erythematous mucosa in the stomach. Biopsied.                        - LA Grade D reflux esophagitis with stigmata of                         recent bleeding, source of hematemesis.                        - Salmon-colored mucosa suspicious for long-segment                         Barrett's esophagus. Biopsied.                        - Esophagogastric landmarks identified. Recommendation:        - Await pathology results.                        - Return patient to hospital ward for ongoing care.                        - Resume previous diet today.                        -  Continue present medications.                        - Repeat upper endoscopy in 3 months to check healing                         and for surveillance of Barrett's esophagus.                        - Follow an antireflux regimen indefinitely.                        - Use Prilosec (omeprazole) 40 mg PO BID long term. Procedure Code(s):     --- Professional ---                        [phone], Esophagogastroduodenoscopy, flexible,                         transoral; with biopsy, single or multiple Diagnosis Code(s):     --- Professional ---                        K31.7, Polyp of stomach  and duodenum                        K31.89, Other diseases of stomach and duodenum                        K22.8, Other specified diseases of esophagus                        K21.00, Gastro-esophageal reflux disease with                         esophagitis, without bleeding                        K92.0, Hematemesis CPT copyright 2019 American Medical Association. All rights reserved. The codes documented in this report are preliminary and upon coder review may  be revised to meet current compliance requirements. Dr. Ulyess Mort Lin Landsman MD, MD 09/07/2020 12:11:22 PM This report has been signed electronically. Number of Addenda: 0 Note Initiated On: 09/07/2020 11:44 AM Estimated Blood Loss:  Estimated blood loss was minimal.      [facility]

## 2020-09-07 NOTE — Progress Notes (Signed)
EGD post procedure note:  S/p sleeve gastrectomy Large duodenal polyp, known history of adenoma, biopsies performed LA grade D erosive esophagitis with stigmata of recent bleeding at the GE junction Long segment Barrett's esophagus  Recommendations Recommend omeprazole 40 mg p.o. twice daily before meals, long-term Repeat EGD in 3 months to confirm healing Patient will need referral to advanced endoscopist for EMR of the duodenal adenoma, I will take care of this as outpatient Close follow-up with GI in 1 month, patient would like to follow-up with me Advance diet as tolerated Patient can go home today  Cephas Darby, MD [address]  [place]  [place], [place] [address]  Main: [phone]  Fax: [phone] Pager: [phone]

## 2020-09-07 NOTE — Transfer of Care (Signed)
Immediate Anesthesia Transfer of Care Note  Patient: Tristan Bender  Procedure(s) Performed: ESOPHAGOGASTRODUODENOSCOPY (EGD) WITH PROPOFOL (N/A )  Patient Location: Endoscopy Unit  Anesthesia Type:General  Level of Consciousness: awake, alert  and oriented  Airway & Oxygen Therapy: Patient Spontanous Breathing  Post-op Assessment: Report given to RN and Post -op Vital signs reviewed and stable  Post vital signs: Reviewed and stable  Last Vitals:  Vitals Value Taken Time  BP 106/42 09/07/20 1212  Temp 36.1 C 09/07/20 1212  Pulse 116 09/07/20 1214  Resp 21 09/07/20 1214  SpO2 97 % 09/07/20 1214  Vitals shown include unvalidated device data.  Last Pain:  Vitals:   09/07/20 1212  TempSrc: Temporal  PainSc: Asleep         Complications: No complications documented.

## 2020-09-07 NOTE — ED Notes (Signed)
This RN confirmed with Cardell Peach RN that ok to receive pt at this time

## 2020-09-07 NOTE — Anesthesia Postprocedure Evaluation (Signed)
Anesthesia Post Note  Patient: Jayleon Mcfarlane  Procedure(s) Performed: ESOPHAGOGASTRODUODENOSCOPY (EGD) WITH PROPOFOL (N/A )  Patient location during evaluation: Endoscopy Anesthesia Type: General Level of consciousness: awake and alert and oriented Pain management: pain level controlled Vital Signs Assessment: post-procedure vital signs reviewed and stable Respiratory status: spontaneous breathing Cardiovascular status: blood pressure returned to baseline Anesthetic complications: no   No complications documented.   Last Vitals:  Vitals:   09/07/20 1232 09/07/20 1314  BP: (!) 110/56 105/66  Pulse: (!) 102 93  Resp: (!) 0 18  Temp:  36.4 C  SpO2: 96% 100%    Last Pain:  Vitals:   09/07/20 1232  TempSrc:   PainSc: 0-No pain                 Janelle Culton

## 2020-09-07 NOTE — Progress Notes (Signed)
Pt canceled his appointment. Office staff will reach out to reschedule.  

## 2020-09-07 NOTE — Anesthesia Preprocedure Evaluation (Signed)
Anesthesia Evaluation  Patient identified by MRN, date of birth, ID band Patient awake    Reviewed: Allergy & Precautions, NPO status , Patient's Chart, lab work & pertinent test results  History of Anesthesia Complications Negative for: history of anesthetic complications  Airway Mallampati: III  TM Distance: >3 FB Neck ROM: Full    Dental no notable dental hx.    Pulmonary sleep apnea , neg COPD,    breath sounds clear to auscultation- rhonchi (-) wheezing      Cardiovascular hypertension, Pt. on medications (-) CAD and (-) Past MI  Rhythm:Regular Rate:Normal - Systolic murmurs and - Diastolic murmurs    Neuro/Psych negative neurological ROS  negative psych ROS   GI/Hepatic Neg liver ROS, PUD, GIB   Endo/Other  negative endocrine ROSneg diabetes  Renal/GU ESRF and DialysisRenal disease (had dialysis today)     Musculoskeletal negative musculoskeletal ROS (+)   Abdominal (+) + obese,   Peds  Hematology  (+) anemia ,   Anesthesia Other Findings Past Medical History: No date: Adenomatous duodenal polyp     Comment: jan 2018- [facility] No date: Hypertension No date: Renal disorder   Reproductive/Obstetrics                             Anesthesia Physical  Anesthesia Plan  ASA: III  Anesthesia Plan: General   Post-op Pain Management:    Induction: Intravenous  PONV Risk Score and Plan: Propofol infusion  Airway Management Planned: Natural Airway and Nasal Cannula  Additional Equipment:   Intra-op Plan:   Post-operative Plan:   Informed Consent: I have reviewed the patients History and Physical, chart, labs and discussed the procedure including the risks, benefits and alternatives for the proposed anesthesia with the patient or authorized representative who has indicated his/her understanding and acceptance.     Dental advisory given  Plan Discussed with: CRNA and  Anesthesiologist  Anesthesia Plan Comments:         Anesthesia Quick Evaluation

## 2020-09-07 NOTE — Progress Notes (Signed)
Discharge instructions explained/pt verbalized understanding. IV and tele removed. Will transport off unit via wheelchair.  

## 2020-09-07 NOTE — Consult Note (Signed)
Tristan Darby, MD [address]  [place]  [place], [place] [address]  Main: [phone]  Fax: [phone] Pager: [phone]   Consultation  Referring Provider:     No ref. provider found Primary Care Physician:  Jodi Marble, MD Primary Gastroenterologist: Althia Forts over        Reason for Consultation:     Hematemesis  Date of Admission:  09/06/2020 Date of Consultation:  09/07/2020         HPI:   Tristan Bender is a 60 y.o. 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  Patient presented yesterday with nausea and 2 episodes of hematemesis.  Patient underwent laparoscopic cholecystectomy secondary to biliary colic.  Patient is admitted with nausea, vomiting. He also underwent HIDA scan which was negative for bile leak.  Patient was hemodynamically stable.  His hemoglobin dropped from 11 on 414 22-9.6 on 09/06/2020.  Normal MCV.  No evidence of chronic liver disease.  PT/INR normal  NSAIDs: None  Antiplts/Anticoagulants/Anti thrombotics: None  GI Procedures: Endoscopy in 2018, large duodenal mass, pathology showed duodenal adenoma Repeat endoscopy at [facility], EMR of the duodenal polyp  Past Medical History:  Diagnosis Date  . Adenomatous duodenal polyp    jan [facility]  . Hypertension   . Renal disorder     Past Surgical History:  Procedure Laterality Date  . ABDOMINAL SURGERY    . CHOLECYSTECTOMY    . COLON SURGERY    . DIALYSIS/PERMA CATHETER INSERTION N/A 06/30/2016   Procedure: Dialysis/Perma Catheter Insertion;  Surgeon: Algernon Huxley, MD;  Location: [place] [facility];  Service: Cardiovascular;  Laterality: N/A;  . ESOPHAGOGASTRODUODENOSCOPY (EGD) WITH PROPOFOL N/A 07/01/2016   Procedure: ESOPHAGOGASTRODUODENOSCOPY (EGD) WITH PROPOFOL;  Surgeon: Lucilla Lame, MD;  Location: [facility] ENDOSCOPY;  Service: Endoscopy;  Laterality: N/A;  . LAPAROSCOPIC GASTRIC SLEEVE RESECTION      Prior to Admission medications   Medication Sig Start Date End Date Taking? Authorizing Provider  Cholecalciferol (VITAMIN D PO) Take 1.25 mcg by mouth daily. Takes before dialysis T-Th-S   Yes [provider]  ferrous sulfate 325 (65 FE) MG tablet Take 1 tablet (325 mg total) by mouth 2 (two) times daily with a meal. 07/03/16  Yes Vaughan Basta, MD  lidocaine-prilocaine (EMLA) cream SMARTSIG:Sparingly Topical 03/04/20  Yes [provider]  pantoprazole (PROTONIX) 40 MG tablet Take 1 tablet by mouth daily. 09/03/20  Yes [provider]  pregabalin (LYRICA) 50 MG capsule Take 50 mg by mouth daily. 02/09/19  Yes [provider]  prochlorperazine (COMPAZINE) 10 MG tablet Take 10 mg by mouth every 6 (six) hours as needed. 08/31/20  Yes [provider]  rOPINIRole (REQUIP) 0.5 MG tablet Take 0.5 mg by mouth 2 (two) times daily. 02/04/20  Yes [provider]  sucralfate (CARAFATE) 1 g tablet Take 1 g by mouth 4 (four) times daily. 09/03/20  Yes [provider]  traMADol (ULTRAM) 50 MG tablet Take 50 mg by mouth every 6 (six) hours as needed. 09/03/20  Yes [provider]  calcium acetate (PHOSLO) 667 MG capsule Take 2,668 mg by mouth 3 (three) times daily with meals. Patient not taking: No sig reported    [provider]  cinacalcet (SENSIPAR) 60 MG tablet Take 60 mg by mouth daily. Patient not  taking: Reported on 09/06/2020    [provider]  rosuvastatin (CRESTOR) 20 MG tablet Take 20 mg by mouth at bedtime. Patient not taking: Reported on 09/06/2020 12/18/19   [provider]  sevelamer carbonate (RENVELA) 800 MG tablet Take 1,600 mg by mouth 3 (three) times daily as needed. Patient not taking: No sig reported 06/16/16   [provider]    Current Facility-Administered  Medications:  .  0.9 %  sodium chloride infusion, , Intravenous, Continuous, Maclane Holloran, Tally Due, MD, Last Rate: 20 mL/hr at 09/07/20 1135, New Bag at 09/07/20 1135 .  [MAR Hold] cinacalcet (SENSIPAR) tablet 60 mg, 60 mg, Oral, Q breakfast, Fritzi Mandes, MD .  Doug Sou Hold] ferrous sulfate tablet 325 mg, 325 mg, Oral, BID WC, Tu, Ching T, DO .  [MAR Hold] ondansetron (ZOFRAN) injection 4 mg, 4 mg, Intravenous, Q6H PRN, Tu, Ching T, DO, 4 mg at 09/07/20 0724 .  [MAR Hold] pantoprazole (PROTONIX) injection 40 mg, 40 mg, Intravenous, Q24H, Tu, Ching T, DO, 40 mg at 09/07/20 0930 .  [MAR Hold] pregabalin (LYRICA) capsule 50 mg, 50 mg, Oral, Daily, Tu, Ching T, DO, 50 mg at 09/07/20 0928 .  [MAR Hold] rOPINIRole (REQUIP) tablet 0.5 mg, 0.5 mg, Oral, BID, Tu, Ching T, DO, 0.5 mg at 09/07/20 0930 .  [MAR Hold] rosuvastatin (CRESTOR) tablet 10 mg, 10 mg, Oral, QHS, Tu, Ching T, DO .  [MAR Hold] sucralfate (CARAFATE) 1 GM/10ML suspension 1 g, 1 g, Oral, Once, Tu, Ching T, DO  Family History  Problem Relation Age of Onset  . Renal Disease Maternal Uncle      Social History   Tobacco Use  . Smoking status: Never Smoker  . Smokeless tobacco: Never Used  Substance Use Topics  . Alcohol use: No  . Drug use: No    Allergies as of 09/06/2020  . (No Known Allergies)    Review of Systems:    All systems reviewed and negative except where noted in HPI.   Physical Exam:  Vital signs in last 24 hours: Temp:  [98.4 F (36.9 C)-98.8 F (37.1 C)] 98.4 F (36.9 C) (04/25 0030) Pulse Rate:  [63-87] 69 (04/25 1045) Resp:  [16-22] 22 (04/25 0900) BP: (99-115)/(67-87) 109/68 (04/25 1045) SpO2:  [93 %-100 %] 98 % (04/25 1045) Weight:  [94.3 kg] 94.3 kg (04/24 1657) Last BM Date: 09/06/20 General:   Pleasant, cooperative in NAD Head:  Normocephalic and atraumatic. Eyes:   No icterus.   Conjunctiva pink. PERRLA. Ears:  Normal auditory acuity. Neck:  Supple; no masses or thyroidomegaly Lungs:  Respirations even and unlabored. Lungs clear to auscultation bilaterally.   No wheezes, crackles, or rhonchi.  Heart:  Regular rate and rhythm;  Without murmur, clicks, rubs or gallops Abdomen:  Soft, nondistended, nontender. Normal bowel sounds. No appreciable masses or hepatomegaly.  No rebound or guarding.  Rectal:  Not performed. Msk:  Symmetrical without gross deformities.  Strength normal Extremities:  Without edema, cyanosis or clubbing. Neurologic:  Alert and oriented x3;  grossly normal neurologically. Skin:  Intact without significant lesions or rashes. Psych:  Alert and cooperative. Normal affect.  LAB RESULTS: CBC Latest Ref Rng & Units 09/06/2020 08/27/2020 08/26/2020  WBC 4.0 - 10.5 K/uL 6.0 6.7 6.3  Hemoglobin 13.0 - 17.0 g/dL 9.6(L) 11.1(L) 11.0(L)  Hematocrit 39.0 - 52.0 % 30.1(L) 33.3(L) 33.9(L)  Platelets 150 - 400 K/uL 229 216 230    BMET BMP Latest Ref Rng & Units 09/07/2020 09/06/2020 08/27/2020  Glucose 70 - 99 mg/dL 76 83 78  BUN 6 - 20 mg/dL 45(H) 40(H) 84(H)  Creatinine 0.61 - 1.24 mg/dL 11.42(H) 9.98(H) 21.67(H)  Sodium 135 - 145 mmol/L 138 139 140  Potassium 3.5 - 5.1 mmol/L 4.8 5.0 5.8(H)  Chloride 98 - 111 mmol/L 97(L) 95(L) 100  CO2 22 - 32 mmol/L '30 31 24  ' Calcium 8.9 - 10.3 mg/dL 8.5(L) 8.9 8.7(L)    LFT Hepatic Function Latest Ref Rng & Units 09/07/2020 09/06/2020 08/27/2020  Total Protein 6.5 - 8.1 g/dL 6.2(L) 7.2 6.5  Albumin 3.5 - 5.0 g/dL 3.1(L) 3.8 3.4(L)  AST 15 - 41 U/L 12(L) 17 8(L)  ALT 0 - 44 U/L 6 7 <5  Alk Phosphatase 38 - 126 U/L 32(L) 38 44  Total Bilirubin 0.3 - 1.2 mg/dL 0.7 0.7 0.6     STUDIES: No results found.    Impression / Plan:   Tristan Bender is a 60 y.o. male with history of ESRD on hemodialysis, history of duodenal adenoma status post EMR in 2018, history of sleeve gastrectomy in 2021, s/p laparoscopic cholecystectomy in 08/2020 secondary to biliary colic.  Patient is admitted with nausea, 2 episodes of hematemesis with  drop in hemoglobin.  Patient is hemodynamically stable  Hematemesis with nausea Increase Protonix to 40 mg IV twice daily Monitor CBC closely and maintain hemoglobin above 7 Check iron panel Patient is n.p.o., recommend upper endoscopy today  I have discussed alternative options, risks & benefits,  which include, but are not limited to, bleeding, infection, perforation,respiratory complication & drug reaction.  The patient agrees with this plan & written consent will be obtained.     Thank you for involving me in the care of this patient.      LOS: 0 days   Sherri Sear, MD  09/07/2020, 11:43 AM   Note: This dictation was prepared with Dragon dictation along with smaller phrase technology. Any transcriptional errors that result from this process are unintentional.

## 2020-09-08 LAB — SURGICAL PATHOLOGY

## 2020-09-09 ENCOUNTER — Encounter: Payer: Self-pay | Admitting: Gastroenterology

## 2020-09-10 ENCOUNTER — Telehealth: Payer: Self-pay

## 2020-09-10 NOTE — Telephone Encounter (Signed)
Placed referral to [facility]  

## 2020-09-10 NOTE — Telephone Encounter (Signed)
Called and left a message for call back  

## 2020-09-10 NOTE — Telephone Encounter (Signed)
-----   Message from Lin Landsman, MD sent at 09/09/2020  5:04 PM EDT ----- Please inform patient that polyp in the duodenum that was removed at [facility] back in 2018, has grown and the pathology results came back as adenoma.  This means he needs to undergo the same procedure.  Please refer him to [facility] advanced endoscopy team for EMR of large duodenal adenoma if patient is agreeable.   Thanks RV

## 2020-09-11 NOTE — Telephone Encounter (Signed)
Patient called back. I notified him that a referral was sent to [facility] on his behalf.

## 2020-09-16 NOTE — Discharge Summary (Signed)
[place] [facility] [place] NAME: Tristan Bender    MR#:  [id number]  DATE OF BIRTH:  03-18-1961  DATE OF ADMISSION:  09/06/2020 ADMITTING PHYSICIAN: Orene Desanctis, DO  DATE OF DISCHARGE: 09/07/2020  PRIMARY CARE PHYSICIAN: Jodi Marble, MD    ADMISSION DIAGNOSIS:  Peptic ulceration [K27.9] Hemoptysis [R04.2] Hematemesis with nausea [K92.0]  DISCHARGE DIAGNOSIS:  G.I. bleed secondary to esophagitis  SECONDARY DIAGNOSIS:   Past Medical History:  Diagnosis Date  . Adenomatous duodenal polyp    jan 2018[facility]  . Hypertension   . Renal disorder     HOSPITAL COURSE:   Tristan Bender is a 60 y.o. male with medical history significant for ESRD HD Tues/Thurs/sat, morbid obesity s/p sleeve gastrectomy, recent cholecystectomy, OSA on CPAP, hx of GI bleed with duodenal ulcer (2018), hypertension and iron deficiency anemia who presents with concerns of hematemesis.  Hememetesis/Upper GI bleed w/hx of sleeve gastrectomy likely peptic or duodenal ulcer which he has hx of  Hgb of 9.6 down from 11.  Transfusion threshold less than 7. Trial of Carafate Continue daily IV PPI patient was seen by G.I. Dr. Marius Ditch. He is status post EGDA single duodenal polyp. Biopsied.                        - Erythematous mucosa in the stomach. Biopsied.                        - LA Grade D reflux esophagitis with stigmata of                         recent bleeding, source of hematemesis.                        - Salmon-colored mucosa suspicious for long-segment                         Barrett's esophagus. Biopsied.                        - Esophagogastric landmarks identified hemodynamically remains stable. Okay from G.I. standpoint to go home.  ESRD on HD Tues/Thurs/Sat  Has not missed any this week creatinine stable Continue cinacalcet  OSA continue CPAP  HTN  Controlled  RLS Continue Lyrica and Requip  DVT prophylaxis:.SCDs Code Status:  Full Family Communication: Plan discussed with patient at bedside  disposition Plan: Home with observation Consults called: GI Admission status: Observation  patient did not have further episodes of hematemesis. His hemoglobin remains stable at 9.6 discharged to home with outpatient follow-up with PCP and G.I.  CONSULTS OBTAINED:  Treatment Team:  Lin Landsman, MD  DRUG ALLERGIES:  No Known Allergies  DISCHARGE MEDICATIONS:   Allergies as of 09/07/2020   No Known Allergies     Medication List    STOP taking these medications   calcium acetate 667 MG capsule Commonly known as: PHOSLO   rosuvastatin 20 MG tablet Commonly known as: CRESTOR   sevelamer carbonate 800 MG tablet Commonly known as: RENVELA     TAKE these medications   cinacalcet 60 MG tablet Commonly known as: SENSIPAR Take 60 mg by mouth daily.   ferrous sulfate 325 (65 FE) MG tablet Take 1 tablet (325 mg total) by mouth 2 (two) times daily with a  meal.   lidocaine-prilocaine cream Commonly known as: EMLA SMARTSIG:Sparingly Topical   pantoprazole 40 MG tablet Commonly known as: PROTONIX Take 1 tablet (40 mg total) by mouth 2 (two) times daily. What changed: when to take this   pregabalin 50 MG capsule Commonly known as: LYRICA Take 50 mg by mouth daily.   prochlorperazine 10 MG tablet Commonly known as: COMPAZINE Take 10 mg by mouth every 6 (six) hours as needed.   rOPINIRole 0.5 MG tablet Commonly known as: REQUIP Take 0.5 mg by mouth 2 (two) times daily.   sucralfate 1 g tablet Commonly known as: CARAFATE Take 1 g by mouth 4 (four) times daily.   traMADol 50 MG tablet Commonly known as: ULTRAM Take 50 mg by mouth every 6 (six) hours as needed.   VITAMIN D PO Take 1.25 mcg by mouth daily. Takes before dialysis T-Th-S       If you experience worsening of your admission symptoms, develop shortness of breath, life threatening emergency, suicidal or homicidal thoughts you must  seek medical attention immediately by calling 911 or calling your MD immediately  if symptoms less severe.  You Must read complete instructions/literature along with all the possible adverse reactions/side effects for all the Medicines you take and that have been prescribed to you. Take any new Medicines after you have completely understood and accept all the possible adverse reactions/side effects.   Please note  You were cared for by a hospitalist during your hospital stay. If you have any questions about your discharge medications or the care you received while you were in the hospital after you are discharged, you can call the unit and asked to speak with the hospitalist on call if the hospitalist that took care of you is not available. Once you are discharged, your primary care physician will handle any further medical issues. Please note that NO REFILLS for any discharge medications will be authorized once you are discharged, as it is imperative that you return to your primary care physician (or establish a relationship with a primary care physician if you do not have one) for your aftercare needs so that they can reassess your need for medications and monitor your lab values.   DATA REVIEW:   CBC  No results for input(s): WBC, HGB, HCT, PLT in the last 168 hours.  Chemistries  No results for input(s): NA, K, CL, CO2, GLUCOSE, BUN, CREATININE, CALCIUM, MG, AST, ALT, ALKPHOS, BILITOT in the last 168 hours.  Invalid input(s): [place]  Microbiology Results   Recent Results (from the past 240 hour(s))  SARS CORONAVIRUS 2 (TAT 6-24 HRS) Nasopharyngeal Nasopharyngeal Swab     Status: None   Collection Time: 09/06/20  7:03 PM   Specimen: Nasopharyngeal Swab  Result Value Ref Range Status   SARS Coronavirus 2 NEGATIVE NEGATIVE Final    Comment: (NOTE) SARS-CoV-2 target nucleic acids are NOT DETECTED.  The SARS-CoV-2 RNA is generally detectable in upper and lower respiratory specimens  during the acute phase of infection. Negative results do not preclude SARS-CoV-2 infection, do not rule out co-infections with other pathogens, and should not be used as the sole basis for treatment or other patient management decisions. Negative results must be combined with clinical observations, patient history, and epidemiological information. The expected result is Negative.  Fact Sheet for Patients: SugarRoll.be  Fact Sheet for Healthcare Providers: https://www.woods-mathews.com/  This test is not yet approved or cleared by the [place] FDA and  has been authorized for detection and/or  diagnosis of SARS-CoV-2 by FDA under an Emergency Use Authorization (EUA). This EUA will remain  in effect (meaning this test can be used) for the duration of the COVID-19 declaration under Se ction 564(b)(1) of the Act, 21 U.S.C. section 360bbb-3(b)(1), unless the authorization is terminated or revoked sooner.  Performed at [facility], [place] [address] [place], [place] [address]     RADIOLOGY:  No results found.   CODE STATUS:  Code Status History    Date Active Date Inactive Code Status Order ID Comments User Context   09/06/2020 1902 09/07/2020 1942 Full Code [id number]  Orene Desanctis, DO ED   08/26/2020 2258 08/27/2020 2219 Full Code [id number]  Lenore Cordia, MD ED   07/21/2016 1716 07/22/2016 2112 Full Code [id number]  Fritzi Mandes, MD Inpatient   06/30/2016 1705 07/03/2016 1815 Full Code [id number]  Vaughan Basta, MD Inpatient   Advance Care Planning Activity    Questions for Most Recent Historical Code Status (Order [id number])        TOTAL TIME TAKING CARE OF THIS PATIENT: *40* minutes.    Fritzi Mandes M.D  [facility]    CC: Primary care physician; Jodi Marble, MD

## 2020-10-02 NOTE — Progress Notes (Signed)
[facility] [place],  [address]  Pulmonary Sleep Medicine   Office Visit Note  Patient Name: Tristan Bender DOB: 11-Aug-1960 MRN [id number]    Chief Complaint: Obstructive Sleep Apnea visit  Brief History:  Tristan Bender is seen today for follow up sleep consult on CPAP @ 12 cmH2O.  The patient has 11 year* history of sleep apnea. Patient is using PAP nightly but only short intervals. The patient feels little difference after sleeping with PAP.  The patient reports enjoying therapy because it puts him straight to sleep. from PAP use. Reported sleepiness is  No longer a problem.  and the Epworth Sleepiness Score is 4 out of 24. The patient does  take naps. The patient complains of the following: CPAP blowing hot air despite not having heater turned on so he pulls it off without turning machine off; denies any mask leaks. The compliance download shows  compliance with an average use time of  2:12 hours. The AHI is 9.6  The patient does complain  of limb movements disrupting sleep.  ROS  General: (-) fever, (-) chills, (-) night sweat Nose and Sinuses: (-) nasal stuffiness or itchiness, (-) postnasal drip, (-) nosebleeds, (-) sinus trouble. Mouth and Throat: (-) sore throat, (-) hoarseness. Neck: (-) swollen glands, (-) enlarged thyroid, (-) neck pain. Respiratory: - cough, - shortness of breath, - wheezing. Neurologic: + numbness, + tingling (known neuropathy, stable)  Psychiatric: - anxiety, - depression   Current Medication: Outpatient Encounter Medications as of 10/05/2020  Medication Sig Note  . Cholecalciferol (VITAMIN D PO) Take 1.25 mcg by mouth daily. Takes before dialysis T-Th-S   . cinacalcet (SENSIPAR) 60 MG tablet Take 60 mg by mouth daily. (Patient not taking: Reported on 09/06/2020)   . ferrous sulfate 325 (65 FE) MG tablet Take 1 tablet (325 mg total) by mouth 2 (two) times daily with a meal. 08/26/2020: Takes tues, thurs and sat with dialysis   . lidocaine-prilocaine (EMLA) cream SMARTSIG:Sparingly Topical   . pantoprazole (PROTONIX) 40 MG tablet Take 1 tablet (40 mg total) by mouth 2 (two) times daily.   . pregabalin (LYRICA) 50 MG capsule Take 50 mg by mouth daily.   . prochlorperazine (COMPAZINE) 10 MG tablet Take 10 mg by mouth every 6 (six) hours as needed.   Marland Kitchen rOPINIRole (REQUIP) 0.5 MG tablet Take 0.5 mg by mouth 2 (two) times daily.   . sucralfate (CARAFATE) 1 g tablet Take 1 g by mouth 4 (four) times daily.   . traMADol (ULTRAM) 50 MG tablet Take 50 mg by mouth every 6 (six) hours as needed.    No facility-administered encounter medications on file as of 10/05/2020.    Surgical History: Past Surgical History:  Procedure Laterality Date  . ABDOMINAL SURGERY    . CHOLECYSTECTOMY    . COLON SURGERY    . DIALYSIS/PERMA CATHETER INSERTION N/A 06/30/2016   Procedure: Dialysis/Perma Catheter Insertion;  Surgeon: Algernon Huxley, MD;  Location: [facility];  Service: Cardiovascular;  Laterality: N/A;  . ESOPHAGOGASTRODUODENOSCOPY (EGD) WITH PROPOFOL N/A 07/01/2016   Procedure: ESOPHAGOGASTRODUODENOSCOPY (EGD) WITH PROPOFOL;  Surgeon: Lucilla Lame, MD;  Location: [facility] ENDOSCOPY;  Service: Endoscopy;  Laterality: N/A;  . ESOPHAGOGASTRODUODENOSCOPY (EGD) WITH PROPOFOL N/A 09/07/2020   Procedure: ESOPHAGOGASTRODUODENOSCOPY (EGD) WITH PROPOFOL;  Surgeon: Lin Landsman, MD;  Location: [facility];  Service: Gastroenterology;  Laterality: N/A;  . LAPAROSCOPIC GASTRIC SLEEVE RESECTION      Medical History: Past Medical History:  Diagnosis Date  .  Adenomatous duodenal polyp    jan 2018[facility]  . Hypertension   . Renal disorder     Family History: Non contributory to the present illness  Social History: Social History   Socioeconomic History  . Marital status: Divorced    Spouse name: Not on file  . Number of children: Not on file  . Years of education: Not on file  . Highest education level: Not on file   Occupational History  . Not on file  Tobacco Use  . Smoking status: Never Smoker  . Smokeless tobacco: Never Used  Substance and Sexual Activity  . Alcohol use: No  . Drug use: No  . Sexual activity: Yes  Other Topics Concern  . Not on file  Social History Narrative  . Not on file   Social Determinants of Health   Financial Resource Strain: Not on file  Food Insecurity: Not on file  Transportation Needs: Not on file  Physical Activity: Not on file  Stress: Not on file  Social Connections: Not on file  Intimate Partner Violence: Not on file    Vital Signs: There were no vitals taken for this visit.  Examination: General Appearance: The patient is well-developed, well-nourished, and in no distress. Neck Circumference: 42 Skin: Gross inspection of skin unremarkable. Head: normocephalic, no gross deformities. Eyes: no gross deformities noted. ENT: ears appear grossly normal Neurologic: Alert and oriented. No involuntary movements.    EPWORTH SLEEPINESS SCALE:  Scale:  (0)= no chance of dozing; (1)= slight chance of dozing; (2)= moderate chance of dozing; (3)= high chance of dozing  Chance  Situtation    Sitting and reading: 0    Watching TV: 2    Sitting Inactive in public: 0    As a passenger in car: 1      Lying down to rest: 1    Sitting and talking: 0    Sitting quielty after lunch: 0    In a car, stopped in traffic: 0   TOTAL SCORE:   4 out of 24    SLEEP STUDIES:  1. Split 08/15/2007  -  AHI 119.2,  Low SpO2 78%,  Repeat titration 08/23/2007 -  BiPAP 17/14 cmH2O   CPAP COMPLIANCE DATA:  Date Range: 08/31/20  - 09/29/20  Average Daily Use: 2:12 hours  Median Use: 1:59 hours  Compliance for > 4 Hours: 13% days  AHI: 9.6 respiratory events per hour  Days Used: 30/30 days  Mask Leak: 79.7 lpm  95th Percentile Pressure: 12 cmH2O         LABS: Recent Results (from the past 2160 hour(s))  CBC with Differential/Platelet      Status: Abnormal   Collection Time: 08/12/20  3:07 AM  Result Value Ref Range   WBC 7.4 4.0 - 10.5 K/uL   RBC 4.41 4.22 - 5.81 MIL/uL   Hemoglobin 12.3 (L) 13.0 - 17.0 g/dL   HCT 38.5 (L) 39.0 - 52.0 %   MCV 87.3 80.0 - 100.0 fL   MCH 27.9 26.0 - 34.0 pg   MCHC 31.9 30.0 - 36.0 g/dL   RDW 14.5 11.5 - 15.5 %   Platelets 239 150 - 400 K/uL   nRBC 0.0 0.0 - 0.2 %   Neutrophils Relative % 67 %   Neutro Abs 4.9 1.7 - 7.7 K/uL   Lymphocytes Relative 21 %   Lymphs Abs 1.5 0.7 - 4.0 K/uL   Monocytes Relative 9 %   Monocytes Absolute 0.7 0.1 - 1.0  K/uL   Eosinophils Relative 3 %   Eosinophils Absolute 0.2 0.0 - 0.5 K/uL   Basophils Relative 0 %   Basophils Absolute 0.0 0.0 - 0.1 K/uL   Immature Granulocytes 0 %   Abs Immature Granulocytes 0.02 0.00 - 0.07 K/uL    Comment: Performed at [facility], [address], [place], [place] [address]  Comprehensive metabolic panel     Status: Abnormal   Collection Time: 08/12/20  3:07 AM  Result Value Ref Range   Sodium 137 135 - 145 mmol/L   Potassium 5.1 3.5 - 5.1 mmol/L   Chloride 97 (L) 98 - 111 mmol/L   CO2 27 22 - 32 mmol/L   Glucose, Bld 100 (H) 70 - 99 mg/dL    Comment: Glucose reference range applies only to samples taken after fasting for at least 8 hours.   BUN 72 (H) 6 - 20 mg/dL   Creatinine, Ser 17.28 (H) 0.61 - 1.24 mg/dL   Calcium 8.9 8.9 - 10.3 mg/dL   Total Protein 7.2 6.5 - 8.1 g/dL   Albumin 3.7 3.5 - 5.0 g/dL   AST 10 (L) 15 - 41 U/L   ALT 7 0 - 44 U/L   Alkaline Phosphatase 52 38 - 126 U/L   Total Bilirubin 0.9 0.3 - 1.2 mg/dL   GFR, Estimated 3 (L) >60 mL/min    Comment: (NOTE) Calculated using the CKD-EPI Creatinine Equation (2021)    Anion gap 13 5 - 15    Comment: Performed at [facility], [address] [place], [place] [address]  Lipase, blood     Status: None   Collection Time: 08/12/20  3:07 AM  Result Value Ref Range   Lipase 38 11 - 51 U/L    Comment: Performed at [facility], [place], [place] [address]  Troponin I (High Sensitivity)     Status: Abnormal   Collection Time: 08/12/20  3:07 AM  Result Value Ref Range   Troponin I (High Sensitivity) 29 (H) <18 ng/L    Comment: (NOTE) Elevated high sensitivity troponin I (hsTnI) values and significant  changes across serial measurements may suggest ACS but many other  chronic and acute conditions are known to elevate hsTnI results.  Refer to the "Links" section for chest pain algorithms and additional  guidance. Performed at [facility], [address] [place], [place] [address]   Lactic acid, plasma     Status: None   Collection Time: 08/12/20  3:26 AM  Result Value Ref Range   Lactic Acid, Venous 0.8 0.5 - 1.9 mmol/L    Comment: Performed at [facility], [place], [place] [address]  Resp Panel by RT-PCR (Flu A&B, Covid) Nasopharyngeal Swab     Status: None   Collection Time: 08/12/20  3:49 AM   Specimen: Nasopharyngeal Swab; Nasopharyngeal(NP) swabs in vial transport medium  Result Value Ref Range   SARS Coronavirus 2 by RT PCR NEGATIVE NEGATIVE    Comment: (NOTE) SARS-CoV-2 target nucleic acids are NOT DETECTED.  The SARS-CoV-2 RNA is generally detectable in upper respiratory specimens during the acute phase of infection. The lowest concentration of SARS-CoV-2 viral copies this assay can detect is 138 copies/mL. A negative result does not preclude SARS-Cov-2 infection and should not be used as the sole basis for treatment or other patient management decisions. A negative result may occur with  improper specimen collection/handling, submission of specimen other than nasopharyngeal swab, presence  of viral mutation(s) within the areas targeted by this assay, and inadequate number of viral copies(<138 copies/mL). A negative result must be combined with clinical observations, patient history, and epidemiological information. The expected  result is Negative.  Fact Sheet for Patients:  EntrepreneurPulse.com.au  Fact Sheet for Healthcare Providers:  IncredibleEmployment.be  This test is no t yet approved or cleared by the [place] FDA and  has been authorized for detection and/or diagnosis of SARS-CoV-2 by FDA under an Emergency Use Authorization (EUA). This EUA will remain  in effect (meaning this test can be used) for the duration of the COVID-19 declaration under Section 564(b)(1) of the Act, 21 U.S.C.section 360bbb-3(b)(1), unless the authorization is terminated  or revoked sooner.       Influenza A by PCR NEGATIVE NEGATIVE   Influenza B by PCR NEGATIVE NEGATIVE    Comment: (NOTE) The Xpert Xpress SARS-CoV-2/FLU/RSV plus assay is intended as an aid in the diagnosis of influenza from Nasopharyngeal swab specimens and should not be used as a sole basis for treatment. Nasal washings and aspirates are unacceptable for Xpert Xpress SARS-CoV-2/FLU/RSV testing.  Fact Sheet for Patients: EntrepreneurPulse.com.au  Fact Sheet for Healthcare Providers: IncredibleEmployment.be  This test is not yet approved or cleared by the [place] FDA and has been authorized for detection and/or diagnosis of SARS-CoV-2 by FDA under an Emergency Use Authorization (EUA). This EUA will remain in effect (meaning this test can be used) for the duration of the COVID-19 declaration under Section 564(b)(1) of the Act, 21 U.S.C. section 360bbb-3(b)(1), unless the authorization is terminated or revoked.  Performed at [facility], [place], [place] [address]   Troponin I (High Sensitivity)     Status: Abnormal   Collection Time: 08/12/20  4:55 AM  Result Value Ref Range   Troponin I (High Sensitivity) 25 (H) <18 ng/L    Comment: (NOTE) Elevated high sensitivity troponin I (hsTnI) values and significant  changes across serial  measurements may suggest ACS but many other  chronic and acute conditions are known to elevate hsTnI results.  Refer to the "Links" section for chest pain algorithms and additional  guidance. Performed at [facility], [address] [place], [place] [address]   CBC with Differential     Status: Abnormal   Collection Time: 08/26/20  8:24 PM  Result Value Ref Range   WBC 6.3 4.0 - 10.5 K/uL   RBC 3.92 (L) 4.22 - 5.81 MIL/uL   Hemoglobin 11.0 (L) 13.0 - 17.0 g/dL   HCT 33.9 (L) 39.0 - 52.0 %   MCV 86.5 80.0 - 100.0 fL   MCH 28.1 26.0 - 34.0 pg   MCHC 32.4 30.0 - 36.0 g/dL   RDW 14.5 11.5 - 15.5 %   Platelets 230 150 - 400 K/uL   nRBC 0.0 0.0 - 0.2 %   Neutrophils Relative % 70 %   Neutro Abs 4.5 1.7 - 7.7 K/uL   Lymphocytes Relative 13 %   Lymphs Abs 0.8 0.7 - 4.0 K/uL   Monocytes Relative 12 %   Monocytes Absolute 0.8 0.1 - 1.0 K/uL   Eosinophils Relative 4 %   Eosinophils Absolute 0.2 0.0 - 0.5 K/uL   Basophils Relative 0 %   Basophils Absolute 0.0 0.0 - 0.1 K/uL   Immature Granulocytes 1 %   Abs Immature Granulocytes 0.03 0.00 - 0.07 K/uL    Comment: Performed at [facility], [address] [place], [place] [address]  Comprehensive  metabolic panel     Status: Abnormal   Collection Time: 08/26/20  8:24 PM  Result Value Ref Range   Sodium 140 135 - 145 mmol/L   Potassium 5.7 (H) 3.5 - 5.1 mmol/L   Chloride 99 98 - 111 mmol/L   CO2 26 22 - 32 mmol/L   Glucose, Bld 83 70 - 99 mg/dL    Comment: Glucose reference range applies only to samples taken after fasting for at least 8 hours.   BUN 83 (H) 6 - 20 mg/dL   Creatinine, Ser 20.80 (H) 0.61 - 1.24 mg/dL   Calcium 8.7 (L) 8.9 - 10.3 mg/dL   Total Protein 6.9 6.5 - 8.1 g/dL   Albumin 3.6 3.5 - 5.0 g/dL   AST 10 (L) 15 - 41 U/L   ALT <5 0 - 44 U/L   Alkaline Phosphatase 45 38 - 126 U/L   Total Bilirubin 0.5 0.3 - 1.2 mg/dL   GFR, Estimated 2 (L) >60 mL/min    Comment: (NOTE) Calculated using the  CKD-EPI Creatinine Equation (2021)    Anion gap 15 5 - 15    Comment: Performed at [facility] [address] [place], [place] [address]  Lipase, blood     Status: None   Collection Time: 08/26/20  8:24 PM  Result Value Ref Range   Lipase 35 11 - 51 U/L    Comment: Performed at [facility], [address] [place], [place] [address]  Resp Panel by RT-PCR (Flu A&B, Covid) Nasopharyngeal Swab     Status: None   Collection Time: 08/26/20 10:15 PM   Specimen: Nasopharyngeal Swab; Nasopharyngeal(NP) swabs in vial transport medium  Result Value Ref Range   SARS Coronavirus 2 by RT PCR NEGATIVE NEGATIVE    Comment: (NOTE) SARS-CoV-2 target nucleic acids are NOT DETECTED.  The SARS-CoV-2 RNA is generally detectable in upper respiratory specimens during the acute phase of infection. The lowest concentration of SARS-CoV-2 viral copies this assay can detect is 138 copies/mL. A negative result does not preclude SARS-Cov-2 infection and should not be used as the sole basis for treatment or other patient management decisions. A negative result may occur with  improper specimen collection/handling, submission of specimen other than nasopharyngeal swab, presence of viral mutation(s) within the areas targeted by this assay, and inadequate number of viral copies(<138 copies/mL). A negative result must be combined with clinical observations, patient history, and epidemiological information. The expected result is Negative.  Fact Sheet for Patients:  EntrepreneurPulse.com.au  Fact Sheet for Healthcare Providers:  IncredibleEmployment.be  This test is no t yet approved or cleared by the [place] FDA and  has been authorized for detection and/or diagnosis of SARS-CoV-2 by FDA under an Emergency Use Authorization (EUA). This EUA will remain  in effect (meaning this test can be used) for the duration of the COVID-19 declaration under  Section 564(b)(1) of the Act, 21 U.S.C.section 360bbb-3(b)(1), unless the authorization is terminated  or revoked sooner.       Influenza A by PCR NEGATIVE NEGATIVE   Influenza B by PCR NEGATIVE NEGATIVE    Comment: (NOTE) The Xpert Xpress SARS-CoV-2/FLU/RSV plus assay is intended as an aid in the diagnosis of influenza from Nasopharyngeal swab specimens and should not be used as a sole basis for treatment. Nasal washings and aspirates are unacceptable for Xpert Xpress SARS-CoV-2/FLU/RSV testing.  Fact Sheet for Patients: EntrepreneurPulse.com.au  Fact Sheet for Healthcare Providers: IncredibleEmployment.be  This test is not yet approved or  cleared by the [place] and has been authorized for detection and/or diagnosis of SARS-CoV-2 by FDA under an Emergency Use Authorization (EUA). This EUA will remain in effect (meaning this test can be used) for the duration of the COVID-19 declaration under Section 564(b)(1) of the Act, 21 U.S.C. section 360bbb-3(b)(1), unless the authorization is terminated or revoked.  Performed at [facility], [address] [place], [place] [address]   MRSA PCR Screening     Status: None   Collection Time: 08/27/20 12:30 AM   Specimen: Nasopharyngeal  Result Value Ref Range   MRSA by PCR NEGATIVE NEGATIVE    Comment:        The GeneXpert MRSA Assay (FDA approved for NASAL specimens only), is one component of a comprehensive MRSA colonization surveillance program. It is not intended to diagnose MRSA infection nor to guide or monitor treatment for MRSA infections. Performed at [facility], [address] [place], [place] [address]   HIV Antibody (routine testing w rflx)     Status: None   Collection Time: 08/27/20  5:01 AM  Result Value Ref Range   HIV Screen 4th Generation wRfx Non Reactive Non Reactive    Comment: Performed at [facility], [place] [address],  [place], [place] [address]  Comprehensive metabolic panel     Status: Abnormal   Collection Time: 08/27/20  5:01 AM  Result Value Ref Range   Sodium 140 135 - 145 mmol/L   Potassium 5.8 (H) 3.5 - 5.1 mmol/L   Chloride 100 98 - 111 mmol/L   CO2 24 22 - 32 mmol/L   Glucose, Bld 78 70 - 99 mg/dL    Comment: Glucose reference range applies only to samples taken after fasting for at least 8 hours.   BUN 84 (H) 6 - 20 mg/dL   Creatinine, Ser 21.67 (H) 0.61 - 1.24 mg/dL   Calcium 8.7 (L) 8.9 - 10.3 mg/dL   Total Protein 6.5 6.5 - 8.1 g/dL   Albumin 3.4 (L) 3.5 - 5.0 g/dL   AST 8 (L) 15 - 41 U/L   ALT <5 0 - 44 U/L   Alkaline Phosphatase 44 38 - 126 U/L   Total Bilirubin 0.6 0.3 - 1.2 mg/dL   GFR, Estimated 2 (L) >60 mL/min    Comment: (NOTE) Calculated using the CKD-EPI Creatinine Equation (2021)    Anion gap 16 (H) 5 - 15    Comment: Performed at [facility], [address] [place], [place] [address]  CBC     Status: Abnormal   Collection Time: 08/27/20  5:01 AM  Result Value Ref Range   WBC 6.7 4.0 - 10.5 K/uL   RBC 3.91 (L) 4.22 - 5.81 MIL/uL   Hemoglobin 11.1 (L) 13.0 - 17.0 g/dL   HCT 33.3 (L) 39.0 - 52.0 %   MCV 85.2 80.0 - 100.0 fL   MCH 28.4 26.0 - 34.0 pg   MCHC 33.3 30.0 - 36.0 g/dL   RDW 14.4 11.5 - 15.5 %   Platelets 216 150 - 400 K/uL   nRBC 0.0 0.0 - 0.2 %    Comment: Performed at [facility], [address] [place], [place] [address]  Protime-INR     Status: Abnormal   Collection Time: 08/27/20  5:01 AM  Result Value Ref Range   Prothrombin Time 16.1 (H) 11.4 - 15.2 seconds   INR 1.3 (H) 0.8 - 1.2    Comment: (NOTE) INR goal  varies based on device and disease states. Performed at [facility], [address] [place], [place] [address]   APTT     Status: None   Collection Time: 08/27/20  5:01 AM  Result Value Ref Range   aPTT 35 24 - 36 seconds    Comment: Performed at [facility], [address] [place], [place]  [address]  Lipase, blood     Status: None   Collection Time: 09/06/20  4:59 PM  Result Value Ref Range   Lipase 34 11 - 51 U/L    Comment: Performed at [facility], [address] [place], [place] [address]  Comprehensive metabolic panel     Status: Abnormal   Collection Time: 09/06/20  4:59 PM  Result Value Ref Range   Sodium 139 135 - 145 mmol/L   Potassium 5.0 3.5 - 5.1 mmol/L   Chloride 95 (L) 98 - 111 mmol/L   CO2 31 22 - 32 mmol/L   Glucose, Bld 83 70 - 99 mg/dL    Comment: Glucose reference range applies only to samples taken after fasting for at least 8 hours.   BUN 40 (H) 6 - 20 mg/dL   Creatinine, Ser 9.98 (H) 0.61 - 1.24 mg/dL   Calcium 8.9 8.9 - 10.3 mg/dL   Total Protein 7.2 6.5 - 8.1 g/dL   Albumin 3.8 3.5 - 5.0 g/dL   AST 17 15 - 41 U/L   ALT 7 0 - 44 U/L   Alkaline Phosphatase 38 38 - 126 U/L   Total Bilirubin 0.7 0.3 - 1.2 mg/dL   GFR, Estimated 5 (L) >60 mL/min    Comment: (NOTE) Calculated using the CKD-EPI Creatinine Equation (2021)    Anion gap 13 5 - 15    Comment: Performed at [facility], [address] [place], [place] [address]  CBC     Status: Abnormal   Collection Time: 09/06/20  4:59 PM  Result Value Ref Range   WBC 6.0 4.0 - 10.5 K/uL   RBC 3.45 (L) 4.22 - 5.81 MIL/uL   Hemoglobin 9.6 (L) 13.0 - 17.0 g/dL   HCT 30.1 (L) 39.0 - 52.0 %   MCV 87.2 80.0 - 100.0 fL   MCH 27.8 26.0 - 34.0 pg   MCHC 31.9 30.0 - 36.0 g/dL   RDW 14.9 11.5 - 15.5 %   Platelets 229 150 - 400 K/uL   nRBC 0.0 0.0 - 0.2 %    Comment: Performed at [facility], [address] [place], [place] [address]  APTT     Status: None   Collection Time: 09/06/20  6:06 PM  Result Value Ref Range   aPTT 34 24 - 36 seconds    Comment: Performed at [facility], [address] [place], [place] [address]  Protime-INR     Status: None   Collection Time: 09/06/20  6:06 PM  Result Value Ref Range   Prothrombin Time 13.9 11.4 - 15.2 seconds    INR 1.1 0.8 - 1.2    Comment: (NOTE) INR goal varies based on device and disease states. Performed at [facility], [address] [place],  [address]   Type and screen [place]     Status: None   Collection Time: 09/06/20  6:06 PM  Result Value Ref Range   ABO/RH(D) A POS    Antibody Screen NEG    Sample Expiration      09/09/2020,2359 Performed at  [facility], [place], [place] [address]   SARS CORONAVIRUS 2 (TAT 6-24 HRS) Nasopharyngeal Nasopharyngeal Swab     Status: None   Collection Time: 09/06/20  7:03 PM   Specimen: Nasopharyngeal Swab  Result Value Ref Range   SARS Coronavirus 2 NEGATIVE NEGATIVE    Comment: (NOTE) SARS-CoV-2 target nucleic acids are NOT DETECTED.  The SARS-CoV-2 RNA is generally detectable in upper and lower respiratory specimens during the acute phase of infection. Negative results do not preclude SARS-CoV-2 infection, do not rule out co-infections with other pathogens, and should not be used as the sole basis for treatment or other patient management decisions. Negative results must be combined with clinical observations, patient history, and epidemiological information. The expected result is Negative.  Fact Sheet for Patients: SugarRoll.be  Fact Sheet for Healthcare Providers: https://www.woods-mathews.com/  This test is not yet approved or cleared by the [place] FDA and  has been authorized for detection and/or diagnosis of SARS-CoV-2 by FDA under an Emergency Use Authorization (EUA). This EUA will remain  in effect (meaning this test can be used) for the duration of the COVID-19 declaration under Se ction 564(b)(1) of the Act, 21 U.S.C. section 360bbb-3(b)(1), unless the authorization is terminated or revoked sooner.  Performed at [facility], [address], [place], [place] [address]   Comprehensive metabolic panel      Status: Abnormal   Collection Time: 09/07/20  5:00 AM  Result Value Ref Range   Sodium 138 135 - 145 mmol/L   Potassium 4.8 3.5 - 5.1 mmol/L   Chloride 97 (L) 98 - 111 mmol/L   CO2 30 22 - 32 mmol/L   Glucose, Bld 76 70 - 99 mg/dL    Comment: Glucose reference range applies only to samples taken after fasting for at least 8 hours.   BUN 45 (H) 6 - 20 mg/dL   Creatinine, Ser 11.42 (H) 0.61 - 1.24 mg/dL   Calcium 8.5 (L) 8.9 - 10.3 mg/dL   Total Protein 6.2 (L) 6.5 - 8.1 g/dL   Albumin 3.1 (L) 3.5 - 5.0 g/dL   AST 12 (L) 15 - 41 U/L   ALT 6 0 - 44 U/L   Alkaline Phosphatase 32 (L) 38 - 126 U/L   Total Bilirubin 0.7 0.3 - 1.2 mg/dL   GFR, Estimated 5 (L) >60 mL/min    Comment: (NOTE) Calculated using the CKD-EPI Creatinine Equation (2021)    Anion gap 11 5 - 15    Comment: Performed at [facility], [address] [place], [place] [address]  Ferritin     Status: Abnormal   Collection Time: 09/07/20  7:38 AM  Result Value Ref Range   Ferritin 596 (H) 24 - 336 ng/mL    Comment: Performed at [facility], [address] [place], [place] [address]  Iron and TIBC     Status: Abnormal   Collection Time: 09/07/20  7:38 AM  Result Value Ref Range   Iron 47 45 - 182 ug/dL   TIBC 164 (L) 250 - 450 ug/dL   Saturation Ratios 29 17.9 - 39.5 %   UIBC 117 ug/dL    Comment: Performed at [facility], [address] [place], [place] [address]  Folate     Status: Abnormal   Collection Time: 09/07/20  7:38 AM  Result Value Ref Range   Folate 4.6 (L) >5.9 ng/mL    Comment: Performed at [facility], [address] [place], [place] [address]  Vitamin B12     Status: None   Collection Time: 09/07/20 11:15 AM  Result Value Ref Range   Vitamin B-12 576 180 - 914 pg/mL    Comment: (NOTE) This assay is not validated for testing neonatal or myeloproliferative syndrome specimens for Vitamin B12 levels. Performed at [facility], [place] [address], [place], [place] [address]   Surgical pathology     Status: None   Collection Time: 09/07/20 11:55 AM  Result Value Ref Range   SURGICAL PATHOLOGY      SURGICAL PATHOLOGY CASE: [id number] PATIENT: Burlin Spake Surgical Pathology Report     Specimen Submitted: A. Duodenum polyp, 2nd portion; cbx B. Stomach, random; cbx C. Esophagus, salmon colored mucosa; cbx  Clinical History: Epigastric pain. 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 Findings: Duodenal polyp, gastric erythema, hiatal hernia, esophagitis, salmon colored mucosa lower esophagus     DIAGNOSIS: A.  DUODENAL POLYP, SECOND PORTION; COLD BIOPSY: - TUBULAR ADENOMA OF DUODENUM, MULTIPLE FRAGMENTS. - NEGATIVE FOR HIGH-GRADE DYSPLASIA AND MALIGNANCY.  B.  STOMACH; RANDOM COLD BIOPSY: - ANTRAL AND OXYNTIC MUCOSA WITHOUT PATHOLOGIC CHANGES. - NEGATIVE FOR H. PYLORI, INTESTINAL METAPLASIA, DYSPLASIA, AND MALIGNANCY.  C.  ESOPHAGUS, SALMON-COLORED MUCOSA; COLD BIOPSY: - EROSIVE ESOPHAGITIS INVOLVING SQUAMOUS-LINED MUCOSA. - NEGATIVE FOR VIRAL CYTOPATHIC EFFECTS, PILL FRAGMENTS, DYSPLASIA, AND MALIGNANCY.   GROSS DESCRIPTION: A. Labeled: cbx duodenal polyp second portion Received: Formalin Collection time: 11:55 AM on 09/07/2020 Placed into formalin time: 11:55 AM on 09/07/2020 Tissue fragment(s): Multiple Size: Aggregate, 1.1 x 0.3 x 0.2 cm Description: Tan soft tissue fragments Entirely submitted in 1 cassette.  B. Labeled: cbx random gastric (gastric erythema) Received: Formalin Collection time: 11:58 AM on 09/07/2020 Placed into formalin time: 11:58 AM on 09/07/2020 Tissue fragment(s): 3 Size: Aggregate, 0.9 x 0.4 x 0.2 cm Description: Tan soft tissue fragments Entirely submitted in 1 cassette.  C. Labeled: cbx salmon-colored mucosa lower esophagus Received: Formalin Collection time: 12:01 PM on 09/07/2020 Placed into formalin time: 12:01 PM on 09/07/2020 Tissue  fragment(s): Multiple Size: Aggregate, 1.2 x 0.3 x 0.2 cm Description: White translucent soft tissue fragments Entirely submitted in 1 cassette.  RB 09/07/2020  Final Diagnosis performed by Bryan Lemma, Artist Pais   Electronically signed 09/08/2020 4:47:16PM The electronic signature indicates that the named Attending Pathologist has evaluated the specimen Technical component performed at [facility], [address], [place], [place] [address] Lab: [phone] Dir: Rush Farmer, MD, MMM  Professional component performed at [facility], [place], [place], [place] [address] Lab: [phone] Dir: Dellia Nims. Reuel Derby, MD     Radiology: No results found.  No results found.  No results found.    Assessment and Plan: Patient Active Problem List   Diagnosis Date Noted  . OSA treated with BiPAP 10/05/2020  . Difficulty with BiPAP use 10/05/2020  . Restless leg syndrome 10/05/2020  . CPAP use counseling 10/05/2020  . Hematemesis with nausea   . Hemoptysis 09/06/2020  . History of sleeve gastrectomy 09/06/2020  . RUQ abdominal pain 08/26/2020  . CPAP (continuous positive airway pressure) dependence 07/13/2020  . Chronic kidney disease requiring chronic dialysis ([place]) 05/18/2020  . Restless legs syndrome 05/18/2020  . OSA on CPAP 10/29/2019  . BMI 45.0-49.9, adult ([place]) 03/11/2019  . Anemia 07/21/2016  . Iron deficiency anemia due to chronic blood loss   . Other diseases of stomach and duodenum   . Chronic duodenal ulcer with  hemorrhage   . GI bleed 06/30/2016  . Symptomatic anemia 06/30/2016  . Class 3 obesity with body mass index (BMI) of 40.0 to 44.9 in adult 06/10/2016  . ESRD on hemodialysis ([place]) 05/16/2016  . Anemia in chronic kidney disease 09/30/2015  . Iron deficiency 04/02/2015  . HTN (hypertension) 03/20/2015   1. Restless leg syndrome Stable, his symptoms are currently controlled with his keeping a space heater heating his legs.   2. OSA  on CPAP The patient does tolerate PAP and reports definite benefit from PAP use.He is struggling with feeling hot with pap use and will try to adjust his room temperature to allow for more restless sleep. He will work on his compliance.   1. OSA- improve compliance. F/u in 2 months    3. CPAP use counseling CPAP Counseling: had a lengthy discussion with the patient regarding the importance of PAP therapy in management of the sleep apnea. Patient appears to understand the risk factor reduction and also understands the risks associated with untreated sleep apnea. Patient will try to make a good faith effort to remain compliant with therapy. Also instructed the patient on proper cleaning of the device including the water must be changed daily if possible and use of distilled water is preferred. Patient understands that the machine should be regularly cleaned with appropriate recommended cleaning solutions that do not damage the PAP machine for example given white vinegar and water rinses. Other methods such as ozone treatment may not be as good as these simple methods to achieve cleaning.   General Counseling: I have discussed the findings of the evaluation and examination with Tristan Bender.  I have also discussed any further diagnostic evaluation thatmay be needed or ordered today. Renne verbalizes understanding of the findings of todays visit. We also reviewed his medications today and discussed drug interactions and side effects including but not limited excessive drowsiness and altered mental states. We also discussed that there is always a risk not just to him but also people around him. he has been encouraged to call the office with any questions or concerns that should arise related to todays visit.  No orders of the defined types were placed in this encounter.       I have personally obtained a history, examined the patient, evaluated laboratory and imaging results, formulated the assessment and  plan and placed orders.   This patient was seen today by Tressie Ellis, PA-C in collaboration with Dr. Devona Konig.  Allyne Gee, MD [facility] Diplomate ABMS Pulmonary and Critical Care Medicine Sleep medicine

## 2020-10-05 ENCOUNTER — Ambulatory Visit (INDEPENDENT_AMBULATORY_CARE_PROVIDER_SITE_OTHER): Payer: Medicare Other | Admitting: Internal Medicine

## 2020-10-05 DIAGNOSIS — G2581 Restless legs syndrome: Secondary | ICD-10-CM | POA: Diagnosis not present

## 2020-10-05 DIAGNOSIS — Z9989 Dependence on other enabling machines and devices: Secondary | ICD-10-CM

## 2020-10-05 DIAGNOSIS — Z789 Other specified health status: Secondary | ICD-10-CM | POA: Insufficient documentation

## 2020-10-05 DIAGNOSIS — Z7189 Other specified counseling: Secondary | ICD-10-CM | POA: Diagnosis not present

## 2020-10-05 DIAGNOSIS — G4733 Obstructive sleep apnea (adult) (pediatric): Secondary | ICD-10-CM | POA: Diagnosis not present

## 2020-10-05 NOTE — Patient Instructions (Signed)

## 2020-10-06 ENCOUNTER — Other Ambulatory Visit: Payer: Self-pay

## 2020-10-08 ENCOUNTER — Ambulatory Visit (INDEPENDENT_AMBULATORY_CARE_PROVIDER_SITE_OTHER): Payer: Medicare Other | Admitting: Gastroenterology

## 2020-10-08 ENCOUNTER — Other Ambulatory Visit: Payer: Self-pay

## 2020-10-08 ENCOUNTER — Encounter: Payer: Self-pay | Admitting: Gastroenterology

## 2020-10-08 VITALS — BP 116/73 | HR 81 | Temp 97.9°F | Ht 67.0 in | Wt 199.5 lb

## 2020-10-08 DIAGNOSIS — M109 Gout, unspecified: Secondary | ICD-10-CM | POA: Insufficient documentation

## 2020-10-08 DIAGNOSIS — E538 Deficiency of other specified B group vitamins: Secondary | ICD-10-CM | POA: Diagnosis not present

## 2020-10-08 DIAGNOSIS — D649 Anemia, unspecified: Secondary | ICD-10-CM

## 2020-10-08 DIAGNOSIS — Z1211 Encounter for screening for malignant neoplasm of colon: Secondary | ICD-10-CM

## 2020-10-08 DIAGNOSIS — K2210 Ulcer of esophagus without bleeding: Secondary | ICD-10-CM

## 2020-10-08 DIAGNOSIS — D132 Benign neoplasm of duodenum: Secondary | ICD-10-CM

## 2020-10-08 MED ORDER — NA SULFATE-K SULFATE-MG SULF 17.5-3.13-1.6 GM/177ML PO SOLN: 354.0000 mL | Freq: Once | ORAL | 0 refills | Status: AC

## 2020-10-08 NOTE — Progress Notes (Signed)
Cephas Darby, MD [address]  [place]  [place], [place] [address]  Main: [phone]  Fax: [phone]    Gastroenterology Consultation  Referring Provider:     Jodi Marble, MD Primary Care Physician:  Jodi Marble, MD Primary Gastroenterologist:  Dr. Cephas Darby Reason for Consultation:     Hospital follow-up        HPI:   Tristan Bender is a 60 y.o. male referred by Dr. Jodi Marble, MD  for consultation & management of recent hospitalization for nausea and hematemesis.  He was found to have severe erosive esophagitis as well as large duodenal polyp.  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 , history of hypertension.  Patient does have mild anemia compared to baseline.  Interval summary Patient reports doing well since discharge from the hospital.  He is currently taking Protonix 40 mg daily.  He has appointment with advanced endoscopist, Dr. Ivor Messier, at [facility] on 12/01/2020 to discuss about endoscopic resection of duodenal adenoma.  NSAIDs: None  Antiplts/Anticoagulants/Anti thrombotics: None  GI Procedures: Endoscopy in 2018, large duodenal mass, pathology showed duodenal adenoma Repeat endoscopy at [facility], EMR of the duodenal polyp  Upper endoscopy 09/07/2020 - A single duodenal polyp. Biopsied. - Erythematous mucosa in the stomach. Biopsied. - LA Grade D reflux esophagitis with stigmata of recent bleeding, source of hematemesis. - Salmon-colored mucosa suspicious for long-segment Barrett's esophagus. Biopsied.  DIAGNOSIS:  A. DUODENAL POLYP, SECOND PORTION; COLD BIOPSY:  - TUBULAR ADENOMA OF DUODENUM, MULTIPLE FRAGMENTS.  - NEGATIVE FOR HIGH-GRADE DYSPLASIA AND MALIGNANCY.   B. STOMACH; RANDOM COLD BIOPSY:  - ANTRAL AND  OXYNTIC MUCOSA WITHOUT PATHOLOGIC CHANGES.  - NEGATIVE FOR H. PYLORI, INTESTINAL METAPLASIA, DYSPLASIA, AND  MALIGNANCY.   C. ESOPHAGUS, SALMON-COLORED MUCOSA; COLD BIOPSY:  - EROSIVE ESOPHAGITIS INVOLVING SQUAMOUS-LINED MUCOSA.  - NEGATIVE FOR VIRAL CYTOPATHIC EFFECTS, PILL FRAGMENTS, DYSPLASIA, AND  MALIGNANCY.    Past Medical History:  Diagnosis Date  . Adenomatous duodenal polyp    [facility]  . Hypertension   . Renal disorder     Past Surgical History:  Procedure Laterality Date  . ABDOMINAL SURGERY    . CHOLECYSTECTOMY    . COLON SURGERY    . DIALYSIS/PERMA CATHETER INSERTION N/A 06/30/2016   Procedure: Dialysis/Perma Catheter Insertion;  Surgeon: Algernon Huxley, MD;  Location: [facility];  Service: Cardiovascular;  Laterality: N/A;  . ESOPHAGOGASTRODUODENOSCOPY (EGD) WITH PROPOFOL N/A 07/01/2016   Procedure: ESOPHAGOGASTRODUODENOSCOPY (EGD) WITH PROPOFOL;  Surgeon: Lucilla Lame, MD;  Location: [facility] ENDOSCOPY;  Service: Endoscopy;  Laterality: N/A;  . ESOPHAGOGASTRODUODENOSCOPY (EGD) WITH PROPOFOL N/A 09/07/2020   Procedure: ESOPHAGOGASTRODUODENOSCOPY (EGD) WITH PROPOFOL;  Surgeon: Lin Landsman, MD;  Location: [facility] ENDOSCOPY;  Service: Gastroenterology;  Laterality: N/A;  . LAPAROSCOPIC GASTRIC SLEEVE RESECTION      Current Outpatient Medications:  .  calcium acetate (PHOSLO) 667 MG capsule, 667 mg 3 (three) times a day with meals., Disp: , Rfl:  .  Cholecalciferol (VITAMIN D PO), Take 1.25 mcg by mouth daily. Takes before dialysis T-Th-S, Disp: , Rfl:  .  ferrous sulfate 325 (65 FE) MG tablet, Take 1 tablet (325 mg total) by mouth 2 (two) times daily with a meal., Disp: 60 tablet, Rfl: 3 .  lidocaine-prilocaine (EMLA) cream, SMARTSIG:Sparingly  Topical, Disp: , Rfl:  .  Methoxy PEG-Epoetin Beta (MIRCERA IJ), Mircera, Disp: , Rfl:  .  Na Sulfate-K Sulfate-Mg Sulf 17.5-3.13-1.6 GM/177ML SOLN, Take 354 mLs by mouth once for 1 dose., Disp: 354 mL, Rfl: 0 .   ondansetron (ZOFRAN-ODT) 8 MG disintegrating tablet, Take 8 mg by mouth every 8 (eight) hours as needed., Disp: , Rfl:  .  pantoprazole (PROTONIX) 40 MG tablet, Take 1 tablet (40 mg total) by mouth 2 (two) times daily., Disp: 60 tablet, Rfl: 1 .  pregabalin (LYRICA) 50 MG capsule, Take 50 mg by mouth daily., Disp: , Rfl:  .  rOPINIRole (REQUIP) 0.5 MG tablet, Take 0.5 mg by mouth 2 (two) times daily., Disp: , Rfl:  .  sucralfate (CARAFATE) 1 g tablet, Take 1 g by mouth 4 (four) times daily., Disp: , Rfl:  .  sucroferric oxyhydroxide (VELPHORO) 500 MG chewable tablet, CRUSH OR CHEW AND SWALLOW 1 TABLET 3 TIMES A DAY WITH MEALS, Disp: , Rfl:  .  traMADol (ULTRAM) 50 MG tablet, Take 50 mg by mouth every 6 (six) hours as needed., Disp: , Rfl:    Family History  Problem Relation Age of Onset  . Renal Disease Maternal Uncle      Social History   Tobacco Use  . Smoking status: Never Smoker  . Smokeless tobacco: Never Used  Substance Use Topics  . Alcohol use: No  . Drug use: No    Allergies as of 10/08/2020  . (No Known Allergies)    Review of Systems:    All systems reviewed and negative except where noted in HPI.   Physical Exam:  BP 116/73 (BP Location: Left Arm, Patient Position: Sitting, Cuff Size: Normal)   Pulse 81   Temp 97.9 F (36.6 C) (Oral)   Ht 5\' 7"  (1.702 m)   Wt 199 lb 8 oz (90.5 kg)   BMI 31.25 kg/m  No LMP for male patient.  General:   Alert,  Well-developed, well-nourished, pleasant and cooperative in NAD Head:  Normocephalic and atraumatic. Eyes:  Sclera clear, no icterus.   Conjunctiva pink. Ears:  Normal auditory acuity. Nose:  No deformity, discharge, or lesions. Mouth:  No deformity or lesions,oropharynx pink & moist. Neck:  Supple; no masses or thyromegaly. Lungs:  Respirations even and unlabored.  Clear throughout to auscultation.   No wheezes, crackles, or rhonchi. No acute distress. Heart:  Regular rate and rhythm; no murmurs, clicks, rubs, or  gallops. Abdomen:  Normal bowel sounds. Soft, non-tender and non-distended without masses, hepatosplenomegaly or hernias noted.  No guarding or rebound tenderness.   Rectal: Not performed Msk:  Symmetrical without gross deformities. Good, equal movement & strength bilaterally. Pulses:  Normal pulses noted. Extremities:  No clubbing or edema.  No cyanosis. Neurologic:  Alert and oriented x3;  grossly normal neurologically. Skin:  Intact without significant lesions or rashes. No jaundice. Psych:  Alert and cooperative. Normal mood and affect.  Imaging Studies: Reviewed  Assessment and Plan:   Tristan Bender is a 60 y.o. male with history of stage renal disease on hemodialysis, history of duodenal adenoma s/p EMR in 2018 at [facility], history of sleeve gastrectomy in 2021 is seen as a hospital follow-up for hematemesis secondary to LA grade D esophagitis.  Erosive esophagitis Advised patient to increase Protonix to 40 mg p.o. twice daily He will follow-up with [facility] for an upper endoscopy resection of duodenal adenoma, at the same time, he will be assessed for healing of esophagitis and  to rule out underlying Barrett's esophagus  Colon cancer screening Patient is overdue for colon cancer screening and he is agreeable to undergo colonoscopy  Anemia and folate deficiency Recheck CBC, iron panel, B12 and folate levels today   Follow up as needed   Cephas Darby, MD

## 2020-10-09 LAB — CBC
Hematocrit: 31.1 % — ABNORMAL LOW (ref 37.5–51.0)
Hemoglobin: 10.2 g/dL — ABNORMAL LOW (ref 13.0–17.7)
MCH: 27.7 pg (ref 26.6–33.0)
MCHC: 32.8 g/dL (ref 31.5–35.7)
MCV: 85 fL (ref 79–97)
Platelets: 190 10*3/uL (ref 150–450)
RBC: 3.68 x10E6/uL — ABNORMAL LOW (ref 4.14–5.80)
RDW: 15.4 % (ref 11.6–15.4)
WBC: 5.8 10*3/uL (ref 3.4–10.8)

## 2020-10-09 LAB — FERRITIN: Ferritin: 939 ng/mL — ABNORMAL HIGH (ref 30–400)

## 2020-10-09 LAB — B12 AND FOLATE PANEL
Folate: 2.9 ng/mL — ABNORMAL LOW (ref >3.0)
Vitamin B-12: 805 pg/mL (ref 232–1245)

## 2020-10-13 ENCOUNTER — Telehealth: Payer: Self-pay

## 2020-10-13 NOTE — Telephone Encounter (Signed)
-----   Message from Lin Landsman, MD sent at 10/09/2020  9:56 AM EDT ----- Caryl Pina  He has severe folic acid deficiency, recommend to start oral folic acid and take 2 mg daily for 1 week then 1 mg daily for 1 month Highly recommend to stop oral iron supplements and iron infusions at this time because his iron levels are very high which is not good for his liver  Rohini Vanga

## 2020-10-13 NOTE — Telephone Encounter (Signed)
Sent mychart message with results

## 2020-10-15 ENCOUNTER — Encounter: Payer: Self-pay | Admitting: Gastroenterology

## 2020-10-19 ENCOUNTER — Encounter: Payer: Self-pay | Admitting: Anesthesiology

## 2020-10-27 ENCOUNTER — Telehealth: Payer: Self-pay

## 2020-10-27 NOTE — Telephone Encounter (Signed)
Patient is calling because she needs to rescheduled his procedure scheduled for 10/29/2020. He states he wants to move it to 12/03/2020. Called ENDO and moved patient to then 12/03/2020

## 2020-11-05 ENCOUNTER — Ambulatory Visit: Payer: Medicare Other | Admitting: Gastroenterology

## 2020-11-13 ENCOUNTER — Encounter: Payer: Self-pay | Admitting: Gastroenterology

## 2020-11-17 ENCOUNTER — Encounter: Payer: Self-pay | Admitting: Gastroenterology

## 2020-11-18 ENCOUNTER — Encounter: Payer: Self-pay | Admitting: Gastroenterology

## 2020-12-03 ENCOUNTER — Ambulatory Visit: Admission: RE | Admit: 2020-12-03 | Payer: Medicare Other | Source: Home / Self Care | Admitting: Gastroenterology

## 2020-12-03 HISTORY — DX: Sleep apnea, unspecified: G47.30

## 2020-12-03 HISTORY — DX: Anemia, unspecified: D64.9

## 2020-12-03 HISTORY — DX: Other complications of anesthesia, initial encounter: T88.59XA

## 2020-12-03 SURGERY — COLONOSCOPY WITH PROPOFOL
Anesthesia: Choice

## 2020-12-21 ENCOUNTER — Other Ambulatory Visit: Payer: Self-pay

## 2020-12-21 ENCOUNTER — Ambulatory Visit: Payer: Medicare Other | Admitting: Internal Medicine

## 2020-12-21 NOTE — Progress Notes (Signed)
Pt did not show for scheduled appointment.  

## 2021-01-05 DIAGNOSIS — K631 Perforation of intestine (nontraumatic): Secondary | ICD-10-CM

## 2021-01-05 DIAGNOSIS — K839 Disease of biliary tract, unspecified: Secondary | ICD-10-CM

## 2021-01-05 DIAGNOSIS — Z789 Other specified health status: Secondary | ICD-10-CM | POA: Insufficient documentation

## 2021-01-05 HISTORY — DX: Disease of biliary tract, unspecified: K83.9

## 2021-01-05 HISTORY — DX: Perforation of intestine (nontraumatic): K63.1

## 2021-01-12 DIAGNOSIS — Z4803 Encounter for change or removal of drains: Secondary | ICD-10-CM | POA: Insufficient documentation

## 2021-01-12 DIAGNOSIS — T888XXA Other specified complications of surgical and medical care, not elsewhere classified, initial encounter: Secondary | ICD-10-CM | POA: Insufficient documentation

## 2021-01-21 ENCOUNTER — Emergency Department
Admission: EM | Admit: 2021-01-21 | Discharge: 2021-01-21 | Disposition: A | Discharge status: Home / Self Care | Payer: Medicare Other | Attending: Emergency Medicine | Admitting: Emergency Medicine

## 2021-01-21 ENCOUNTER — Other Ambulatory Visit: Payer: Self-pay

## 2021-01-21 DIAGNOSIS — N186 End stage renal disease: Secondary | ICD-10-CM | POA: Insufficient documentation

## 2021-01-21 DIAGNOSIS — D631 Anemia in chronic kidney disease: Secondary | ICD-10-CM | POA: Insufficient documentation

## 2021-01-21 DIAGNOSIS — G8929 Other chronic pain: Secondary | ICD-10-CM | POA: Diagnosis not present

## 2021-01-21 DIAGNOSIS — R1084 Generalized abdominal pain: Secondary | ICD-10-CM | POA: Diagnosis not present

## 2021-01-21 DIAGNOSIS — Z992 Dependence on renal dialysis: Secondary | ICD-10-CM | POA: Insufficient documentation

## 2021-01-21 DIAGNOSIS — I120 Hypertensive chronic kidney disease with stage 5 chronic kidney disease or end stage renal disease: Secondary | ICD-10-CM | POA: Diagnosis not present

## 2021-01-21 DIAGNOSIS — I959 Hypotension, unspecified: Secondary | ICD-10-CM | POA: Diagnosis not present

## 2021-01-21 MED ORDER — DICYCLOMINE HCL 10 MG PO CAPS
10.0000 mg | ORAL_CAPSULE | Freq: Once | ORAL | Status: AC
  Administered 2021-01-21: 10 mg via ORAL
  Filled 2021-01-21: qty 1

## 2021-01-21 MED ORDER — DICYCLOMINE HCL 20 MG PO TABS: 20.0000 mg | ORAL_TABLET | Freq: Three times a day (TID) | ORAL | 0 refills | Status: DC | PRN

## 2021-01-21 NOTE — ED Provider Notes (Signed)
[facility] Emergency Department Provider Note  ____________________________________________   Event Date/Time   First MD Initiated Contact with Patient 01/21/21 [phone]     (approximate)  I have reviewed the triage vital signs and the nursing notes.   HISTORY  Chief Complaint Abdominal Pain    HPI Tristan Bender is a 59 y.o. male with history of gastric sleeve, cholecystectomy, ESRD on dialysis, hypotension, anemia, EGD with removal of duodenal polyp on 12/02/2020 where the patient suffered a duodenal perforation and subsequently underwent ex lap with pyloric exclusion with repair of duodenal perforation with omental patch and placement of a GJ tube, GJ tube malfunction with exchange by VIR on 01/06/2021 who presents to the ED with complaints of diffuse abdominal pain.  States that this is his chronic abdominal pain that has been going on for months.  It has not changed in nature.  He denies fevers, chest pain, shortness of breath, vomiting, diarrhea, dysuria, hematuria.  He has had his drains removed.  He had a CT scan on 01/17/2021 which showed decrease in size of the left retroperitoneal fluid collection and had his pigtail drain removed at that time.  Augmentin was also stopped at that time.  He has follow-up with general surgery at [facility] on 01/28/2021.  States he cannot get into see his primary care doctor until tomorrow and states he called the general surgeon telephone triage line yesterday requesting pain medication but was told that this could not be provided to him over the phone.  He states that he is scheduled for dialysis this morning and was hoping to get something for pain prior to going to dialysis.  He is noted to be hypotensive here but states this is normal for him.  He has no complaints of lightheadedness.  On review of his records it does appear that he often has systolic pressures in the 70s to 80s.  His last office visit with infectious disease on 01/14/2021  showed a blood pressure of 75/49.  He is refusing further work-up for this at this time.    CTAP 01/17/21 at [facility]:  TECHNIQUE: A spiral CT scan was obtained without IV contrast from the lung bases to the pubic symphysis.  Images were reconstructed in the axial plane. Coronal and sagittal reformatted images were also provided for further evaluation.   Evaluation of the solid organs and vasculature is limited in the absence of intravenous contrast.   FINDINGS:   LINES/DEVICES: Gastrojejunostomy in place. Left retroperitoneal percutaneous pigtail drainage catheter with tip inferolateral to the left native kidney. Internal/external biliary drain with pigtail tip in the duodenum. Removal of previously seen percutaneous surgical drain from the right anterior abdominal wall.   LOWER CHEST: Small pericardial effusion, unchanged. Small hiatal hernia, including a herniated portion of suture line from previous gastric bypass procedure, unchanged.   ABDOMEN/PELVIS   HEPATOBILIARY: Small volume pneumobilia, similar to prior. No focal liver lesion. Right lateral approach percutaneous internal/external biliary drain with tip in the duodenum, similar to prior. Normal hepatic contour. No biliary ductal dilatation. The gallbladder is likely surgically absent.  PANCREAS: Normal pancreatic contour without sign of inflammation or gross ductal dilatation.  SPLEEN: Normal in size and contour.  ADRENAL GLANDS: Mild thickening of the bilateral adrenal glands with mild surrounding stranding, nonspecific.  KIDNEYS/URETERS: Atrophic native kidneys, both with numerous cysts, similar to prior. No nephrolithiasis. No ureteral dilatation or collecting system distention.  BLADDER: Limited evaluation due to underdistention.  BOWEL/PERITONEUM/RETROPERITONEUM: Small hiatal hernia, as mentioned above.  Sequelae of gastric bypass, unchanged. No bowel obstruction. Sequelae of omental patch repair for duodenal perforation, similar to  prior. No ascites. There is a thin tract containing some gas coursing through the central mesentery to the site of previous percutaneous surgical drain exiting the right anterior abdominal wall. Redemonstrated retroperitoneal fluid and gas collection extending from posterior to the duodenum superiorly to the level of the superior aspect of the left kidney (2:61-2:88), slightly decreased in size from prior. The collection measures approximately 6.3 cm in AP dimension, but is very thin (3:64). The pigtail catheter tip is in the midportion of this collection (2:78), and then the collection courses inferomedially, where it measures approximately 8.2 cm in length, but is again very thin (2:79) and located just posterior inferior to the duodenum.   VASCULATURE: Normal in caliber. Moderate calcified atherosclerotic disease, similar to prior.  LYMPH NODES: No adenopathy.  REPRODUCTIVE ORGANS: Unremarkable.   BONES/SOFT TISSUES: Postsurgical change of the anterior abdominal wall. Small fat-containing left inguinal hernia. Diffuse osseous sclerosis, lytic lesion in the right ilium, and L1 Schmorl nodes are again visualized.   IMPRESSION:  Thin, curvilinear retroperitoneal fluid and gas collection with pigtail tip in its midportion measures approximately 6.3 cm in length adjacent to the left kidney and then courses inferomedially, where it measures approximately 8.2 cm in length. In both of the locations where this collection is measured, it is thin (approximately 1.0 cm or less). This collection is mildly decreased in volume from 01/13/2021 CT.   Percutaneous surgical drain has been removed from the right anterior abdominal wall. There is a thin intra-abdominal air-containing tract visualized along the course of the tubing. Attention on follow-up imaging to ensure that this tract does not remain patent or result in a fistula (not suspected at this time).   Additional chronic/incidental findings as above.     Past Medical History:  Diagnosis Date   Adenomatous duodenal polyp    jan 2018- [facility]   Anemia    Complication of anesthesia    had to abort previous colonoscopy due to vomiting   GI bleed 06/30/2016   Hypertension    Renal disorder    on dialysis T, TH,SAT   Sleep apnea    uses CPAP    Patient Active Problem List   Diagnosis Date Noted   Gout 10/08/2020   OSA treated with BiPAP 10/05/2020   Difficulty with BiPAP use 10/05/2020   Restless leg syndrome 10/05/2020   CPAP use counseling 10/05/2020   Hematemesis with nausea    Hemoptysis 09/06/2020   History of sleeve gastrectomy 09/06/2020   Other chronic pain 09/04/2020   RUQ abdominal pain 70/05/7492   Biliary colic 49/67/5916   CPAP (continuous positive airway pressure) dependence 07/13/2020   Chronic kidney disease requiring chronic dialysis ([place]) 05/18/2020   Restless legs syndrome 05/18/2020   Status post bariatric surgery 05/14/2020   OSA on CPAP 10/29/2019   Numbness of right foot 03/27/2019   Polyp of intestine 09/09/2016   Anemia 07/21/2016   Other diseases of stomach and duodenum    Chronic duodenal ulcer with hemorrhage    ESRD on hemodialysis ([place]) 05/16/2016   Anemia in chronic kidney disease 09/30/2015   Secondary hyperparathyroidism of renal origin ([place]) 04/07/2015   Nephrotic syndrome with focal and segmental glomerular lesions 03/31/2015   HTN (hypertension) 03/20/2015    Past Surgical History:  Procedure Laterality Date   ABDOMINAL SURGERY     CHOLECYSTECTOMY     COLON SURGERY  DIALYSIS/PERMA CATHETER INSERTION N/A 06/30/2016   Procedure: Dialysis/Perma Catheter Insertion;  Surgeon: Algernon Huxley, MD;  Location: [place] [facility];  Service: Cardiovascular;  Laterality: N/A;   ESOPHAGOGASTRODUODENOSCOPY (EGD) WITH PROPOFOL N/A 07/01/2016   Procedure: ESOPHAGOGASTRODUODENOSCOPY (EGD) WITH PROPOFOL;  Surgeon: Lucilla Lame, MD;  Location: [facility] ENDOSCOPY;  Service: Endoscopy;  Laterality: N/A;    ESOPHAGOGASTRODUODENOSCOPY (EGD) WITH PROPOFOL N/A 09/07/2020   Procedure: ESOPHAGOGASTRODUODENOSCOPY (EGD) WITH PROPOFOL;  Surgeon: Lin Landsman, MD;  Location: [facility] ENDOSCOPY;  Service: Gastroenterology;  Laterality: N/A;   LAPAROSCOPIC GASTRIC SLEEVE RESECTION      Prior to Admission medications   Medication Sig Start Date End Date Taking? Authorizing Provider  dicyclomine (BENTYL) 20 MG tablet Take 1 tablet (20 mg total) by mouth every 8 (eight) hours as needed for spasms (Abdominal cramping). 01/21/21  Yes Hadiya Spoerl, Delice Bison, DO  calcium acetate (PHOSLO) 667 MG capsule 667 mg 3 (three) times a day with meals. 01/15/19   [provider]  Cholecalciferol (VITAMIN D PO) Take 1.25 mcg by mouth daily. Takes before dialysis T-Th-S    [provider]  ferrous sulfate 325 (65 FE) MG tablet Take 1 tablet (325 mg total) by mouth 2 (two) times daily with a meal. 07/03/16   Vaughan Basta, MD  lidocaine-prilocaine (EMLA) cream SMARTSIG:Sparingly Topical 03/04/20   [provider]  Methoxy PEG-Epoetin Beta (MIRCERA IJ) Mircera 09/15/20 09/14/21  [provider]  ondansetron (ZOFRAN-ODT) 8 MG disintegrating tablet Take 8 mg by mouth every 8 (eight) hours as needed. 09/01/20   [provider]  pantoprazole (PROTONIX) 40 MG tablet Take 1 tablet (40 mg total) by mouth 2 (two) times daily. 09/07/20   Fritzi Mandes, MD  pregabalin (LYRICA) 50 MG capsule Take 50 mg by mouth daily. 02/09/19   [provider]  rOPINIRole (REQUIP) 0.5 MG tablet Take 0.5 mg by mouth 2 (two) times daily. 02/04/20   [provider]  sucralfate (CARAFATE) 1 g tablet Take 1 g by mouth 4 (four) times daily. 09/03/20   [provider]  sucroferric oxyhydroxide (VELPHORO) 500 MG chewable tablet CRUSH OR CHEW AND SWALLOW 1 TABLET 3 TIMES A DAY WITH MEALS 07/23/20   [provider]  traMADol (ULTRAM) 50 MG tablet Take 50 mg by mouth every 6 (six) hours as needed.  09/03/20   [provider]    Allergies Patient has no known allergies.  Family History  Problem Relation Age of Onset   Renal Disease Maternal Uncle     Social History Social History   Tobacco Use   Smoking status: Never   Smokeless tobacco: Never  Substance Use Topics   Alcohol use: No   Drug use: No    Review of Systems Constitutional: No fever. Eyes: No visual changes. ENT: No sore throat. Cardiovascular: Denies chest pain. Respiratory: Denies shortness of breath. Gastrointestinal: No nausea, vomiting, diarrhea. Genitourinary: Negative for dysuria. Musculoskeletal: Negative for back pain. Skin: Negative for rash. Neurological: Negative for focal weakness or numbness.  ____________________________________________   PHYSICAL EXAM:  VITAL SIGNS: ED Triage Vitals  Enc Vitals Group     BP 01/21/21 0453 (!) 74/53     Pulse Rate 01/21/21 0453 80     Resp 01/21/21 0453 18     Temp 01/21/21 0453 97.6 F (36.4 C)     Temp Source 01/21/21 0453 Oral     SpO2 01/21/21 0453 100 %     Weight 01/21/21 0455 168 lb (76.2 kg)  Height 01/21/21 0455 5\' 7"  (1.702 m)     Head Circumference --      Peak Flow --      Pain Score 01/21/21 0455 7     Pain Loc --      Pain Edu? --      Excl. in [place]? --    CONSTITUTIONAL: Alert and oriented and responds appropriately to questions.  Chronically ill-appearing, afebrile, in no distress HEAD: Normocephalic EYES: Conjunctivae clear, pupils appear equal, EOM appear intact ENT: normal nose; moist mucous membranes NECK: Supple, normal ROM CARD: RRR; S1 and S2 appreciated; no murmurs, no clicks, no rubs, no gallops RESP: Normal chest excursion without splinting or tachypnea; breath sounds clear and equal bilaterally; no wheezes, no rhonchi, no rales, no hypoxia or respiratory distress, speaking full sentences ABD/GI: Normal bowel sounds; non-distended; soft, non-tender, no rebound, no guarding, no peritoneal signs, no  hepatosplenomegaly, multiple scars noted to his abdomen BACK: The back appears normal EXT: Normal ROM in all joints; no deformity noted, no edema; no cyanosis SKIN: Normal color for age and race; warm; no rash on exposed skin NEURO: Moves all extremities equally PSYCH: The patient's mood and manner are appropriate.  ____________________________________________   LABS (all labs ordered are listed, but only abnormal results are displayed)  Labs Reviewed - No data to display ____________________________________________  EKG   ____________________________________________  RADIOLOGY I, Mohd Clemons, personally viewed and evaluated these images (plain radiographs) as part of my medical decision making, as well as reviewing the written report by the radiologist.  ED MD interpretation:    Official radiology report(s): No results found.  ____________________________________________   PROCEDURES  Procedure(s) performed (including Critical Care):  Procedures    ____________________________________________   INITIAL IMPRESSION / ASSESSMENT AND PLAN / ED COURSE  As part of my medical decision making, I reviewed the following data within the [place] notes reviewed and incorporated, Old chart reviewed, Notes from prior ED visits, and [place] Controlled Substance Database         Patient here with complaints of chronic abdominal pain.  He states that he has not had any changes in his pain that has been ongoing for months.  He has an extremely complicated history and has been mostly managed at [facility] but states that he did not want to "drive that far" today.  He is here stating that all he needs is pain medication for his chronic pain.  He states he is able to see his PCP tomorrow suggest needs something for today.  He states he tried to call the triage line for the surgeons at [facility] but was told that they could not refill pain medication over the phone.   He is noted to be hypotensive here but states that this is his baseline and is very adamant about this and refuses further work-up.  It does appear per review of his records at [facility] that he does often have blood pressures in the 91M/38G systolic.  He denies having any fevers, chills, chest pain, shortness of breath, nausea, vomiting, diarrhea, bloody stools, melena, urinary symptoms.  Does appear that he had imaging at [facility] on September 4 that showed no acute abnormality and showed that he had decreasing size of one of his fluid collections so his JP drain was removed at that time.  I have discussed with patient at length that the emergency department is not the appropriate place to receive refills on pain medication for chronic pain especially given  his extremely complex history and most of his care being at [facility].  I have recommended close follow-up with his primary care physician and he states he is going to go there tomorrow.  I have also recommend close follow-up with his general surgeon it appears he has an appointment on 01/28/2021.  I did discuss my concerns with his blood pressure but he again is adamant that this is chronic for him and he has not symptomatic from it.  He states that he just wants a prescription of pain medication and plans to go to dialysis this morning.  I did discuss with him that if he was this hypotensive at dialysis they may not perform his dialysis and may send him back to the ER but he states "no they want to because they know me".  Offered him Tylenol for pain control which he declines.  We discussed trying Bentyl as needed for pain and he is in agreement.  Will discharge with prescription of this medication.  I have again encouraged him to follow-up with his outpatient doctors for management of his chronic symptoms.  Discussed at length return precautions.  Will discharge home.  At this time, I do not feel there is any life-threatening condition present. I have reviewed,  interpreted and discussed all results (EKG, imaging, lab, urine as appropriate) and exam findings with patient/family. I have reviewed nursing notes and appropriate previous records.  I feel the patient is safe to be discharged home without further emergent workup and can continue workup as an outpatient as needed. Discussed usual and customary return precautions. Patient/family verbalize understanding and are comfortable with this plan.  Outpatient follow-up has been provided as needed. All questions have been answered.  ____________________________________________   FINAL CLINICAL IMPRESSION(S) / ED DIAGNOSES  Final diagnoses:  Chronic abdominal pain  Hypotension, unspecified hypotension type     ED Discharge Orders          Ordered    dicyclomine (BENTYL) 20 MG tablet  Every 8 hours PRN        01/21/21 0620            *Please note:  Tristan Bender was evaluated in Emergency Department on 01/21/2021 for the symptoms described in the history of present illness. He was evaluated in the context of the global COVID-19 pandemic, which necessitated consideration that the patient might be at risk for infection with the SARS-CoV-2 virus that causes COVID-19. Institutional protocols and algorithms that pertain to the evaluation of patients at risk for COVID-19 are in a state of rapid change based on information released by regulatory bodies including the CDC and federal and state organizations. These policies and algorithms were followed during the patient's care in the ED.  Some ED evaluations and interventions may be delayed as a result of limited staffing during and the pandemic.*   Note:  This document was prepared using Dragon voice recognition software and may include unintentional dictation errors.    Avila Albritton, Delice Bison, DO 01/21/21 [phone]

## 2021-01-21 NOTE — Discharge Instructions (Addendum)
I recommend that you follow-up with your primary care doctor and surgeon for further management of your chronic abdominal pain.

## 2021-01-21 NOTE — ED Triage Notes (Addendum)
Pt arrives via pov from home. Pt reports he had laparotomy on July 20th. Pt states admitted for 50 days. Pt reports same pain from surgery but has no pain medication at this time. Feeding tube and bile drain present on assessment. Pt states he has to be at dialysis at 0630 this morning. NAD notes at this time. Pt reports taking medication for low BP, and states current BP is his normal for him at this time with reading of 74/53. Denies any symptoms other than pain. Pt states he is just here for pain medications. NAD noted at this time.

## 2021-02-12 DIAGNOSIS — E441 Mild protein-calorie malnutrition: Secondary | ICD-10-CM | POA: Insufficient documentation

## 2021-02-17 DIAGNOSIS — G479 Sleep disorder, unspecified: Secondary | ICD-10-CM | POA: Insufficient documentation

## 2021-02-17 DIAGNOSIS — R200 Anesthesia of skin: Secondary | ICD-10-CM | POA: Insufficient documentation

## 2021-02-17 DIAGNOSIS — R202 Paresthesia of skin: Secondary | ICD-10-CM | POA: Insufficient documentation

## 2021-04-29 DIAGNOSIS — G629 Polyneuropathy, unspecified: Secondary | ICD-10-CM | POA: Insufficient documentation

## 2021-06-17 ENCOUNTER — Encounter: Payer: Self-pay | Admitting: Gastroenterology

## 2021-06-17 ENCOUNTER — Ambulatory Visit (INDEPENDENT_AMBULATORY_CARE_PROVIDER_SITE_OTHER): Payer: Medicare Other | Admitting: Gastroenterology

## 2021-06-17 ENCOUNTER — Other Ambulatory Visit: Payer: Self-pay

## 2021-06-17 VITALS — BP 146/88 | HR 88 | Temp 98.3°F | Ht 67.0 in | Wt 171.4 lb

## 2021-06-17 DIAGNOSIS — Z1211 Encounter for screening for malignant neoplasm of colon: Secondary | ICD-10-CM

## 2021-06-17 DIAGNOSIS — Z6831 Body mass index (BMI) 31.0-31.9, adult: Secondary | ICD-10-CM | POA: Insufficient documentation

## 2021-06-17 DIAGNOSIS — K529 Noninfective gastroenteritis and colitis, unspecified: Secondary | ICD-10-CM

## 2021-06-17 DIAGNOSIS — K2210 Ulcer of esophagus without bleeding: Secondary | ICD-10-CM

## 2021-06-17 DIAGNOSIS — E559 Vitamin D deficiency, unspecified: Secondary | ICD-10-CM | POA: Insufficient documentation

## 2021-06-17 DIAGNOSIS — E538 Deficiency of other specified B group vitamins: Secondary | ICD-10-CM

## 2021-06-17 MED ORDER — FOLIC ACID 1 MG PO TABS: 1.0000 mg | ORAL_TABLET | Freq: Every day | ORAL | 0 refills | Status: DC

## 2021-06-17 MED ORDER — NA SULFATE-K SULFATE-MG SULF 17.5-3.13-1.6 GM/177ML PO SOLN: 354.0000 mL | Freq: Once | ORAL | 0 refills | Status: AC

## 2021-06-17 NOTE — Progress Notes (Signed)
Cephas Darby, MD [address]  [place]  [place], [place] [address]  Main: [phone]  Fax: [phone]    Gastroenterology Consultation  Referring Provider:     Jodi Marble, MD Primary Care Physician:  Jodi Marble, MD Primary Gastroenterologist:  Dr. Cephas Darby Reason for Consultation:   To discuss about screening colonoscopy        HPI:   Tristan Bender is a 61 y.o. male referred by Dr. Jodi Marble, MD  for consultation & management of recent hospitalization for nausea and hematemesis.  He was found to have severe erosive esophagitis as well as large duodenal polyp.  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 , history of hypertension.  Patient does have mild anemia compared to baseline.  Interval summary Patient reports doing well since discharge from the hospital.  He is currently taking Protonix 40 mg daily.  He has appointment with advanced endoscopist, Dr. Ivor Messier, at [facility] on 12/01/2020 to discuss about endoscopic resection of duodenal adenoma.  Follow-up visit 2-23 Patient is here to discuss about screening colonoscopy.  Patient had history of duodenal adenoma, underwent EMR at [facility] on 7/42/5956, complicated by perforation, exploratory laparotomy, gastrojejunostomy with pyloric exclusion and placement of GJ tube and repair of duodenal perforation with omental patch.  Patient had PTC drain placement as well as gastrojejunal tube placement for tube feeds.  The GJ tube and PTC drain have been removed on 02/25/2021.  Patient tells me that he was terrified by the procedure and surgery, prolonged hospitalization.  He is recovering from the surgery.  He is gaining weight.  He is tolerating p.o. well  Today, his main concern is passing  significant amount of flatus, explosive diarrhea as well as significant upper abdominal discomfort which is worse postprandial.  The symptoms have been occurring for last 3 weeks.  Denies any rectal bleeding.  He denies any abdominal bloating  Patient is currently being evaluated for renal transplant.  He wants to discuss about colonoscopy  NSAIDs: None   Antiplts/Anticoagulants/Anti thrombotics: None   GI Procedures: Endoscopy in 2018, large duodenal mass, pathology showed duodenal adenoma Repeat endoscopy at [facility], EMR of the duodenal polyp  Upper endoscopy 09/07/2020 - A single duodenal polyp. Biopsied. - Erythematous mucosa in the stomach. Biopsied. - LA Grade D reflux esophagitis with stigmata of recent bleeding, source of hematemesis. - Salmon-colored mucosa suspicious for long-segment Barrett's esophagus. Biopsied.  DIAGNOSIS:  A.  DUODENAL POLYP, SECOND PORTION; COLD BIOPSY:  - TUBULAR ADENOMA OF DUODENUM, MULTIPLE FRAGMENTS.  - NEGATIVE FOR HIGH-GRADE DYSPLASIA AND MALIGNANCY.   B.  STOMACH; RANDOM COLD BIOPSY:  - ANTRAL AND OXYNTIC MUCOSA WITHOUT PATHOLOGIC CHANGES.  - NEGATIVE FOR H. PYLORI, INTESTINAL METAPLASIA, DYSPLASIA, AND  MALIGNANCY.   C.  ESOPHAGUS, SALMON-COLORED MUCOSA; COLD BIOPSY:  - EROSIVE ESOPHAGITIS INVOLVING SQUAMOUS-LINED MUCOSA.  - NEGATIVE FOR VIRAL CYTOPATHIC EFFECTS, PILL FRAGMENTS, DYSPLASIA, AND  MALIGNANCY.    Past Medical History:  Diagnosis Date   Adenomatous duodenal polyp    jan 2018- [facility]   Anemia    Complication of anesthesia    had to abort previous colonoscopy due to vomiting   GI bleed 06/30/2016   Hypertension    Renal disorder    on dialysis T, TH,SAT   Sleep apnea  uses CPAP    Past Surgical History:  Procedure Laterality Date   ABDOMINAL SURGERY     CHOLECYSTECTOMY     COLON SURGERY     DIALYSIS/PERMA CATHETER INSERTION N/A 06/30/2016   Procedure: Dialysis/Perma Catheter Insertion;  Surgeon: Algernon Huxley, MD;   Location: [facility];  Service: Cardiovascular;  Laterality: N/A;   ESOPHAGOGASTRODUODENOSCOPY (EGD) WITH PROPOFOL N/A 07/01/2016   Procedure: ESOPHAGOGASTRODUODENOSCOPY (EGD) WITH PROPOFOL;  Surgeon: Lucilla Lame, MD;  Location: [facility] ENDOSCOPY;  Service: Endoscopy;  Laterality: N/A;   ESOPHAGOGASTRODUODENOSCOPY (EGD) WITH PROPOFOL N/A 09/07/2020   Procedure: ESOPHAGOGASTRODUODENOSCOPY (EGD) WITH PROPOFOL;  Surgeon: Lin Landsman, MD;  Location: [facility] ENDOSCOPY;  Service: Gastroenterology;  Laterality: N/A;   LAPAROSCOPIC GASTRIC SLEEVE RESECTION      Current Outpatient Medications:    calcium acetate (PHOSLO) 667 MG capsule, 667 mg 3 (three) times a day with meals., Disp: , Rfl:    Cholecalciferol (VITAMIN D PO), Take 1.25 mcg by mouth daily. Takes before dialysis T-Th-S, Disp: , Rfl:    dicyclomine (BENTYL) 20 MG tablet, Take 1 tablet (20 mg total) by mouth every 8 (eight) hours as needed for spasms (Abdominal cramping)., Disp: 15 tablet, Rfl: 0   ferrous sulfate 325 (65 FE) MG tablet, Take 1 tablet (325 mg total) by mouth 2 (two) times daily with a meal., Disp: 60 tablet, Rfl: 3   folic acid (FOLVITE) 1 MG tablet, Take 1 tablet (1 mg total) by mouth daily., Disp: 30 tablet, Rfl: 0   lidocaine-prilocaine (EMLA) cream, SMARTSIG:Sparingly Topical, Disp: , Rfl:    Methoxy PEG-Epoetin Beta (MIRCERA IJ), Mircera, Disp: , Rfl:    Na Sulfate-K Sulfate-Mg Sulf 17.5-3.13-1.6 GM/177ML SOLN, Take 354 mLs by mouth once for 1 dose., Disp: 354 mL, Rfl: 0   ondansetron (ZOFRAN-ODT) 8 MG disintegrating tablet, Take 8 mg by mouth every 8 (eight) hours as needed., Disp: , Rfl:    pantoprazole (PROTONIX) 40 MG tablet, Take 1 tablet (40 mg total) by mouth 2 (two) times daily., Disp: 60 tablet, Rfl: 1   pregabalin (LYRICA) 50 MG capsule, Take 50 mg by mouth daily., Disp: , Rfl:    rOPINIRole (REQUIP) 0.5 MG tablet, Take 0.5 mg by mouth 2 (two) times daily., Disp: , Rfl:    sucralfate (CARAFATE) 1 g  tablet, Take 1 g by mouth 4 (four) times daily., Disp: , Rfl:    sucroferric oxyhydroxide (VELPHORO) 500 MG chewable tablet, CRUSH OR CHEW AND SWALLOW 1 TABLET 3 TIMES A DAY WITH MEALS, Disp: , Rfl:    traMADol (ULTRAM) 50 MG tablet, Take 50 mg by mouth every 6 (six) hours as needed., Disp: , Rfl:    Family History  Problem Relation Age of Onset   Renal Disease Maternal Uncle      Social History   Tobacco Use   Smoking status: Never   Smokeless tobacco: Never  Substance Use Topics   Alcohol use: No   Drug use: No    Allergies as of 06/17/2021 - Review Complete 06/17/2021  Allergen Reaction Noted   Midazolam Other (See Comments) 12/17/2020    Review of Systems:    All systems reviewed and negative except where noted in HPI.   Physical Exam:  BP (!) 146/88 (BP Location: Left Arm, Patient Position: Sitting, Cuff Size: Normal)    Pulse 88    Temp 98.3 F (36.8 C) (Oral)    Ht '5\' 7"'  (1.702 m)    Wt 171 lb 6 oz (77.7 kg)  BMI 26.84 kg/m  No LMP for male patient.  General:   Alert,  Well-developed, well-nourished, pleasant and cooperative in NAD Head:  Normocephalic and atraumatic. Eyes:  Sclera clear, no icterus.   Conjunctiva pink. Ears:  Normal auditory acuity. Nose:  No deformity, discharge, or lesions. Mouth:  No deformity or lesions,oropharynx pink & moist. Neck:  Supple; no masses or thyromegaly. Lungs:  Respirations even and unlabored.  Clear throughout to auscultation.   No wheezes, crackles, or rhonchi. No acute distress. Heart:  Regular rate and rhythm; no murmurs, clicks, rubs, or gallops. Abdomen:  Normal bowel sounds. Soft, non-tender and non-distended without masses, hepatosplenomegaly or hernias noted.  No guarding or rebound tenderness.   Rectal: Not performed Msk:  Symmetrical without gross deformities. Good, equal movement & strength bilaterally. Pulses:  Normal pulses noted. Extremities:  No clubbing or edema.  No cyanosis. Neurologic:  Alert and  oriented x3;  grossly normal neurologically. Skin:  Intact without significant lesions or rashes. No jaundice. Psych:  Alert and cooperative. Normal mood and affect.  Imaging Studies: Reviewed  Assessment and Plan:   Tristan Bender is a 61 y.o. male with history of stage renal disease on hemodialysis, history of duodenal adenoma s/p EMR in 2018 at [facility], history of sleeve gastrectomy in 04/2020, duodenal adenoma, underwent EMR at [facility] on 0/62/6948, complicated by perforation, exploratory laparotomy, gastrojejunostomy with pyloric exclusion and placement of GJ tube and repair of duodenal perforation with omental patch.  Patient had PTC drain placement as well as gastrojejunal tube placement for tube feeds.  The GJ tube and PTC drain have been removed on 02/25/2021.  He also has history of LA grade D esophagitis.  3 weeks history of nonbloody diarrhea with upper abdominal discomfort and flatus  Nonbloody diarrhea with upper abdominal discomfort and gas Differentials include small intestinal bacterial overgrowth given recent surgery or infectious diarrhea or EPI Recommend GI profile PCR to rule out infection.  If this is negative, recommend 2 weeks course of Xifaxan 550 mg twice daily for probable bacterial overgrowth I have also offered upper endoscopy but patient is terrified by the continue recent events and would like to defer at this time  Colon cancer screening Recommend colonoscopy as patient is currently being evaluated for renal transplant  Erosive esophagitis Advised patient to increase Protonix to 40 mg p.o. twice daily Patient does not want to repeat endoscopy at this time  Anemia and folate deficiency Recommend folic acid 1 mg daily for 1 month   Follow up in 4 to 6 months   Cephas Darby, MD

## 2021-06-30 ENCOUNTER — Emergency Department
Admission: EM | Admit: 2021-06-30 | Discharge: 2021-06-30 | Disposition: A | Discharge status: Home / Self Care | Payer: Medicare Other | Attending: Emergency Medicine | Admitting: Emergency Medicine

## 2021-06-30 ENCOUNTER — Other Ambulatory Visit: Payer: Self-pay

## 2021-06-30 ENCOUNTER — Emergency Department: Payer: Medicare Other

## 2021-06-30 DIAGNOSIS — R1011 Right upper quadrant pain: Secondary | ICD-10-CM | POA: Diagnosis present

## 2021-06-30 LAB — CBC
HCT: 33.8 % — ABNORMAL LOW (ref 39.0–52.0)
Hemoglobin: 10.3 g/dL — ABNORMAL LOW (ref 13.0–17.0)
MCH: 26.3 pg (ref 26.0–34.0)
MCHC: 30.5 g/dL (ref 30.0–36.0)
MCV: 86.2 fL (ref 80.0–100.0)
Platelets: 184 10*3/uL (ref 150–400)
RBC: 3.92 MIL/uL — ABNORMAL LOW (ref 4.22–5.81)
RDW: 17 % — ABNORMAL HIGH (ref 11.5–15.5)
WBC: 8.4 10*3/uL (ref 4.0–10.5)
nRBC: 0 % (ref 0.0–0.2)

## 2021-06-30 LAB — COMPREHENSIVE METABOLIC PANEL
ALT: 20 U/L (ref 0–44)
AST: 15 U/L (ref 15–41)
Albumin: 3.2 g/dL — ABNORMAL LOW (ref 3.5–5.0)
Alkaline Phosphatase: 73 U/L (ref 38–126)
Anion gap: 8 (ref 5–15)
BUN: 41 mg/dL — ABNORMAL HIGH (ref 8–23)
CO2: 28 mmol/L (ref 22–32)
Calcium: 7.9 mg/dL — ABNORMAL LOW (ref 8.9–10.3)
Chloride: 102 mmol/L (ref 98–111)
Creatinine, Ser: 5.88 mg/dL — ABNORMAL HIGH (ref 0.61–1.24)
GFR, Estimated: 10 mL/min — ABNORMAL LOW (ref >60)
Glucose, Bld: 91 mg/dL (ref 70–99)
Potassium: 4.4 mmol/L (ref 3.5–5.1)
Sodium: 138 mmol/L (ref 135–145)
Total Bilirubin: 0.6 mg/dL (ref 0.3–1.2)
Total Protein: 6.8 g/dL (ref 6.5–8.1)

## 2021-06-30 LAB — LIPASE, BLOOD: Lipase: 32 U/L (ref 11–51)

## 2021-06-30 MED ORDER — DICYCLOMINE HCL 10 MG PO CAPS
20.0000 mg | ORAL_CAPSULE | Freq: Once | ORAL | Status: AC
  Administered 2021-06-30: 20 mg via ORAL
  Filled 2021-06-30: qty 2

## 2021-06-30 MED ORDER — ALUM & MAG HYDROXIDE-SIMETH 200-200-20 MG/5ML PO SUSP
30.0000 mL | Freq: Once | ORAL | Status: AC
  Administered 2021-06-30: 30 mL via ORAL
  Filled 2021-06-30: qty 30

## 2021-06-30 MED ORDER — LIDOCAINE VISCOUS HCL 2 % MT SOLN
15.0000 mL | Freq: Once | OROMUCOSAL | Status: AC
  Administered 2021-06-30: 15 mL via ORAL
  Filled 2021-06-30: qty 15

## 2021-06-30 NOTE — ED Provider Notes (Signed)
[facility] Provider Note    Event Date/Time   First MD Initiated Contact with Patient 06/30/21 [phone]     (approximate)   History   Abdominal Pain   HPI  Tristan Bender is a 61 y.o. male who presents to the ED for evaluation of Abdominal Pain   I reviewed GI visit from 2/2.  He has a history of ESRD on hemodialysis.  Sleeve gastrectomy, lap chole.  On PPI for esophagitis  Patient presents to the ED for evaluation of 3 weeks of right-sided and upper abdominal pain.  He reports a burning pain that has been constant for weeks with occasional nausea and a couple episodes of postprandial spitting up over these past few weeks.  He reports burning pain now and is requesting medications to help with this.  Reports he cannot sleep at night due to the pain.  He denies fevers, stool changes, shortness of breath, cough.   He reports he is not taking his pantoprazole because he did not think he needed to.  Physical Exam   Triage Vital Signs: ED Triage Vitals  Enc Vitals Group     BP 06/30/21 0431 (!) 138/94     Pulse Rate 06/30/21 0431 89     Resp 06/30/21 0431 18     Temp 06/30/21 0431 99.1 F (37.3 C)     Temp Source 06/30/21 0431 Oral     SpO2 06/30/21 0431 100 %     Weight 06/30/21 0432 165 lb 5.5 oz (75 kg)     Height 06/30/21 0432 5\' 7"  (1.702 m)     Head Circumference --      Peak Flow --      Pain Score 06/30/21 0431 8     Pain Loc --      Pain Edu? --      Excl. in [place]? --     Most recent vital signs: Vitals:   06/30/21 0431 06/30/21 0630  BP: (!) 138/94 124/80  Pulse: 89 78  Resp: 18 17  Temp: 99.1 F (37.3 C)   SpO2: 100% 98%    General: Awake, no distress.  CV:  Good peripheral perfusion.  Resp:  Normal effort.  Abd:  No distention.  Mild tenderness to epigastrium and RUQ.  No peritoneal features or guarding.  Otherwise benign. MSK:  No deformity noted.  Neuro:  No focal deficits appreciated. Other:     ED Results / Procedures  / Treatments   Labs (all labs ordered are listed, but only abnormal results are displayed) Labs Reviewed  COMPREHENSIVE METABOLIC PANEL - Abnormal; Notable for the following components:      Result Value   BUN 41 (*)    Creatinine, Ser 5.88 (*)    Calcium 7.9 (*)    Albumin 3.2 (*)    GFR, Estimated 10 (*)    All other components within normal limits  CBC - Abnormal; Notable for the following components:   RBC 3.92 (*)    Hemoglobin 10.3 (*)    HCT 33.8 (*)    RDW 17.0 (*)    All other components within normal limits  LIPASE, BLOOD  URINALYSIS, ROUTINE W REFLEX MICROSCOPIC    EKG   RADIOLOGY CT abdomen/pelvis reviewed by me without SBO or gastric sleeve malfunction.  Anasarca noted  Official radiology report(s): CT ABDOMEN PELVIS WO CONTRAST  Result Date: 06/30/2021 CLINICAL DATA:  61 year old male with abdominal pain and vomiting. History of cholecystectomy, gastric sleeve. Status post  EGD last year 09/07/2020. EXAM: CT ABDOMEN AND PELVIS WITHOUT CONTRAST TECHNIQUE: Multidetector CT imaging of the abdomen and pelvis was performed following the standard protocol without IV contrast. RADIATION DOSE REDUCTION: This exam was performed according to the departmental dose-optimization program which includes automated exposure control, adjustment of the mA and/or kV according to patient size and/or use of iterative reconstruction technique. COMPARISON:  Noncontrast CT Abdomen and Pelvis 08/26/2020. FINDINGS: Lower chest: Cardiomegaly appears substantially progressed since last year. There is a small pericardial effusion. Superimposed small right and trace left layering pleural fluid is stable from last year. Mild lung base atelectasis or scarring. No lung base pneumonia. Hepatobiliary: Absent gallbladder with small volume pneumobilia now. No convincing portal venous gas. Otherwise negative noncontrast liver. Pancreas: Negative. Spleen: Negative. Adrenals/Urinary Tract: Chronic multi-cystic  renal atrophy does not appear significantly changed from last year. Mild adrenal gland thickening is new. Decompressed bladder. Indistinct bladder wall similar to last year. Stomach/Bowel: No dilated large or small bowel. Generalized mesenteric congestion is new since last year and concordant with similar generalized body wall edema now. Evidence of normal gas containing appendix on coronal image 42. No discrete large or small bowel inflammation is evident. Stable sequelae of gastric sleeve. No free air or free fluid identified. Vascular/Lymphatic: Diffuse severe calcified atherosclerosis. Normal caliber abdominal aorta. Vascular patency is not evaluated in the absence of IV contrast. No lymphadenopathy identified. Reproductive: Small chronic fat containing left inguinal hernia. Other: No pelvic free fluid. 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 Circumscribed lucent areas in the L1 vertebral body are most compatible with benign Schmorl's nodes. Elsewhere Stable visualized osseous structures. Mild to moderate generalized body wall edema is new from last year. IMPRESSION: 1. Cardiomegaly, substantially increased since last year and with a new small pericardial effusion. 2. Associated new Anasarca, with generalized body wall and mesenteric edema. But small right > left pleural effusions have not significantly changed from last year. 3. No evidence of bowel obstruction. No discrete bowel inflammation on this noncontrast exam. 4. Small amount of pneumobilia suspected following cholecystectomy. 5. Chronic renal atrophy. Renal osteodystrophy. Aortic Atherosclerosis (ICD10-I70.0). Electronically Signed   By: Genevie Ann M.D.   On: 06/30/2021 07:19    PROCEDURES and INTERVENTIONS:  Procedures  Medications  alum & mag hydroxide-simeth (MAALOX/MYLANTA) 200-200-20 MG/5ML suspension 30 mL (30 mLs Oral Given 06/30/21 2536)    And  lidocaine (XYLOCAINE) 2 % viscous  mouth solution 15 mL (15 mLs Oral Given 06/30/21 6440)  dicyclomine (BENTYL) capsule 20 mg (20 mg Oral Given 06/30/21 3474)     IMPRESSION / MDM / ASSESSMENT AND PLAN / ED COURSE  I reviewed the triage vital signs and the nursing notes.  61 year old male presents to the ED with subacute abdominal pain in the setting of noncompliance with his PPI, possibly due to his esophagitis, and ultimately suitable for outpatient management.  He looks clinically well, and only has mild tenderness without guarding or peritoneal features.  Otherwise benign examination.  Blood work with stigmata of known ESRD, no pancreatitis and normocytic anemia around baseline.  No symptoms or signs of upper GI bleed.  His symptoms resolved after GI cocktail and Bentyl.  Due to his extensive intra-abdominal surgical history, CT abdomen/pelvis obtained and does not demonstrate evidence of SBO, gastric sleeve malfunction or other intra-abdominal or retroperitoneal pathology.  I urged him to actually take his pantoprazole and follow-up with GI as an outpatient.  Return precautions for the ED discussed and I see  no barriers to outpatient management.  Clinical Course as of 06/30/21 0724  Wed Jun 30, 2021  0656 Reassessed.  Feeling better.  We discussed the importance of adherence to his PPI. [DS]    Clinical Course User Index [DS] Vladimir Crofts, MD     FINAL CLINICAL IMPRESSION(S) / ED DIAGNOSES   Final diagnoses:  Right upper quadrant abdominal pain     Rx / DC Orders   ED Discharge Orders     None        Note:  This document was prepared using Dragon voice recognition software and may include unintentional dictation errors.   Vladimir Crofts, MD 06/30/21 [phone]

## 2021-06-30 NOTE — ED Triage Notes (Addendum)
Pt presents to ER c/o ruq abd pain that has been going on for appx 3-4 weeks. Pt states he came in tonight because he could not take the pain anymore.  Pt has hx of gastric sleeve placement.  Denies any previous SBO.  Pt states he has had n/v/d intermittently for last 3-4 weeks.  Pt A&O x4 at this time in NAD.

## 2021-06-30 NOTE — ED Notes (Signed)
Follow up pcp all info provided all questions answered

## 2021-06-30 NOTE — Discharge Instructions (Signed)
Please take your Protonix/pantoprazole medication.  Take every day, twice daily, this is helpful for acid and acid reflux.  Continue your dialysis schedule.   Follow-up with Dr. Marius Ditch in the GI clinic.  Return to the ED with any worsening symptoms despite this.

## 2021-07-04 LAB — GI PROFILE, STOOL, PCR

## 2021-07-05 ENCOUNTER — Telehealth: Payer: Self-pay | Admitting: Gastroenterology

## 2021-07-05 DIAGNOSIS — K529 Noninfective gastroenteritis and colitis, unspecified: Secondary | ICD-10-CM

## 2021-07-05 MED ORDER — RIFAXIMIN 550 MG PO TABS: 550.0000 mg | ORAL_TABLET | Freq: Three times a day (TID) | ORAL | 0 refills | Status: AC

## 2021-07-05 MED ORDER — DICYCLOMINE HCL 20 MG PO TABS: 20.0000 mg | ORAL_TABLET | Freq: Three times a day (TID) | ORAL | 0 refills | Status: DC

## 2021-07-05 NOTE — Addendum Note (Signed)
Addended by: Ulyess Blossom L on: 07/05/2021 01:50 PM   Modules accepted: Orders

## 2021-07-05 NOTE — Telephone Encounter (Signed)
Patient verbalized understanding he will come pick up stool test. He is still having the diarrhea so called in Xifaxan and dicyclomine to the pharmacy

## 2021-07-05 NOTE — Telephone Encounter (Signed)
I see dicyclomine on his list, he can try 20mg  TID as needed -20 pills Also, given that his stool studies came back negative for infection, please send in prescription for rifaximin 550 mg 3 times daily for 2 weeks if patient is still having diarrhea and is agreeable Recommend to check pancreatic fecal elastase levels  RV

## 2021-07-05 NOTE — Telephone Encounter (Signed)
Pt is wanting some advice and possible pain meds for abdominal pain.

## 2021-07-05 NOTE — Telephone Encounter (Signed)
Patient is having right upper quadrant pain. He states he went to the ER on 06/30/21 for the pain. He has a colonoscopy schedule for 07/08/21 and states the pain is keeping him up at night. He wants to know if we can prescribed him oxycodone. Informed patient we do not prescribed that kind of pain medicine. He states he has not tried the Dicyclomine before.

## 2021-07-06 ENCOUNTER — Encounter: Payer: Self-pay | Admitting: Gastroenterology

## 2021-07-08 ENCOUNTER — Ambulatory Visit: Payer: Medicare Other | Admitting: Anesthesiology

## 2021-07-08 ENCOUNTER — Ambulatory Visit
Admission: RE | Admit: 2021-07-08 | Discharge: 2021-07-08 | Disposition: A | Discharge status: Home / Self Care | Payer: Medicare Other | Attending: Gastroenterology | Admitting: Gastroenterology

## 2021-07-08 ENCOUNTER — Encounter: Payer: Self-pay | Admitting: Gastroenterology

## 2021-07-08 ENCOUNTER — Other Ambulatory Visit: Payer: Self-pay

## 2021-07-08 ENCOUNTER — Encounter
Admission: RE | Disposition: A | Discharge status: Home / Self Care | Payer: Self-pay | Source: Home / Self Care | Attending: Gastroenterology

## 2021-07-08 ENCOUNTER — Other Ambulatory Visit
Admission: RE | Admit: 2021-07-08 | Discharge: 2021-07-08 | Disposition: A | Discharge status: Home / Self Care | Payer: Medicare Other | Attending: Anesthesiology | Admitting: Anesthesiology

## 2021-07-08 DIAGNOSIS — Q438 Other specified congenital malformations of intestine: Secondary | ICD-10-CM | POA: Insufficient documentation

## 2021-07-08 DIAGNOSIS — G4730 Sleep apnea, unspecified: Secondary | ICD-10-CM | POA: Insufficient documentation

## 2021-07-08 DIAGNOSIS — Z1211 Encounter for screening for malignant neoplasm of colon: Secondary | ICD-10-CM | POA: Diagnosis present

## 2021-07-08 DIAGNOSIS — K648 Other hemorrhoids: Secondary | ICD-10-CM | POA: Diagnosis not present

## 2021-07-08 DIAGNOSIS — I120 Hypertensive chronic kidney disease with stage 5 chronic kidney disease or end stage renal disease: Secondary | ICD-10-CM | POA: Diagnosis not present

## 2021-07-08 DIAGNOSIS — Z8711 Personal history of peptic ulcer disease: Secondary | ICD-10-CM | POA: Insufficient documentation

## 2021-07-08 DIAGNOSIS — Z992 Dependence on renal dialysis: Secondary | ICD-10-CM | POA: Insufficient documentation

## 2021-07-08 DIAGNOSIS — N186 End stage renal disease: Secondary | ICD-10-CM | POA: Insufficient documentation

## 2021-07-08 HISTORY — PX: COLONOSCOPY WITH PROPOFOL: SHX5780

## 2021-07-08 LAB — POTASSIUM: Potassium: 4 mmol/L (ref 3.5–5.1)

## 2021-07-08 SURGERY — COLONOSCOPY WITH PROPOFOL
Anesthesia: General | Site: Rectum

## 2021-07-08 MED ORDER — SODIUM CHLORIDE 0.9 % IV SOLN: INTRAVENOUS | Status: DC

## 2021-07-08 MED ORDER — LIDOCAINE HCL (CARDIAC) PF 100 MG/5ML IV SOSY
PREFILLED_SYRINGE | INTRAVENOUS | Status: DC | PRN
  Administered 2021-07-08: 30 mg via INTRAVENOUS

## 2021-07-08 MED ORDER — LACTATED RINGERS IV SOLN: INTRAVENOUS | Status: DC

## 2021-07-08 MED ORDER — ACETAMINOPHEN 325 MG PO TABS: 325.0000 mg | ORAL_TABLET | Freq: Once | ORAL | Status: DC

## 2021-07-08 MED ORDER — ACETAMINOPHEN 160 MG/5ML PO SOLN: 325.0000 mg | Freq: Once | ORAL | Status: DC

## 2021-07-08 MED ORDER — STERILE WATER FOR IRRIGATION IR SOLN
Status: DC | PRN
Start: 2021-07-08 — End: 2021-07-08
  Administered 2021-07-08: 250 mL

## 2021-07-08 MED ORDER — STERILE WATER FOR IRRIGATION IR SOLN
Status: DC | PRN
  Administered 2021-07-08: 120 mL

## 2021-07-08 MED ORDER — PROPOFOL 10 MG/ML IV BOLUS
INTRAVENOUS | Status: DC | PRN
  Administered 2021-07-08 (×4): 20 mg via INTRAVENOUS
  Administered 2021-07-08: 50 mg via INTRAVENOUS
  Administered 2021-07-08 (×8): 20 mg via INTRAVENOUS
  Administered 2021-07-08: 40 mg via INTRAVENOUS
  Administered 2021-07-08 (×2): 20 mg via INTRAVENOUS
  Administered 2021-07-08: 150 mg via INTRAVENOUS

## 2021-07-08 SURGICAL SUPPLY — 6 items
GOWN CVR UNV OPN BCK APRN NK (MISCELLANEOUS) ×2 IMPLANT
GOWN ISOL THUMB LOOP REG UNIV (MISCELLANEOUS) ×4
KIT PRC NS LF DISP ENDO (KITS) ×1 IMPLANT
KIT PROCEDURE OLYMPUS (KITS) ×2
MANIFOLD NEPTUNE II (INSTRUMENTS) ×2 IMPLANT
WATER STERILE IRR 250ML POUR (IV SOLUTION) ×2 IMPLANT

## 2021-07-08 NOTE — Anesthesia Procedure Notes (Signed)
Date/Time: 07/08/2021 9:44 AM Performed by: Cameron Ali, CRNA Pre-anesthesia Checklist: Patient identified, Emergency Drugs available, Suction available, Timeout performed and Patient being monitored Patient Re-evaluated:Patient Re-evaluated prior to induction Oxygen Delivery Method: Nasal cannula Placement Confirmation: positive ETCO2

## 2021-07-08 NOTE — Anesthesia Postprocedure Evaluation (Signed)
Anesthesia Post Note  Patient: Tristan Bender  Procedure(s) Performed: COLONOSCOPY WITH PROPOFOL (Rectum)     Patient location during evaluation: PACU Anesthesia Type: General Level of consciousness: awake and alert and oriented Pain management: satisfactory to patient Vital Signs Assessment: post-procedure vital signs reviewed and stable Respiratory status: spontaneous breathing, nonlabored ventilation and respiratory function stable Cardiovascular status: blood pressure returned to baseline and stable Postop Assessment: Adequate PO intake and No signs of nausea or vomiting Anesthetic complications: no   No notable events documented.  Raliegh Ip

## 2021-07-08 NOTE — Transfer of Care (Signed)
Immediate Anesthesia Transfer of Care Note  Patient: Tristan Bender  Procedure(s) Performed: COLONOSCOPY WITH PROPOFOL (Rectum)  Patient Location: PACU  Anesthesia Type: General  Level of Consciousness: awake, alert  and patient cooperative  Airway and Oxygen Therapy: Patient Spontanous Breathing and Patient connected to supplemental oxygen  Post-op Assessment: Post-op Vital signs reviewed, Patient's Cardiovascular Status Stable, Respiratory Function Stable, Patent Airway and No signs of Nausea or vomiting  Post-op Vital Signs: Reviewed and stable  Complications: No notable events documented.

## 2021-07-08 NOTE — Op Note (Signed)
[facility] Gastroenterology Patient Name: Tristan Bender Procedure Date: 07/08/2021 8:59 AM MRN: [id number] Account #: [id number] Date of Birth: [birth year] Admit Type: Outpatient Age: 61 Room: [facility] OR ROOM 01 Gender: Male Note Status: Finalized Instrument Name: [id number] Procedure:             Colonoscopy Indications:           Screening for colorectal malignant neoplasm, This is                         the patient's first colonoscopy Providers:             Lin Landsman MD, MD Referring MD:          Venetia Maxon. Elijio Miles, MD (Referring MD) Medicines:             General Anesthesia Complications:         No immediate complications. Estimated blood loss: None. Procedure:             Pre-Anesthesia Assessment:                        - Prior to the procedure, a History and Physical was                         performed, and patient medications and allergies were                         reviewed. The patient is competent. The risks and                         benefits of the procedure and the sedation options and                         risks were discussed with the patient. All questions                         were answered and informed consent was obtained.                         Patient identification and proposed procedure were                         verified by the physician, the nurse, the                         anesthesiologist, the anesthetist and the technician                         in the pre-procedure area in the procedure room in the                         endoscopy suite. Mental Status Examination: alert and                         oriented. Airway Examination: normal oropharyngeal                         airway and neck mobility. Respiratory Examination:  clear to auscultation. CV Examination: normal.                         Prophylactic Antibiotics: The patient does not require                         prophylactic  antibiotics. Prior Anticoagulants: The                         patient has taken no previous anticoagulant or                         antiplatelet agents. ASA Grade Assessment: III - A                         patient with severe systemic disease. After reviewing                         the risks and benefits, the patient was deemed in                         satisfactory condition to undergo the procedure. The                         anesthesia plan was to use general anesthesia.                         Immediately prior to administration of medications,                         the patient was re-assessed for adequacy to receive                         sedatives. The heart rate, respiratory rate, oxygen                         saturations, blood pressure, adequacy of pulmonary                         ventilation, and response to care were monitored                         throughout the procedure. The physical status of the                         patient was re-assessed after the procedure.                        After obtaining informed consent, the colonoscope was                         passed under direct vision. Throughout the procedure,                         the patient's blood pressure, pulse, and oxygen                         saturations were monitored continuously. The  Colonoscope was introduced through the anus and                         advanced to the the cecum, identified by appendiceal                         orifice and ileocecal valve. The colonoscopy was                         extremely difficult due to significant looping.                         Successful completion of the procedure was aided by                         changing the patient to a supine position and applying                         abdominal pressure. The patient tolerated the                         procedure well. The quality of the bowel preparation                          was evaluated using the BBPS [facility] Bowel Preparation                         Scale) with scores of: Right Colon = 3, Transverse                         Colon = 3 and Left Colon = 3 (entire mucosa seen well                         with no residual staining, small fragments of stool or                         opaque liquid). The total BBPS score equals 9. Findings:      The perianal and digital rectal examinations were normal. Pertinent       negatives include normal sphincter tone and no palpable rectal lesions.      The entire examined colon appeared normal.      Non-bleeding internal hemorrhoids were found during retroflexion. The       hemorrhoids were medium-sized. Impression:            - The entire examined colon is normal.                        - Non-bleeding internal hemorrhoids.                        - No specimens collected. Recommendation:        - Discharge patient to home (with escort).                        - Resume previous diet today.                        - Continue present  medications.                        - Repeat colonoscopy in 10 years for screening                         purposes. Procedure Code(s):     --- Professional ---                        A4536, Colorectal cancer screening; colonoscopy on                         individual not meeting criteria for high risk Diagnosis Code(s):     --- Professional ---                        Z12.11, Encounter for screening for malignant neoplasm                         of colon                        K64.8, Other hemorrhoids CPT copyright 2019 American Medical Association. All rights reserved. The codes documented in this report are preliminary and upon coder review may  be revised to meet current compliance requirements. Dr. Ulyess Mort Lin Landsman MD, MD 07/08/2021 9:52:25 AM This report has been signed electronically. Number of Addenda: 0 Note Initiated On: 07/08/2021 8:59 AM Scope Withdrawal Time: 0  hours 12 minutes 47 seconds  Total Procedure Duration: 0 hours 36 minutes 7 seconds  Estimated Blood Loss:  Estimated blood loss: none.      [facility]

## 2021-07-08 NOTE — Anesthesia Preprocedure Evaluation (Signed)
Anesthesia Evaluation  Patient identified by MRN, date of birth, ID band Patient awake    Reviewed: Allergy & Precautions, H&P , NPO status , Patient's Chart, lab work & pertinent test results  Airway Mallampati: II  TM Distance: >3 FB Neck ROM: full    Dental no notable dental hx.    Pulmonary sleep apnea ,    Pulmonary exam normal breath sounds clear to auscultation       Cardiovascular hypertension, Normal cardiovascular exam Rhythm:regular Rate:Normal     Neuro/Psych    GI/Hepatic PUD,   Endo/Other    Renal/GU Dialysis and ESRFRenal disease     Musculoskeletal   Abdominal   Peds  Hematology   Anesthesia Other Findings   Reproductive/Obstetrics                             Anesthesia Physical Anesthesia Plan  ASA: 3  Anesthesia Plan: General   Post-op Pain Management: Minimal or no pain anticipated   Induction: Intravenous  PONV Risk Score and Plan: 2 and Treatment may vary due to age or medical condition, TIVA and Propofol infusion  Airway Management Planned: Natural Airway  Additional Equipment:   Intra-op Plan:   Post-operative Plan:   Informed Consent: I have reviewed the patients History and Physical, chart, labs and discussed the procedure including the risks, benefits and alternatives for the proposed anesthesia with the patient or authorized representative who has indicated his/her understanding and acceptance.     Dental Advisory Given  Plan Discussed with: CRNA  Anesthesia Plan Comments:         Anesthesia Quick Evaluation

## 2021-07-08 NOTE — H&P (Signed)
Tristan Darby, MD [address]  [place]  [place], [place] [address]  Main: [phone]  Fax: [phone] Pager: [phone]  Primary Care Physician:  Jodi Marble, MD Primary Gastroenterologist:  Dr. Cephas Bender  Pre-Procedure History & Physical: HPI:  Tristan Bender is a 61 y.o. male is here for an colonoscopy.   Past Medical History:  Diagnosis Date   Adenomatous duodenal polyp    jan 2018- [facility]   Anemia    Bile leak 12/45/8099   Biliary colic 83/38/2505   Chronic duodenal ulcer with hemorrhage    Complication of anesthesia    had to abort previous colonoscopy due to vomiting   Duodenal perforation ([place]) 01/05/2021   GI bleed 06/30/2016   Hematemesis with nausea    Hemoptysis 09/06/2020   Hypertension    Polyp of intestine 09/09/2016   Formatting of this note might be different from the original. 4 cm sessile duodenal polyp   Renal disorder    on dialysis T, TH,SAT   RUQ abdominal pain 08/26/2020   Sleep apnea    uses CPAP (sometimes)    Past Surgical History:  Procedure Laterality Date   ABDOMINAL SURGERY     CHOLECYSTECTOMY     COLON SURGERY     DIALYSIS/PERMA CATHETER INSERTION N/A 06/30/2016   Procedure: Dialysis/Perma Catheter Insertion;  Surgeon: Algernon Huxley, MD;  Location: [place] [facility];  Service: Cardiovascular;  Laterality: N/A;   ESOPHAGOGASTRODUODENOSCOPY (EGD) WITH PROPOFOL N/A 07/01/2016   Procedure: ESOPHAGOGASTRODUODENOSCOPY (EGD) WITH PROPOFOL;  Surgeon: Lucilla Lame, MD;  Location: [facility] ENDOSCOPY;  Service: Endoscopy;  Laterality: N/A;   ESOPHAGOGASTRODUODENOSCOPY (EGD) WITH PROPOFOL N/A 09/07/2020   Procedure: ESOPHAGOGASTRODUODENOSCOPY (EGD) WITH PROPOFOL;  Surgeon: Lin Landsman, MD;  Location: [facility] ENDOSCOPY;  Service: Gastroenterology;  Laterality: N/A;   LAPAROSCOPIC GASTRIC SLEEVE RESECTION      Prior to Admission medications   Medication Sig Start Date End Date Taking? Authorizing Provider  Cholecalciferol  (VITAMIN D PO) Take 1.25 mcg by mouth daily. Takes before dialysis T-Th-S   Yes [provider]  dicyclomine (BENTYL) 20 MG tablet Take 1 tablet (20 mg total) by mouth 3 (three) times daily before meals. 07/05/21  Yes Serenitee Fuertes, Tally Due, MD  folic acid (FOLVITE) 1 MG tablet Take 1 tablet (1 mg total) by mouth daily. 06/17/21 07/17/21 Yes Jaymeson Mengel, Tally Due, MD  ondansetron (ZOFRAN-ODT) 8 MG disintegrating tablet Take 8 mg by mouth every 8 (eight) hours as needed. 09/01/20  Yes [provider]  pantoprazole (PROTONIX) 40 MG tablet Take 1 tablet (40 mg total) by mouth 2 (two) times daily. 09/07/20  Yes Fritzi Mandes, MD  pregabalin (LYRICA) 50 MG capsule Take 50 mg by mouth daily. 02/09/19  Yes [provider]  rifaximin (XIFAXAN) 550 MG TABS tablet Take 1 tablet (550 mg total) by mouth 3 (three) times daily for 14 days. 07/05/21 07/19/21 Yes Sarabella Caprio, Tally Due, MD  sucralfate (CARAFATE) 1 g tablet Take 1 g by mouth 4 (four) times daily. 09/03/20  Yes [provider]  sucroferric oxyhydroxide (VELPHORO) 500 MG chewable tablet CRUSH OR CHEW AND SWALLOW 1 TABLET 3 TIMES A DAY WITH MEALS 07/23/20  Yes [provider]  traMADol (ULTRAM) 50 MG tablet Take 50 mg by mouth every 6 (six) hours as needed. 09/03/20  Yes [provider]  ferrous sulfate 325 (65 FE) MG tablet Take 1 tablet (325 mg total) by mouth 2 (two) times daily with a meal. 07/03/16   Anselm Jungling,  Rosalio Macadamia, MD  lidocaine-prilocaine (EMLA) cream SMARTSIG:Sparingly Topical 03/04/20   [provider]  Methoxy PEG-Epoetin Beta (MIRCERA IJ) Mircera Patient not taking: Reported on 07/08/2021 09/15/20 09/14/21  [provider]    Allergies as of 06/17/2021 - Review Complete 06/17/2021  Allergen Reaction Noted   Midazolam Other (See Comments) 12/17/2020    Family History  Problem Relation Age of Onset   Renal Disease Maternal Uncle     Social History   Socioeconomic History   Marital  status: Divorced    Spouse name: Not on file   Number of children: Not on file   Years of education: Not on file   Highest education level: Not on file  Occupational History   Not on file  Tobacco Use   Smoking status: Never   Smokeless tobacco: Never  Vaping Use   Vaping Use: Never used  Substance and Sexual Activity   Alcohol use: No   Drug use: No   Sexual activity: Yes  Other Topics Concern   Not on file  Social History Narrative   Not on file   Social Determinants of Health   Financial Resource Strain: Not on file  Food Insecurity: Not on file  Transportation Needs: Not on file  Physical Activity: Not on file  Stress: Not on file  Social Connections: Not on file  Intimate Partner Violence: Not on file    Review of Systems: See HPI, otherwise negative ROS  Physical Exam: Pulse 70    Temp 97.7 F (36.5 C) (Temporal)    Ht 5\' 7"  (1.702 m)    Wt 74.8 kg    BMI 25.84 kg/m  General:   Alert,  pleasant and cooperative in NAD Head:  Normocephalic and atraumatic. Neck:  Supple; no masses or thyromegaly. Lungs:  Clear throughout to auscultation.    Heart:  Regular rate and rhythm. Abdomen:  Soft, nontender and nondistended. Normal bowel sounds, without guarding, and without rebound.   Neurologic:  Alert and  oriented x4;  grossly normal neurologically.  Impression/Plan: Tristan Bender is here for an colonoscopy to be performed for colon cancer screening  Risks, benefits, limitations, and alternatives regarding  colonoscopy have been reviewed with the patient.  Questions have been answered.  All parties agreeable.   Sherri Sear, MD  07/08/2021, 8:30 AM

## 2021-07-09 ENCOUNTER — Encounter: Payer: Self-pay | Admitting: Gastroenterology

## 2021-07-21 ENCOUNTER — Other Ambulatory Visit: Payer: Self-pay | Admitting: Gastroenterology

## 2021-07-21 DIAGNOSIS — E538 Deficiency of other specified B group vitamins: Secondary | ICD-10-CM

## 2021-08-08 ENCOUNTER — Other Ambulatory Visit: Payer: Self-pay | Admitting: Gastroenterology

## 2021-09-02 ENCOUNTER — Other Ambulatory Visit: Payer: Self-pay

## 2021-09-02 ENCOUNTER — Ambulatory Visit (INDEPENDENT_AMBULATORY_CARE_PROVIDER_SITE_OTHER): Payer: Medicare Other | Admitting: Gastroenterology

## 2021-09-02 ENCOUNTER — Encounter: Payer: Self-pay | Admitting: Gastroenterology

## 2021-09-02 VITALS — BP 145/93 | HR 75 | Temp 98.0°F | Ht 67.0 in | Wt 171.2 lb

## 2021-09-02 DIAGNOSIS — D132 Benign neoplasm of duodenum: Secondary | ICD-10-CM

## 2021-09-02 DIAGNOSIS — K2210 Ulcer of esophagus without bleeding: Secondary | ICD-10-CM | POA: Diagnosis not present

## 2021-09-02 NOTE — Progress Notes (Signed)
?  ?Cephas Darby, MD ?[address]  ?[address]  ?[place], [place] [address]  ?Main: [phone]  ?Fax: [phone] ? ? ? ?Gastroenterology Consultation ? ?Referring Provider:     Jodi Marble, MD ?Primary Care Physician:  Jodi Marble, MD ?Primary Gastroenterologist:  Dr. Cephas Darby ?Reason for Consultation:   To discuss about surveillance upper endoscopy ?      ? HPI:   ?Brently Voorhis is a 61 y.o. male referred by Dr. Jodi Marble, MD  for consultation & management of history of duodenal adenoma, underwent EMR at [facility] on 9/38/1017, complicated by perforation, exploratory laparotomy, gastrojejunostomy with pyloric exclusion and placement of GJ tube and repair of duodenal perforation with omental patch.   ?Patient is currently being evaluated for renal transplant.  Patient is here to discuss about surveillance upper endoscopy.  Patient had a telemedicine visit with Dr. Ivor Messier, advanced endoscopist at [facility], patient needs a surveillance upper endoscopy for follow-up of duodenal adenoma with focal high-grade dysplasia.  Patient would like to undergo upper endoscopy by me. ?Patient has history of erosive esophagitis, currently taking Protonix 40 mg p.o. twice daily ? ?NSAIDs: None ?  ?Antiplts/Anticoagulants/Anti thrombotics: None ?  ?GI Procedures: Endoscopy in 2018, large duodenal mass, pathology showed duodenal adenoma ?Repeat endoscopy at [facility], EMR of the duodenal polyp ? ?Upper endoscopy 09/07/2020 ?- A single duodenal polyp. Biopsied. ?- Erythematous mucosa in the stomach. Biopsied. ?- LA Grade D reflux esophagitis with stigmata of recent bleeding, source of hematemesis. ?- Salmon-colored mucosa suspicious for long-segment Barrett's esophagus. Biopsied. ? ?DIAGNOSIS:  ?A.  DUODENAL POLYP, SECOND PORTION; COLD BIOPSY:  ?- TUBULAR ADENOMA OF DUODENUM, MULTIPLE FRAGMENTS.  ?- NEGATIVE FOR HIGH-GRADE DYSPLASIA AND MALIGNANCY.  ? ?B.  STOMACH; RANDOM COLD BIOPSY:  ?- ANTRAL AND OXYNTIC  MUCOSA WITHOUT PATHOLOGIC CHANGES.  ?- NEGATIVE FOR H. PYLORI, INTESTINAL METAPLASIA, DYSPLASIA, AND  ?MALIGNANCY.  ? ?C.  ESOPHAGUS, SALMON-COLORED MUCOSA; COLD BIOPSY:  ?- EROSIVE ESOPHAGITIS INVOLVING SQUAMOUS-LINED MUCOSA.  ?- NEGATIVE FOR VIRAL CYTOPATHIC EFFECTS, PILL FRAGMENTS, DYSPLASIA, AND  ?MALIGNANCY.  ? ? ?Past Medical History:  ?Diagnosis Date  ? Adenomatous duodenal polyp   ? jan 2018- [facility]  ? Anemia   ? Bile leak 01/05/2021  ? Biliary colic 51/06/5850  ? Chronic duodenal ulcer with hemorrhage   ? Complication of anesthesia   ? had to abort previous colonoscopy due to vomiting  ? Duodenal perforation ([place]) 01/05/2021  ? GI bleed 06/30/2016  ? Hematemesis with nausea   ? Hemoptysis 09/06/2020  ? Hypertension   ? Polyp of intestine 09/09/2016  ? Formatting of this note might be different from the original. 4 cm sessile duodenal polyp  ? Renal disorder   ? on dialysis T, TH,SAT  ? RUQ abdominal pain 08/26/2020  ? Sleep apnea   ? uses CPAP (sometimes)  ? ? ?Past Surgical History:  ?Procedure Laterality Date  ? ABDOMINAL SURGERY    ? CHOLECYSTECTOMY    ? COLON SURGERY    ? COLONOSCOPY WITH PROPOFOL N/A 07/08/2021  ? Procedure: COLONOSCOPY WITH PROPOFOL;  Surgeon: Lin Landsman, MD;  Location: [facility];  Service: Endoscopy;  Laterality: N/A;  sleep apnea ?needs potassium draw at arrival  ? DIALYSIS/PERMA CATHETER INSERTION N/A 06/30/2016  ? Procedure: Dialysis/Perma Catheter Insertion;  Surgeon: Algernon Huxley, MD;  Location: [place] [facility];  Service: Cardiovascular;  Laterality: N/A;  ? ESOPHAGOGASTRODUODENOSCOPY (EGD) WITH PROPOFOL N/A 07/01/2016  ? Procedure: ESOPHAGOGASTRODUODENOSCOPY (EGD) WITH  PROPOFOL;  Surgeon: Lucilla Lame, MD;  Location: [facility] ENDOSCOPY;  Service: Endoscopy;  Laterality: N/A;  ? ESOPHAGOGASTRODUODENOSCOPY (EGD) WITH PROPOFOL N/A 09/07/2020  ? Procedure: ESOPHAGOGASTRODUODENOSCOPY (EGD) WITH PROPOFOL;  Surgeon: Lin Landsman, MD;  Location: [facility] ENDOSCOPY;   Service: Gastroenterology;  Laterality: N/A;  ? LAPAROSCOPIC GASTRIC SLEEVE RESECTION    ? ? ?Current Outpatient Medications:  ?  B Complex-C-Folic Acid (RENA-VITE RX) 1 MG TABS, Take 1 tablet by mouth daily., Disp: , Rfl:  ?  Cholecalciferol (VITAMIN D PO), Take 1.25 mcg by mouth daily. Takes before dialysis T-Th-S, Disp: , Rfl:  ?  folic acid (FOLVITE) 1 MG tablet, TAKE 1 TABLET(1 MG) BY MOUTH DAILY, Disp: 30 tablet, Rfl: 0 ?  lidocaine-prilocaine (EMLA) cream, SMARTSIG:Sparingly Topical, Disp: , Rfl:  ?  ondansetron (ZOFRAN-ODT) 4 MG disintegrating tablet, Take by mouth daily., Disp: , Rfl:  ?  pantoprazole (PROTONIX) 40 MG tablet, 1 tablet Orally Once a day for 30 day(s), Disp: , Rfl:  ?  pregabalin (LYRICA) 25 MG capsule, Take by mouth., Disp: , Rfl:  ?  traMADol (ULTRAM) 50 MG tablet, Take by mouth., Disp: , Rfl:  ?  sucroferric oxyhydroxide (VELPHORO) 500 MG chewable tablet, CRUSH OR CHEW AND SWALLOW 1 TABLET 3 TIMES A DAY WITH MEALS (Patient not taking: Reported on 09/02/2021), Disp: , Rfl:  ? ? ?Family History  ?Problem Relation Age of Onset  ? Renal Disease Maternal Uncle   ?  ? ?Social History  ? ?Tobacco Use  ? Smoking status: Never  ? Smokeless tobacco: Never  ?Vaping Use  ? Vaping Use: Never used  ?Substance Use Topics  ? Alcohol use: No  ? Drug use: No  ? ? ?Allergies as of 09/02/2021 - Review Complete 09/02/2021  ?Allergen Reaction Noted  ? Midazolam Other (See Comments) 12/17/2020  ? ? ?Review of Systems:    ?All systems reviewed and negative except where noted in HPI. ? ? Physical Exam:  ?BP (!) 145/93 (BP Location: Left Arm, Patient Position: Sitting, Cuff Size: Normal)   Pulse 75   Temp 98 ?F (36.7 ?C) (Oral)   Ht '5\' 7"'$  (1.702 m)   Wt 171 lb 4 oz (77.7 kg)   BMI 26.82 kg/m?  ?No LMP for male patient. ? ?General:   Alert,  Well-developed, well-nourished, pleasant and cooperative in NAD ?Head:  Normocephalic and atraumatic. ?Eyes:  Sclera clear, no icterus.   Conjunctiva pink. ?Ears:  Normal  auditory acuity. ?Nose:  No deformity, discharge, or lesions. ?Mouth:  No deformity or lesions,oropharynx pink & moist. ?Neck:  Supple; no masses or thyromegaly. ?Lungs:  Respirations even and unlabored.  Clear throughout to auscultation.   No wheezes, crackles, or rhonchi. No acute distress. ?Heart:  Regular rate and rhythm; no murmurs, clicks, rubs, or gallops. ?Abdomen:  Normal bowel sounds. Soft, non-tender and non-distended without masses, hepatosplenomegaly or hernias noted.  No guarding or rebound tenderness.   ?Rectal: Not performed ?Msk:  Symmetrical without gross deformities. Good, equal movement & strength bilaterally. ?Pulses:  Normal pulses noted. ?Extremities:  No clubbing or edema.  No cyanosis. ?Neurologic:  Alert and oriented x3;  grossly normal neurologically. ?Skin:  Intact without significant lesions or rashes. No jaundice. ?Psych:  Alert and cooperative. Normal mood and affect. ? ?Imaging Studies: ?Reviewed ? ?Assessment and Plan:  ? ?Casmir Auguste is a 61 y.o. male with history of end stage renal disease on hemodialysis, history of duodenal adenoma s/p EMR in 2018 at [facility], history of sleeve gastrectomy in  04/2020, duodenal adenoma, underwent EMR at [facility] on 11/09/9483, complicated by perforation, exploratory laparotomy, repair of duodenal perforation with omental patch. He also has history of LA grade D esophagitis.  Patient is here to discuss about surveillance upper endoscopy for history of duodenal adenoma with focal high-grade dysplasia ? ?Proceed with diagnostic upper endoscopy ?If the EGD shows any recurrence of duodenal polyp, depending on the size of the polyp, I have explained to the patient that I may or may not be able to resect the polyp which means he will need repeat upper endoscopy with an advanced endoscopist at a tertiary care center and he is agreeable ? ?I have discussed alternative options, risks & benefits,  which include, but are not limited to, bleeding, infection,  perforation,respiratory complication & drug reaction.  The patient agrees with this plan & written consent will be obtained.   ? ? ?Erosive esophagitis ?Continue Protonix to 40 mg p.o. twice daily ? ? ?Follow up as needed ? ?R

## 2021-09-06 ENCOUNTER — Encounter: Payer: Self-pay | Admitting: Gastroenterology

## 2021-09-09 ENCOUNTER — Encounter
Admission: RE | Disposition: A | Discharge status: Home / Self Care | Payer: Self-pay | Source: Home / Self Care | Attending: Gastroenterology

## 2021-09-09 ENCOUNTER — Ambulatory Visit: Payer: Medicare Other | Admitting: Anesthesiology

## 2021-09-09 ENCOUNTER — Other Ambulatory Visit: Payer: Self-pay

## 2021-09-09 ENCOUNTER — Telehealth: Payer: Self-pay

## 2021-09-09 ENCOUNTER — Other Ambulatory Visit: Payer: Self-pay | Admitting: Family

## 2021-09-09 ENCOUNTER — Encounter: Payer: Self-pay | Admitting: Gastroenterology

## 2021-09-09 ENCOUNTER — Ambulatory Visit
Admission: RE | Admit: 2021-09-09 | Discharge: 2021-09-09 | Disposition: A | Discharge status: Home / Self Care | Payer: Medicare Other | Attending: Gastroenterology | Admitting: Gastroenterology

## 2021-09-09 DIAGNOSIS — N186 End stage renal disease: Secondary | ICD-10-CM | POA: Diagnosis not present

## 2021-09-09 DIAGNOSIS — K2210 Ulcer of esophagus without bleeding: Secondary | ICD-10-CM | POA: Diagnosis not present

## 2021-09-09 DIAGNOSIS — Z09 Encounter for follow-up examination after completed treatment for conditions other than malignant neoplasm: Secondary | ICD-10-CM | POA: Insufficient documentation

## 2021-09-09 DIAGNOSIS — G4730 Sleep apnea, unspecified: Secondary | ICD-10-CM | POA: Insufficient documentation

## 2021-09-09 DIAGNOSIS — Z980 Intestinal bypass and anastomosis status: Secondary | ICD-10-CM | POA: Insufficient documentation

## 2021-09-09 DIAGNOSIS — D132 Benign neoplasm of duodenum: Secondary | ICD-10-CM | POA: Diagnosis not present

## 2021-09-09 DIAGNOSIS — Z992 Dependence on renal dialysis: Secondary | ICD-10-CM | POA: Insufficient documentation

## 2021-09-09 DIAGNOSIS — Z9884 Bariatric surgery status: Secondary | ICD-10-CM | POA: Diagnosis not present

## 2021-09-09 DIAGNOSIS — R9389 Abnormal findings on diagnostic imaging of other specified body structures: Secondary | ICD-10-CM

## 2021-09-09 DIAGNOSIS — I120 Hypertensive chronic kidney disease with stage 5 chronic kidney disease or end stage renal disease: Secondary | ICD-10-CM | POA: Insufficient documentation

## 2021-09-09 HISTORY — PX: ESOPHAGOGASTRODUODENOSCOPY (EGD) WITH PROPOFOL: SHX5813

## 2021-09-09 SURGERY — ESOPHAGOGASTRODUODENOSCOPY (EGD) WITH PROPOFOL
Anesthesia: General | Site: Mouth

## 2021-09-09 MED ORDER — LACTATED RINGERS IV SOLN: INTRAVENOUS | Status: DC

## 2021-09-09 MED ORDER — PHENYLEPHRINE HCL (PRESSORS) 10 MG/ML IV SOLN
INTRAVENOUS | Status: DC | PRN
  Administered 2021-09-09 (×3): 50 ug via INTRAVENOUS

## 2021-09-09 MED ORDER — GLYCOPYRROLATE 0.2 MG/ML IJ SOLN
INTRAMUSCULAR | Status: DC | PRN
  Administered 2021-09-09: .1 mg via INTRAVENOUS

## 2021-09-09 MED ORDER — SIMETHICONE 40 MG/0.6ML PO SUSP
ORAL | Status: DC | PRN
  Administered 2021-09-09: 60 mL

## 2021-09-09 MED ORDER — SODIUM CHLORIDE 0.9 % IV SOLN: INTRAVENOUS | Status: DC

## 2021-09-09 MED ORDER — OMEPRAZOLE 40 MG PO CPDR: 40.0000 mg | DELAYED_RELEASE_CAPSULE | Freq: Two times a day (BID) | ORAL | 3 refills | Status: AC

## 2021-09-09 MED ORDER — STERILE WATER FOR IRRIGATION IR SOLN
Status: DC | PRN
  Administered 2021-09-09: 100 mL

## 2021-09-09 MED ORDER — PROPOFOL 10 MG/ML IV BOLUS
INTRAVENOUS | Status: DC | PRN
  Administered 2021-09-09 (×2): 30 mg via INTRAVENOUS
  Administered 2021-09-09: 100 mg via INTRAVENOUS
  Administered 2021-09-09: 30 mg via INTRAVENOUS

## 2021-09-09 MED ORDER — LIDOCAINE HCL (CARDIAC) PF 100 MG/5ML IV SOSY
PREFILLED_SYRINGE | INTRAVENOUS | Status: DC | PRN
  Administered 2021-09-09: 30 mg via INTRAVENOUS

## 2021-09-09 SURGICAL SUPPLY — 34 items
BALLN DILATOR 10-12 8 (BALLOONS)
BALLN DILATOR 12-15 8 (BALLOONS)
BALLN DILATOR 15-18 8 (BALLOONS)
BALLN DILATOR CRE 0-12 8 (BALLOONS)
BALLN DILATOR ESOPH 8 10 CRE (MISCELLANEOUS) IMPLANT
BALLOON DILATOR 12-15 8 (BALLOONS) IMPLANT
BALLOON DILATOR 15-18 8 (BALLOONS) IMPLANT
BALLOON DILATOR CRE 0-12 8 (BALLOONS) IMPLANT
BLOCK BITE 60FR ADLT L/F GRN (MISCELLANEOUS) ×2 IMPLANT
CLIP HMST 235XBRD CATH ROT (MISCELLANEOUS) IMPLANT
CLIP RESOLUTION 360 11X235 (MISCELLANEOUS)
ELECT REM PT RETURN 9FT ADLT (ELECTROSURGICAL)
ELECTRODE REM PT RTRN 9FT ADLT (ELECTROSURGICAL) IMPLANT
FCP ESCP3.2XJMB 240X2.8X (MISCELLANEOUS)
FORCEPS BIOP RAD 4 LRG CAP 4 (CUTTING FORCEPS) IMPLANT
FORCEPS BIOP RJ4 240 W/NDL (MISCELLANEOUS)
FORCEPS ESCP3.2XJMB 240X2.8X (MISCELLANEOUS) IMPLANT
GOWN CVR UNV OPN BCK APRN NK (MISCELLANEOUS) ×2 IMPLANT
GOWN ISOL THUMB LOOP REG UNIV (MISCELLANEOUS) ×4
INJECTOR VARIJECT VIN23 (MISCELLANEOUS) IMPLANT
KIT DEFENDO VALVE AND CONN (KITS) IMPLANT
KIT PRC NS LF DISP ENDO (KITS) ×1 IMPLANT
KIT PROCEDURE OLYMPUS (KITS) ×2
MANIFOLD NEPTUNE II (INSTRUMENTS) ×2 IMPLANT
MARKER SPOT ENDO TATTOO 5ML (MISCELLANEOUS) IMPLANT
RETRIEVER NET PLAT FOOD (MISCELLANEOUS) IMPLANT
SNARE SHORT THROW 13M SML OVAL (MISCELLANEOUS) IMPLANT
SNARE SHORT THROW 30M LRG OVAL (MISCELLANEOUS) IMPLANT
SPOT EX ENDOSCOPIC TATTOO (MISCELLANEOUS)
SYR INFLATION 60ML (SYRINGE) IMPLANT
TRAP ETRAP POLY (MISCELLANEOUS) IMPLANT
VARIJECT INJECTOR VIN23 (MISCELLANEOUS)
WATER STERILE IRR 250ML POUR (IV SOLUTION) ×2 IMPLANT
WIRE CRE 18-20MM 8CM F G (MISCELLANEOUS) IMPLANT

## 2021-09-09 NOTE — Anesthesia Postprocedure Evaluation (Signed)
Anesthesia Post Note ? ?Patient: Tristan Bender ? ?Procedure(s) Performed: ESOPHAGOGASTRODUODENOSCOPY (EGD) WITH PROPOFOL (Mouth) ? ? ?  ?Patient location during evaluation: PACU ?Anesthesia Type: General ?Level of consciousness: awake and alert and oriented ?Pain management: satisfactory to patient ?Vital Signs Assessment: post-procedure vital signs reviewed and stable ?Respiratory status: spontaneous breathing, nonlabored ventilation and respiratory function stable ?Cardiovascular status: blood pressure returned to baseline and stable ?Postop Assessment: Adequate PO intake and No signs of nausea or vomiting ?Anesthetic complications: no ? ? ?No notable events documented. ? ?Raliegh Ip ? ? ? ? ? ?

## 2021-09-09 NOTE — Anesthesia Preprocedure Evaluation (Signed)
Anesthesia Evaluation  ?Patient identified by MRN, date of birth, ID band ?Patient awake ? ? ? ?Reviewed: ?Allergy & Precautions, H&P , NPO status , Patient's Chart, lab work & pertinent test results ? ?Airway ?Mallampati: II ? ?TM Distance: >3 FB ?Neck ROM: full ? ? ? Dental ?no notable dental hx. ? ?  ?Pulmonary ?sleep apnea ,  ?  ?Pulmonary exam normal ?breath sounds clear to auscultation ? ? ? ? ? ? Cardiovascular ?hypertension, Normal cardiovascular exam ?Rhythm:regular Rate:Normal ? ? ?  ?Neuro/Psych ?  ? GI/Hepatic ?PUD,   ?Endo/Other  ? ? Renal/GU ?Dialysis and ESRFRenal disease  ? ?  ?Musculoskeletal ? ? Abdominal ?  ?Peds ? Hematology ?  ?Anesthesia Other Findings ? ? Reproductive/Obstetrics ? ?  ? ? ? ? ? ? ? ? ? ? ? ? ? ?  ?  ? ? ? ? ? ? ? ? ?Anesthesia Physical ? ?Anesthesia Plan ? ?ASA: 3 ? ?Anesthesia Plan: General  ? ?Post-op Pain Management: Minimal or no pain anticipated  ? ?Induction: Intravenous ? ?PONV Risk Score and Plan: 2 and Treatment may vary due to age or medical condition, TIVA and Propofol infusion ? ?Airway Management Planned: Natural Airway ? ?Additional Equipment:  ? ?Intra-op Plan:  ? ?Post-operative Plan:  ? ?Informed Consent: I have reviewed the patients History and Physical, chart, labs and discussed the procedure including the risks, benefits and alternatives for the proposed anesthesia with the patient or authorized representative who has indicated his/her understanding and acceptance.  ? ? ? ?Dental Advisory Given ? ?Plan Discussed with: CRNA ? ?Anesthesia Plan Comments:   ? ? ? ? ? ? ?Anesthesia Quick Evaluation ? ?

## 2021-09-09 NOTE — Telephone Encounter (Signed)
Has follow up appointment 10/18/21 will schedule at appointment  ?

## 2021-09-09 NOTE — Transfer of Care (Signed)
Immediate Anesthesia Transfer of Care Note ? ?Patient: Tristan Bender ? ?Procedure(s) Performed: ESOPHAGOGASTRODUODENOSCOPY (EGD) WITH PROPOFOL (Mouth) ? ?Patient Location: PACU ? ?Anesthesia Type: General ? ?Level of Consciousness: awake, alert  and patient cooperative ? ?Airway and Oxygen Therapy: Patient Spontanous Breathing and Patient connected to supplemental oxygen ? ?Post-op Assessment: Post-op Vital signs reviewed, Patient's Cardiovascular Status Stable, Respiratory Function Stable, Patent Airway and No signs of Nausea or vomiting ? ?Post-op Vital Signs: Reviewed and stable ? ?Complications: No notable events documented. ? ?

## 2021-09-09 NOTE — Anesthesia Procedure Notes (Signed)
Date/Time: 09/09/2021 9:07 AM ?Performed by: Cameron Ali, CRNA ?Pre-anesthesia Checklist: Patient identified, Emergency Drugs available, Suction available, Timeout performed and Patient being monitored ?Patient Re-evaluated:Patient Re-evaluated prior to induction ?Oxygen Delivery Method: Nasal cannula ?Placement Confirmation: positive ETCO2 ? ? ? ? ?

## 2021-09-09 NOTE — H&P (Signed)
?Cephas Darby, MD ?[address]  ?[address]  ?[place], [place] [address]  ?Main: [phone]  ?Fax: [phone] ?Pager: [phone] ? ?Primary Care Physician:  Jodi Marble, MD ?Primary Gastroenterologist:  Dr. Cephas Darby ? ?Pre-Procedure History & Physical: ?HPI:  Tristan Bender is a 61 y.o. male is here for an endoscopy. ?  ?Past Medical History:  ?Diagnosis Date  ? Adenomatous duodenal polyp   ? jan 2018- [facility]  ? Anemia   ? Bile leak 01/05/2021  ? Biliary colic 65/68/1275  ? Chronic duodenal ulcer with hemorrhage   ? Complication of anesthesia   ? had to abort previous colonoscopy due to vomiting  ? Duodenal perforation ([place]) 01/05/2021  ? GI bleed 06/30/2016  ? Hematemesis with nausea   ? Hemoptysis 09/06/2020  ? Hypertension   ? Polyp of intestine 09/09/2016  ? Formatting of this note might be different from the original. 4 cm sessile duodenal polyp  ? Renal disorder   ? on dialysis T, TH,SAT  ? RUQ abdominal pain 08/26/2020  ? Sleep apnea   ? uses CPAP (sometimes)  ? ? ?Past Surgical History:  ?Procedure Laterality Date  ? ABDOMINAL SURGERY    ? CHOLECYSTECTOMY    ? COLON SURGERY    ? COLONOSCOPY WITH PROPOFOL N/A 07/08/2021  ? Procedure: COLONOSCOPY WITH PROPOFOL;  Surgeon: Lin Landsman, MD;  Location: [place];  Service: Endoscopy;  Laterality: N/A;  sleep apnea ?needs potassium draw at arrival  ? DIALYSIS/PERMA CATHETER INSERTION N/A 06/30/2016  ? Procedure: Dialysis/Perma Catheter Insertion;  Surgeon: Algernon Huxley, MD;  Location: [place] [facility];  Service: Cardiovascular;  Laterality: N/A;  ? ESOPHAGOGASTRODUODENOSCOPY (EGD) WITH PROPOFOL N/A 07/01/2016  ? Procedure: ESOPHAGOGASTRODUODENOSCOPY (EGD) WITH PROPOFOL;  Surgeon: Lucilla Lame, MD;  Location: [facility] ENDOSCOPY;  Service: Endoscopy;  Laterality: N/A;  ? ESOPHAGOGASTRODUODENOSCOPY (EGD) WITH PROPOFOL N/A 09/07/2020  ? Procedure: ESOPHAGOGASTRODUODENOSCOPY (EGD) WITH PROPOFOL;  Surgeon: Lin Landsman, MD;   Location: [facility] ENDOSCOPY;  Service: Gastroenterology;  Laterality: N/A;  ? LAPAROSCOPIC GASTRIC SLEEVE RESECTION    ? ? ?Prior to Admission medications   ?Medication Sig Start Date End Date Taking? Authorizing Provider  ?B Complex-C-Folic Acid (RENA-VITE RX) 1 MG TABS Take 1 tablet by mouth daily.   Yes [provider]  ?Cholecalciferol (VITAMIN D PO) Take 1.25 mcg by mouth daily. Takes before dialysis T-Th-S   Yes [provider]  ?folic acid (FOLVITE) 1 MG tablet TAKE 1 TABLET(1 MG) BY MOUTH DAILY 07/21/21  Yes Shima Compere, Tally Due, MD  ?ondansetron (ZOFRAN-ODT) 4 MG disintegrating tablet Take by mouth daily. 08/22/21  Yes [provider]  ?pantoprazole (PROTONIX) 40 MG tablet 1 tablet Orally Once a day for 30 day(s)   Yes [provider]  ?pregabalin (LYRICA) 25 MG capsule Take by mouth. 08/17/21  Yes [provider]  ?traMADol (ULTRAM) 50 MG tablet Take by mouth. 08/27/21  Yes [provider]  ?lidocaine-prilocaine (EMLA) cream SMARTSIG:Sparingly Topical 03/04/20   [provider]  ?sucroferric oxyhydroxide (VELPHORO) 500 MG chewable tablet CRUSH OR CHEW AND SWALLOW 1 TABLET 3 TIMES A DAY WITH MEALS ?Patient not taking: Reported on 09/02/2021 07/23/20   [provider]  ? ? ?Allergies as of 09/02/2021 - Review Complete 09/02/2021  ?Allergen Reaction Noted  ? Midazolam Other (See Comments) 12/17/2020  ? ? ?Family History  ?Problem Relation Age of Onset  ? Renal Disease Maternal Uncle   ? ? ?Social History  ? ?Socioeconomic History  ?  Marital status: Divorced  ?  Spouse name: Not on file  ? Number of children: Not on file  ? Years of education: Not on file  ? Highest education level: Not on file  ?Occupational History  ? Not on file  ?Tobacco Use  ? Smoking status: Never  ? Smokeless tobacco: Never  ?Vaping Use  ? Vaping Use: Never used  ?Substance and Sexual Activity  ? Alcohol use: No  ? Drug use: No  ? Sexual activity: Yes  ?Other Topics Concern  ?  Not on file  ?Social History Narrative  ? Not on file  ? ?Social Determinants of Health  ? ?Financial Resource Strain: Not on file  ?Food Insecurity: Not on file  ?Transportation Needs: Not on file  ?Physical Activity: Not on file  ?Stress: Not on file  ?Social Connections: Not on file  ?Intimate Partner Violence: Not on file  ? ? ?Review of Systems: ?See HPI, otherwise negative ROS ? ?Physical Exam: ?BP 120/82   Pulse 79   Temp (!) 97.3 ?F (36.3 ?C) (Temporal)   Ht '5\' 7"'$  (1.702 m)   Wt 81 kg   SpO2 97%   BMI 27.96 kg/m?  ?General:   Alert,  pleasant and cooperative in NAD ?Head:  Normocephalic and atraumatic. ?Neck:  Supple; no masses or thyromegaly. ?Lungs:  Clear throughout to auscultation.    ?Heart:  Regular rate and rhythm. ?Abdomen:  Soft, nontender and nondistended. Normal bowel sounds, without guarding, and without rebound.   ?Neurologic:  Alert and  oriented x4;  grossly normal neurologically. ? ?Impression/Plan: ?Tristan Bender is here for an endoscopy to be performed for h/o duodenal adenoma ? ?Risks, benefits, limitations, and alternatives regarding  endoscopy have been reviewed with the patient.  Questions have been answered.  All parties agreeable. ? ? ?Sherri Sear, MD  09/09/2021, 8:56 AM ?

## 2021-09-09 NOTE — Telephone Encounter (Signed)
-----   Message from Lin Landsman, MD sent at 09/09/2021  9:46 AM EDT ----- ?Regarding: Repeat EGD ?He will need repeat EGD in 55month ?Dx: esophageal ulcer and clinic follow up in 440month? ?RV ? ?

## 2021-09-09 NOTE — Op Note (Signed)
[facility] ?Gastroenterology ?Patient Name: Tristan Bender ?Procedure Date: 09/09/2021 8:57 AM ?MRN: [id number] ?Account #: [id number] ?Date of Birth: 11-Mar-1961 ?Admit Type: Outpatient ?Age: 61 ?Room: [facility] OR ROOM 01 ?Gender: Male ?Note Status: Finalized ?Instrument Name: [id number] ?Procedure:             Upper GI endoscopy ?Indications:           Surveillance procedure, , Follow-up of esophagitis,  ?                       h/o duodenal adenoma s/p resection ?Providers:             Lin Landsman MD, MD ?Referring MD:          Venetia Maxon. Elijio Miles, MD (Referring MD) ?Medicines:             General Anesthesia ?Complications:         No immediate complications. Estimated blood loss: None. ?Procedure:             Pre-Anesthesia Assessment: ?                       - Prior to the procedure, a History and Physical was  ?                       performed, and patient medications and allergies were  ?                       reviewed. The patient is competent. The risks and  ?                       benefits of the procedure and the sedation options and  ?                       risks were discussed with the patient. All questions  ?                       were answered and informed consent was obtained.  ?                       Patient identification and proposed procedure were  ?                       verified by the physician, the nurse, the  ?                       anesthesiologist, the anesthetist and the technician  ?                       in the pre-procedure area in the procedure room in the  ?                       endoscopy suite. Mental Status Examination: alert and  ?                       oriented. Airway Examination: normal oropharyngeal  ?                       airway and neck mobility. Respiratory Examination:  ?  clear to auscultation. CV Examination: normal.  ?                       Prophylactic Antibiotics: The patient does not require  ?                       prophylactic  antibiotics. Prior Anticoagulants: The  ?                       patient has taken no previous anticoagulant or  ?                       antiplatelet agents. ASA Grade Assessment: III - A  ?                       patient with severe systemic disease. After reviewing  ?                       the risks and benefits, the patient was deemed in  ?                       satisfactory condition to undergo the procedure. The  ?                       anesthesia plan was to use general anesthesia.  ?                       Immediately prior to administration of medications,  ?                       the patient was re-assessed for adequacy to receive  ?                       sedatives. The heart rate, respiratory rate, oxygen  ?                       saturations, blood pressure, adequacy of pulmonary  ?                       ventilation, and response to care were monitored  ?                       throughout the procedure. The physical status of the  ?                       patient was re-assessed after the procedure. ?                       After obtaining informed consent, the endoscope was  ?                       passed under direct vision. Throughout the procedure,  ?                       the patient's blood pressure, pulse, and oxygen  ?                       saturations were monitored continuously. The Endoscope  ?  was introduced through the mouth, and advanced to the  ?                       afferent and efferent jejunal loops. The upper GI  ?                       endoscopy was accomplished without difficulty. The  ?                       patient tolerated the procedure well. ?Findings: ?     Evidence of a patent Billroth II gastrojejunostomy was found. The  ?     gastrojejunal anastomosis was characterized by erythema, friable mucosa,  ?     inflammation and an intact staple line. This was traversed. The efferent  ?     limb was examined, normal. The afferent limb was examined, normal,  ?      contained bile, traversed to 20cm. ?     The gastric pouch was normal ?     One superficial esophageal ulcer with no bleeding and no stigmata of  ?     recent bleeding was found in the GE junstion and lower third of the  ?     esophagus. The lesion was 15 mm in largest dimension. ?Impression:            - Patent Billroth II gastrojejunostomy was found,  ?                       characterized by erythema, friable mucosa,  ?                       inflammation and an intact staple line. ?                       - Esophageal ulcer with no bleeding and no stigmata of  ?                       recent bleeding. ?                       - No specimens collected. ?Recommendation:        - Discharge patient to home (with escort). ?                       - Resume previous diet today. ?                       - Continue present medications. ?                       - Follow an antireflux regimen for the rest of the  ?                       patient's life. ?                       - Use Prilosec (omeprazole) 40 mg PO BID indefinitely. ?                       - Repeat upper endoscopy in 3 months to check healing  ?  and use pediatric colonoscope to examine the afferent  ?                       jejunal loop to the duodenum ?Procedure Code(s):     --- Professional --- ?                       [phone], Esophagogastroduodenoscopy, flexible,  ?                       transoral; diagnostic, including collection of  ?                       specimen(s) by brushing or washing, when performed  ?                       (separate procedure) ?CPT copyright 2019 American Medical Association. All rights reserved. ?The codes documented in this report are preliminary and upon coder review may  ?be revised to meet current compliance requirements. ?Dr. Ulyess Mort ?Devani Odonnel Raeanne Gathers MD, MD ?09/09/2021 9:39:26 AM ?This report has been signed electronically. ?Number of Addenda: 0 ?Note Initiated On: 09/09/2021 8:57 AM ?Total Procedure  Duration: 0 hours 12 minutes 42 seconds  ?Estimated Blood Loss:  Estimated blood loss: none. Estimated blood loss:  ?                       none. Estimated blood loss: none. ?     [facility] ?

## 2021-09-10 ENCOUNTER — Encounter: Payer: Self-pay | Admitting: Gastroenterology

## 2021-09-17 ENCOUNTER — Ambulatory Visit
Admission: RE | Admit: 2021-09-17 | Discharge: 2021-09-17 | Disposition: A | Discharge status: Home / Self Care | Payer: Medicare Other | Source: Ambulatory Visit | Attending: Family | Admitting: Family

## 2021-09-17 DIAGNOSIS — R9389 Abnormal findings on diagnostic imaging of other specified body structures: Secondary | ICD-10-CM

## 2021-09-17 MED ORDER — GADOBUTROL 1 MMOL/ML IV SOLN: 7.5000 mL | Freq: Once | INTRAVENOUS | Status: DC | PRN

## 2021-10-13 ENCOUNTER — Telehealth: Payer: Self-pay | Admitting: Gastroenterology

## 2021-10-13 NOTE — Telephone Encounter (Signed)
Patient called sating that he wants a second opinion. Wants to see Dr Allen Norris rather than Dr Marius Ditch.

## 2021-10-14 NOTE — Telephone Encounter (Signed)
Please cancel appointment with Dr. Marius Ditch and make appointment with Dr. Allen Norris

## 2021-10-14 NOTE — Telephone Encounter (Signed)
Left message on voicemail letting pt know that Dr Allen Norris is ok with the Tx and needed to move appt

## 2021-10-18 ENCOUNTER — Other Ambulatory Visit: Payer: Self-pay

## 2021-10-18 ENCOUNTER — Encounter: Payer: Self-pay | Admitting: Gastroenterology

## 2021-10-18 ENCOUNTER — Ambulatory Visit (INDEPENDENT_AMBULATORY_CARE_PROVIDER_SITE_OTHER): Payer: Medicare Other | Admitting: Gastroenterology

## 2021-10-18 VITALS — BP 125/86 | HR 84 | Temp 98.2°F | Ht 67.0 in | Wt 177.5 lb

## 2021-10-18 DIAGNOSIS — R1011 Right upper quadrant pain: Secondary | ICD-10-CM

## 2021-10-18 DIAGNOSIS — G8929 Other chronic pain: Secondary | ICD-10-CM | POA: Diagnosis not present

## 2021-10-18 DIAGNOSIS — K529 Noninfective gastroenteritis and colitis, unspecified: Secondary | ICD-10-CM

## 2021-10-18 MED ORDER — LIDOCAINE VISCOUS HCL 2 % MT SOLN: 15.0000 mL | Freq: Four times a day (QID) | OROMUCOSAL | 1 refills | Status: DC | PRN

## 2021-10-18 NOTE — Progress Notes (Signed)
Cephas Darby, MD [address]  [place]  [place], [place] [address]  Main: [phone]  Fax: [phone]    Gastroenterology Consultation  Referring Provider:     Jodi Marble, MD Primary Care Physician:  Jodi Marble, MD Primary Gastroenterologist:  Dr. Cephas Darby Reason for Consultation:   Right lateral subcostal tenderness        HPI:   Tristan Bender is a 61 y.o. male referred by Dr. Jodi Marble, MD  for consultation & management of history of duodenal adenoma, underwent EMR at [facility] on 10/20/3708, complicated by perforation, exploratory laparotomy, gastrojejunostomy with pyloric exclusion and placement of GJ tube and repair of duodenal perforation with omental patch.  Patient underwent upper endoscopy on 09/09/2021 which revealed large clean-based esophageal ulcer.  I recommended patient to start taking omeprazole 40 mg p.o. twice daily.  However, patient has not started taking PPI.  Today, his main concern is chronic diarrhea, nonbloody as well as chronic right lateral subcostal tenderness.  Patient reports that he has been experiencing diarrhea for almost 6 months, without any rectal bleeding, bowel movements anywhere from 5 to 6/day, loose, not associated with abdominal pain or bloating.  Usually postprandial.  Denies any nocturnal diarrhea or incontinence.  He had GI profile PCR in 06/2021 which was unremarkable.  Patient reports ongoing right lateral subcostal pain which is more or less constant and occurring on a daily basis.  Patient reports that when he was in the ER in February, he was given viscous lidocaine which helped with the pain.  Patient is no longer able to receive tramadol.  Patient underwent CT abdomen and pelvis which was unrevealing.  Patient is frustrated with ongoing pain.  His weight has been stable.  Patient also tells me that he has transferred his transplant evaluation to [facility] because [facility] transplant team recommended him  to undergo upper endoscopy at [facility] itself in order to evaluate the duodenal polyp before undergoing transplant because of the risk of reoccurring precancerous polyp if he undergoes transplant and receives immunosuppression.   NSAIDs: None   Antiplts/Anticoagulants/Anti thrombotics: None   GI Procedures: Endoscopy in 2018, large duodenal mass, pathology showed duodenal adenoma Repeat endoscopy at [facility], EMR of the duodenal polyp  Upper endoscopy 09/07/2020 - A single duodenal polyp. Biopsied. - Erythematous mucosa in the stomach. Biopsied. - LA Grade D reflux esophagitis with stigmata of recent bleeding, source of hematemesis. - Salmon-colored mucosa suspicious for long-segment Barrett's esophagus. Biopsied.  DIAGNOSIS:  A.  DUODENAL POLYP, SECOND PORTION; COLD BIOPSY:  - TUBULAR ADENOMA OF DUODENUM, MULTIPLE FRAGMENTS.  - NEGATIVE FOR HIGH-GRADE DYSPLASIA AND MALIGNANCY.   B.  STOMACH; RANDOM COLD BIOPSY:  - ANTRAL AND OXYNTIC MUCOSA WITHOUT PATHOLOGIC CHANGES.  - NEGATIVE FOR H. PYLORI, INTESTINAL METAPLASIA, DYSPLASIA, AND  MALIGNANCY.   C.  ESOPHAGUS, SALMON-COLORED MUCOSA; COLD BIOPSY:  - EROSIVE ESOPHAGITIS INVOLVING SQUAMOUS-LINED MUCOSA.  - NEGATIVE FOR VIRAL CYTOPATHIC EFFECTS, PILL FRAGMENTS, DYSPLASIA, AND  MALIGNANCY.   Upper endoscopy 09/09/2021 - Patent Billroth II gastrojejunostomy was found, characterized by erythema, friable mucosa, inflammation and an intact staple line. - Esophageal ulcer with no bleeding and no stigmata of recent bleeding. - No specimens collected.  Past Medical History:  Diagnosis Date   Adenomatous duodenal polyp    jan 2018- [facility]   Anemia    Bile leak 62/69/4854   Biliary colic 62/70/3500   Chronic duodenal ulcer with hemorrhage    Complication of anesthesia  had to abort previous colonoscopy due to vomiting   Duodenal perforation ([place]) 01/05/2021   GI bleed 06/30/2016   Hematemesis with nausea    Hemoptysis 09/06/2020    Hypertension    Polyp of intestine 09/09/2016   Formatting of this note might be different from the original. 4 cm sessile duodenal polyp   Renal disorder    on dialysis T, TH,SAT   RUQ abdominal pain 08/26/2020   Sleep apnea    uses CPAP (sometimes)    Past Surgical History:  Procedure Laterality Date   ABDOMINAL SURGERY     CHOLECYSTECTOMY     COLON SURGERY     COLONOSCOPY WITH PROPOFOL N/A 07/08/2021   Procedure: COLONOSCOPY WITH PROPOFOL;  Surgeon: Lin Landsman, MD;  Location: [place];  Service: Endoscopy;  Laterality: N/A;  sleep apnea needs potassium draw at arrival   DIALYSIS/PERMA CATHETER INSERTION N/A 06/30/2016   Procedure: Dialysis/Perma Catheter Insertion;  Surgeon: Algernon Huxley, MD;  Location: [place] [facility];  Service: Cardiovascular;  Laterality: N/A;   ESOPHAGOGASTRODUODENOSCOPY (EGD) WITH PROPOFOL N/A 07/01/2016   Procedure: ESOPHAGOGASTRODUODENOSCOPY (EGD) WITH PROPOFOL;  Surgeon: Lucilla Lame, MD;  Location: [facility] ENDOSCOPY;  Service: Endoscopy;  Laterality: N/A;   ESOPHAGOGASTRODUODENOSCOPY (EGD) WITH PROPOFOL N/A 09/07/2020   Procedure: ESOPHAGOGASTRODUODENOSCOPY (EGD) WITH PROPOFOL;  Surgeon: Lin Landsman, MD;  Location: [facility] ENDOSCOPY;  Service: Gastroenterology;  Laterality: N/A;   ESOPHAGOGASTRODUODENOSCOPY (EGD) WITH PROPOFOL N/A 09/09/2021   Procedure: ESOPHAGOGASTRODUODENOSCOPY (EGD) WITH PROPOFOL;  Surgeon: Lin Landsman, MD;  Location: [place];  Service: Endoscopy;  Laterality: N/A;   LAPAROSCOPIC GASTRIC SLEEVE RESECTION      Current Outpatient Medications:    B Complex-C-Folic Acid (RENA-VITE RX) 1 MG TABS, Take 1 tablet by mouth daily., Disp: , Rfl:    Cholecalciferol (VITAMIN D PO), Take 1.25 mcg by mouth daily. Takes before dialysis T-Th-S, Disp: , Rfl:    folic acid (FOLVITE) 1 MG tablet, TAKE 1 TABLET(1 MG) BY MOUTH DAILY, Disp: 30 tablet, Rfl: 0   lidocaine (XYLOCAINE) 2 % solution, Use as directed 15 mLs  in the mouth or throat every 6 (six) hours as needed for mouth pain., Disp: 100 mL, Rfl: 1   omeprazole (PRILOSEC) 40 MG capsule, Take 1 capsule (40 mg total) by mouth 2 (two) times daily before a meal., Disp: 60 capsule, Rfl: 3   ondansetron (ZOFRAN-ODT) 4 MG disintegrating tablet, Take by mouth daily., Disp: , Rfl:    Oxycodone HCl 10 MG TABS, Take 10 mg by mouth daily., Disp: , Rfl:    pregabalin (LYRICA) 75 MG capsule, Take 75 mg by mouth 2 (two) times daily., Disp: , Rfl:    sucroferric oxyhydroxide (VELPHORO) 500 MG chewable tablet, CRUSH OR CHEW AND SWALLOW 1 TABLET 3 TIMES A DAY WITH MEALS (Patient not taking: Reported on 09/02/2021), Disp: , Rfl:    Family History  Problem Relation Age of Onset   Renal Disease Maternal Uncle      Social History   Tobacco Use   Smoking status: Never   Smokeless tobacco: Never  Vaping Use   Vaping Use: Never used  Substance Use Topics   Alcohol use: No   Drug use: No    Allergies as of 10/18/2021 - Review Complete 10/18/2021  Allergen Reaction Noted   Midazolam Other (See Comments) 12/17/2020    Review of Systems:    All systems reviewed and negative except where noted in HPI.   Physical Exam:  BP 125/86 (  BP Location: Left Arm, Patient Position: Sitting, Cuff Size: Normal)   Pulse 84   Temp 98.2 F (36.8 C) (Oral)   Ht '5\' 7"'$  (1.702 m)   Wt 177 lb 8 oz (80.5 kg)   BMI 27.80 kg/m  No LMP for male patient.  General:   Alert,  Well-developed, well-nourished, pleasant and cooperative in NAD Head:  Normocephalic and atraumatic. Eyes:  Sclera clear, no icterus.   Conjunctiva pink. Ears:  Normal auditory acuity. Nose:  No deformity, discharge, or lesions. Mouth:  No deformity or lesions,oropharynx pink & moist. Neck:  Supple; no masses or thyromegaly. Lungs:  Respirations even and unlabored.  Clear throughout to auscultation.   No wheezes, crackles, or rhonchi. No acute distress. Heart:  Regular rate and rhythm; no murmurs, clicks,  rubs, or gallops. Abdomen:  Normal bowel sounds. Soft, non-tender and non-distended without masses, hepatosplenomegaly or hernias noted.  No guarding or rebound tenderness.   Rectal: Not performed Msk:  Symmetrical without gross deformities. Good, equal movement & strength bilaterally. Pulses:  Normal pulses noted. Extremities:  No clubbing or edema.  No cyanosis. Neurologic:  Alert and oriented x3;  grossly normal neurologically. Skin:  Intact without significant lesions or rashes. No jaundice. Psych:  Alert and cooperative. Normal mood and affect.  Imaging Studies: Reviewed  Assessment and Plan:   Tristan Bender is a 62 y.o. 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 He also has history of LA grade D esophagitis now with esophageal ulcer  Patient is here for follow-up of chronic right upper lateral subcostal pain This is of unclear etiology Cross-sectional imaging has been negative, upper endoscopy revealed only large clean-based esophageal ulcer.  Therefore advised patient to start taking omeprazole 40 mg p.o. twice daily and see if it helps with his pain, although less likely the etiology of his pain Trial of viscous lidocaine 15 mL every 6-8 hours as needed, prescription sent Patient has an appointment to see pain medicine specialist  Chronic nonbloody diarrhea without any weight loss GI profile PCR was negative in the past Check pancreatic fecal elastase levels Tried to treat with Xifaxan for possible bacterial overgrowth, patient could not afford the medication Advised patient to try Imodium in the interim If his diarrhea is persisting, he will need flexible sigmoidoscopy with biopsies  to rule out microscopic colitis  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 for a kidney transplant, he can decide to see GI at [facility] itself  Follow up as needed   Cephas Darby, MD

## 2021-10-19 NOTE — Telephone Encounter (Signed)
Left message on voicemail.

## 2021-11-18 ENCOUNTER — Emergency Department
Admission: EM | Admit: 2021-11-18 | Discharge: 2021-11-18 | Disposition: A | Discharge status: Home / Self Care | Payer: Medicare Other | Attending: Emergency Medicine | Admitting: Emergency Medicine

## 2021-11-18 ENCOUNTER — Other Ambulatory Visit: Payer: Self-pay

## 2021-11-18 ENCOUNTER — Emergency Department: Payer: Medicare Other

## 2021-11-18 DIAGNOSIS — I10 Essential (primary) hypertension: Secondary | ICD-10-CM | POA: Diagnosis not present

## 2021-11-18 DIAGNOSIS — R1013 Epigastric pain: Secondary | ICD-10-CM | POA: Diagnosis present

## 2021-11-18 DIAGNOSIS — K279 Peptic ulcer, site unspecified, unspecified as acute or chronic, without hemorrhage or perforation: Secondary | ICD-10-CM | POA: Insufficient documentation

## 2021-11-18 LAB — COMPREHENSIVE METABOLIC PANEL
ALT: 9 U/L (ref 0–44)
AST: 10 U/L — ABNORMAL LOW (ref 15–41)
Albumin: 3.2 g/dL — ABNORMAL LOW (ref 3.5–5.0)
Alkaline Phosphatase: 70 U/L (ref 38–126)
Anion gap: 11 (ref 5–15)
BUN: 35 mg/dL — ABNORMAL HIGH (ref 8–23)
CO2: 28 mmol/L (ref 22–32)
Calcium: 7.9 mg/dL — ABNORMAL LOW (ref 8.9–10.3)
Chloride: 97 mmol/L — ABNORMAL LOW (ref 98–111)
Creatinine, Ser: 9.09 mg/dL — ABNORMAL HIGH (ref 0.61–1.24)
GFR, Estimated: 6 mL/min — ABNORMAL LOW (ref >60)
Glucose, Bld: 94 mg/dL (ref 70–99)
Potassium: 4 mmol/L (ref 3.5–5.1)
Sodium: 136 mmol/L (ref 135–145)
Total Bilirubin: 0.9 mg/dL (ref 0.3–1.2)
Total Protein: 7.3 g/dL (ref 6.5–8.1)

## 2021-11-18 LAB — CBC
HCT: 34.9 % — ABNORMAL LOW (ref 39.0–52.0)
Hemoglobin: 10.5 g/dL — ABNORMAL LOW (ref 13.0–17.0)
MCH: 26.3 pg (ref 26.0–34.0)
MCHC: 30.1 g/dL (ref 30.0–36.0)
MCV: 87.5 fL (ref 80.0–100.0)
Platelets: 229 10*3/uL (ref 150–400)
RBC: 3.99 MIL/uL — ABNORMAL LOW (ref 4.22–5.81)
RDW: 15.5 % (ref 11.5–15.5)
WBC: 7.2 10*3/uL (ref 4.0–10.5)
nRBC: 0 % (ref 0.0–0.2)

## 2021-11-18 LAB — TROPONIN I (HIGH SENSITIVITY): Troponin I (High Sensitivity): 51 ng/L — ABNORMAL HIGH (ref <18)

## 2021-11-18 LAB — LIPASE, BLOOD: Lipase: 27 U/L (ref 11–51)

## 2021-11-18 MED ORDER — SUCRALFATE 1 G PO TABS: 1.0000 g | ORAL_TABLET | Freq: Four times a day (QID) | ORAL | 1 refills | Status: DC

## 2021-11-18 MED ORDER — METOCLOPRAMIDE HCL 10 MG PO TABS
10.0000 mg | ORAL_TABLET | Freq: Once | ORAL | Status: AC
  Administered 2021-11-18: 10 mg via ORAL
  Filled 2021-11-18: qty 1

## 2021-11-18 MED ORDER — FAMOTIDINE 20 MG PO TABS: 20.0000 mg | ORAL_TABLET | Freq: Two times a day (BID) | ORAL | 0 refills | Status: AC

## 2021-11-18 MED ORDER — SUCRALFATE 1 G PO TABS
1.0000 g | ORAL_TABLET | Freq: Once | ORAL | Status: AC
  Administered 2021-11-18: 1 g via ORAL
  Filled 2021-11-18: qty 1

## 2021-11-18 MED ORDER — FAMOTIDINE 20 MG PO TABS
40.0000 mg | ORAL_TABLET | Freq: Once | ORAL | Status: AC
  Administered 2021-11-18: 40 mg via ORAL
  Filled 2021-11-18: qty 2

## 2021-11-18 MED ORDER — METOCLOPRAMIDE HCL 10 MG PO TABS: 10.0000 mg | ORAL_TABLET | Freq: Four times a day (QID) | ORAL | 0 refills | Status: DC | PRN

## 2021-11-18 NOTE — ED Notes (Signed)
Dr. Joni Fears at bedside assessing pt at this time.

## 2021-11-18 NOTE — ED Triage Notes (Signed)
Pt in with co abd pain that started a few days ago, has had nausea no vomiting. Pt has been on omeprazole for the same but symptoms have worsened. Pt states pain to mid abd. Pt is on dialysis last treatment Tuesday.

## 2021-11-18 NOTE — ED Notes (Signed)
AVS with prescriptions provided to and discussed with patient. Pt verbalizes understanding of discharge instructions and denies any questions or concerns at this time. Pt ambulated out of department independently with steady gait. ? ?

## 2021-11-18 NOTE — ED Provider Notes (Signed)
[facility] Provider Note    Event Date/Time   First MD Initiated Contact with Patient 11/18/21 [phone]     (approximate)   History   Chief Complaint: Abdominal Pain   HPI  Tristan Bender is a 61 y.o. male with a history of hypertension, Billroth II gastric bypass who comes ED complaining of epigastric pain for the past 3 days.  Intermittent.  Worse with eating, and he is been eating poorly for the last few days as a result.  Feels like his symptoms from stomach ulcer for which he has been taking omeprazole for the last few months ever since having an upper endoscopy.  Pain is nonradiating, waxing and waning, constant.  No vomiting.  No diarrhea or constipation, no black or bloody stool.  Outside records reviewed, upper endoscopy from April 2023 shows esophageal ulcer near the GEJ.  There is also inflammation at the GJ staple line.     Physical Exam   Triage Vital Signs: ED Triage Vitals  Enc Vitals Group     BP 11/18/21 0643 126/84     Pulse Rate 11/18/21 0643 75     Resp 11/18/21 0643 20     Temp 11/18/21 0643 97.9 F (36.6 C)     Temp Source 11/18/21 0643 Oral     SpO2 11/18/21 0643 100 %     Weight 11/18/21 0644 174 lb (78.9 kg)     Height 11/18/21 0644 '5\' 7"'$  (1.702 m)     Head Circumference --      Peak Flow --      Pain Score 11/18/21 0644 6     Pain Loc --      Pain Edu? --      Excl. in [place]? --     Most recent vital signs: Vitals:   11/18/21 0730 11/18/21 0800  BP: 111/69 124/83  Pulse: 73 70  Resp: 19 18  Temp:    SpO2: 99% 97%    General: Awake, no distress.  CV:  Good peripheral perfusion.  Regular rate rhythm Resp:  Normal effort.  Clear to auscultation bilaterally Abd:  No distention.  Soft with epigastric tenderness.  No rebound or rigidity or guarding. Other:  No lower extremity edema.  Moist oral mucosa   ED Results / Procedures / Treatments   Labs (all labs ordered are listed, but only abnormal results are  displayed) Labs Reviewed  CBC - Abnormal; Notable for the following components:      Result Value   RBC 3.99 (*)    Hemoglobin 10.5 (*)    HCT 34.9 (*)    All other components within normal limits  COMPREHENSIVE METABOLIC PANEL - Abnormal; Notable for the following components:   Chloride 97 (*)    BUN 35 (*)    Creatinine, Ser 9.09 (*)    Calcium 7.9 (*)    Albumin 3.2 (*)    AST 10 (*)    GFR, Estimated 6 (*)    All other components within normal limits  TROPONIN I (HIGH SENSITIVITY) - Abnormal; Notable for the following components:   Troponin I (High Sensitivity) 51 (*)    All other components within normal limits  LIPASE, BLOOD     EKG Interpreted by me Sinus rhythm, rate of 78.  Normal axis, normal intervals.  Incomplete right bundle branch block.  Normal ST segments and T waves.  No ischemic changes.   RADIOLOGY X-ray KUB interpreted by me, appears normal without evidence of  obstruction.   PROCEDURES:  .1-3 Lead EKG Interpretation  Performed by: Carrie Mew, MD Authorized by: Carrie Mew, MD     Interpretation: normal     ECG rate:  80   ECG rate assessment: normal     Rhythm: sinus rhythm     Ectopy: none     Conduction: normal      MEDICATIONS ORDERED IN ED: Medications  sucralfate (CARAFATE) tablet 1 g (1 g Oral Given 11/18/21 0758)  metoCLOPramide (REGLAN) tablet 10 mg (10 mg Oral Given 11/18/21 0759)  famotidine (PEPCID) tablet 40 mg (40 mg Oral Given 11/18/21 0759)     IMPRESSION / MDM / ASSESSMENT AND PLAN / ED COURSE  I reviewed the triage vital signs and the nursing notes.                              Differential diagnosis includes, but is not limited to, gastritis, peptic ulcer disease, bowel obstruction, pancreatitis.  Doubt hernia, perforation, abscess, cholecystitis, ACS  Patient's presentation is most consistent with acute presentation with potential threat to life or bodily function.  Patient presents with epigastric pain  which feels to him like exacerbation of symptoms related to peptic ulcer disease despite taking omeprazole.  Labs are all reassuring, lipase normal.  Troponin was obtained via nursing triage protocol, but symptoms are noncardiac and cardiac work-up is not indicated.  We will give Carafate Reglan Pepcid, plan for continued follow-up with gastroenterology.       FINAL CLINICAL IMPRESSION(S) / ED DIAGNOSES   Final diagnoses:  Epigastric pain  PUD (peptic ulcer disease)     Rx / DC Orders   ED Discharge Orders          Ordered    famotidine (PEPCID) 20 MG tablet  2 times daily        11/18/21 0848    sucralfate (CARAFATE) 1 g tablet  4 times daily        11/18/21 0848    metoCLOPramide (REGLAN) 10 MG tablet  Every 6 hours PRN        11/18/21 0848             Note:  This document was prepared using Dragon voice recognition software and may include unintentional dictation errors.   Carrie Mew, MD 11/18/21 [phone]

## 2021-11-18 NOTE — ED Notes (Signed)
Portable Xray at bedside.

## 2021-12-29 ENCOUNTER — Ambulatory Visit
Admission: RE | Admit: 2021-12-29 | Discharge: 2021-12-29 | Disposition: A | Discharge status: Home / Self Care | Payer: Medicare Other | Source: Ambulatory Visit | Attending: Physician Assistant | Admitting: Physician Assistant

## 2021-12-29 ENCOUNTER — Other Ambulatory Visit: Payer: Self-pay | Admitting: Physician Assistant

## 2021-12-29 ENCOUNTER — Other Ambulatory Visit (HOSPITAL_COMMUNITY): Payer: Self-pay | Admitting: Physician Assistant

## 2021-12-29 DIAGNOSIS — R479 Unspecified speech disturbances: Secondary | ICD-10-CM

## 2021-12-29 DIAGNOSIS — R251 Tremor, unspecified: Secondary | ICD-10-CM

## 2022-04-12 ENCOUNTER — Ambulatory Visit: Payer: Self-pay | Admitting: Cardiovascular Disease

## 2022-04-12 DIAGNOSIS — I429 Cardiomyopathy, unspecified: Secondary | ICD-10-CM | POA: Insufficient documentation

## 2022-04-12 MED ORDER — SODIUM CHLORIDE 0.9% FLUSH
3.0000 mL | Freq: Two times a day (BID) | INTRAVENOUS | Status: AC
  Filled 2022-04-12: qty 3

## 2022-04-15 ENCOUNTER — Encounter: Payer: Self-pay | Admitting: Cardiovascular Disease

## 2022-04-15 ENCOUNTER — Ambulatory Visit
Admission: RE | Admit: 2022-04-15 | Discharge: 2022-04-15 | Disposition: A | Discharge status: Home / Self Care | Payer: Medicare Other | Attending: Cardiovascular Disease | Admitting: Cardiovascular Disease

## 2022-04-15 ENCOUNTER — Other Ambulatory Visit: Payer: Self-pay

## 2022-04-15 ENCOUNTER — Encounter
Admission: RE | Disposition: A | Discharge status: Home / Self Care | Payer: Self-pay | Source: Home / Self Care | Attending: Cardiovascular Disease

## 2022-04-15 DIAGNOSIS — I429 Cardiomyopathy, unspecified: Secondary | ICD-10-CM

## 2022-04-15 HISTORY — PX: LEFT HEART CATH AND CORONARY ANGIOGRAPHY: CATH118249

## 2022-04-15 SURGERY — LEFT HEART CATH AND CORONARY ANGIOGRAPHY
Anesthesia: Moderate Sedation

## 2022-04-15 MED ORDER — SODIUM CHLORIDE 0.9% FLUSH: 3.0000 mL | Freq: Two times a day (BID) | INTRAVENOUS | Status: DC

## 2022-04-15 MED ORDER — ASPIRIN 81 MG PO CHEW: 81.0000 mg | CHEWABLE_TABLET | ORAL | Status: DC

## 2022-04-15 MED ORDER — SODIUM CHLORIDE 0.9% FLUSH: 3.0000 mL | INTRAVENOUS | Status: DC | PRN

## 2022-04-15 MED ORDER — SODIUM CHLORIDE 0.9 % IV SOLN
250.0000 mL | INTRAVENOUS | Status: DC | PRN
  Administered 2022-04-15: 250 mL via INTRAVENOUS

## 2022-04-15 MED ORDER — ONDANSETRON HCL 4 MG/2ML IJ SOLN: 4.0000 mg | Freq: Four times a day (QID) | INTRAMUSCULAR | Status: DC | PRN

## 2022-04-15 MED ORDER — SODIUM CHLORIDE 0.9 % IV SOLN: 250.0000 mL | INTRAVENOUS | Status: DC | PRN

## 2022-04-15 MED ORDER — SODIUM CHLORIDE 0.9 % WEIGHT BASED INFUSION: 1.0000 mL/kg/h | INTRAVENOUS | Status: DC

## 2022-04-15 MED ORDER — LIDOCAINE HCL (PF) 1 % IJ SOLN: INTRAMUSCULAR | Status: DC | PRN

## 2022-04-15 MED ORDER — HYDRALAZINE HCL 20 MG/ML IJ SOLN: 10.0000 mg | INTRAMUSCULAR | Status: DC | PRN

## 2022-04-15 MED ORDER — FENTANYL CITRATE (PF) 100 MCG/2ML IJ SOLN
INTRAMUSCULAR | Status: AC
  Filled 2022-04-15: qty 2

## 2022-04-15 MED ORDER — IOHEXOL 300 MG/ML  SOLN
INTRAMUSCULAR | Status: DC | PRN
  Administered 2022-04-15: 185 mL

## 2022-04-15 MED ORDER — SODIUM CHLORIDE 0.9 % WEIGHT BASED INFUSION: 3.0000 mL/kg/h | INTRAVENOUS | Status: DC

## 2022-04-15 MED ORDER — HEPARIN (PORCINE) IN NACL 1000-0.9 UT/500ML-% IV SOLN
INTRAVENOUS | Status: DC | PRN
  Administered 2022-04-15 (×2): 500 mL

## 2022-04-15 MED ORDER — HEPARIN (PORCINE) IN NACL 1000-0.9 UT/500ML-% IV SOLN
INTRAVENOUS | Status: AC
  Filled 2022-04-15: qty 1000

## 2022-04-15 MED ORDER — ACETAMINOPHEN 325 MG PO TABS: 650.0000 mg | ORAL_TABLET | ORAL | Status: DC | PRN

## 2022-04-15 MED ORDER — LABETALOL HCL 5 MG/ML IV SOLN: 10.0000 mg | INTRAVENOUS | Status: DC | PRN

## 2022-04-15 MED ORDER — FENTANYL CITRATE (PF) 100 MCG/2ML IJ SOLN
INTRAMUSCULAR | Status: DC | PRN
  Administered 2022-04-15: 75 ug via INTRAVENOUS

## 2022-04-15 SURGICAL SUPPLY — 18 items
CATH AMP RT 5F (CATHETERS) IMPLANT
CATH INFINITI 5 FR 3DRC (CATHETERS) IMPLANT
CATH INFINITI 5 FR JR3.5 (CATHETERS) IMPLANT
CATH INFINITI 5 FR MPA2 (CATHETERS) IMPLANT
CATH INFINITI 5FR JL5 (CATHETERS) IMPLANT
CATH INFINITI 5FR MULTPACK ANG (CATHETERS) IMPLANT
CATH JL3.5 FR DIAG (CATHETERS) IMPLANT
DEVICE CLOSURE MYNXGRIP 5F (Vascular Products) IMPLANT
DRAPE BRACHIAL (DRAPES) IMPLANT
KIT SYRINGE INJ CVI SPIKEX1 (MISCELLANEOUS) IMPLANT
NDL PERC 18GX7CM (NEEDLE) IMPLANT
NEEDLE PERC 18GX7CM (NEEDLE) ×1 IMPLANT
PACK CARDIAC CATH (CUSTOM PROCEDURE TRAY) ×1 IMPLANT
PROTECTION STATION PRESSURIZED (MISCELLANEOUS) ×1
SHEATH AVANTI 5FR X 11CM (SHEATH) IMPLANT
SHEATH AVANTI 7FRX11 (SHEATH) IMPLANT
STATION PROTECTION PRESSURIZED (MISCELLANEOUS) IMPLANT
WIRE GUIDERIGHT .035X150 (WIRE) IMPLANT

## 2022-04-15 NOTE — Progress Notes (Signed)
Pt. Handed Cath CD for todays heart cath from [facility], tech. Pt. 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

## 2022-04-15 NOTE — Progress Notes (Signed)
IVF's to 10 ml/hr. Secondary to pt. With cardiomyopathy.

## 2022-04-18 ENCOUNTER — Encounter: Payer: Self-pay | Admitting: Cardiovascular Disease

## 2022-05-05 ENCOUNTER — Emergency Department
Admission: EM | Admit: 2022-05-05 | Discharge: 2022-05-05 | Discharge status: Against Medical Advice | Payer: Medicare Other

## 2022-05-13 ENCOUNTER — Encounter: Payer: Self-pay | Admitting: Gastroenterology

## 2022-07-25 ENCOUNTER — Telehealth: Payer: Self-pay | Admitting: Internal Medicine

## 2022-07-25 NOTE — Telephone Encounter (Signed)
S/w patient. No longer coming to our office for visits. Patient discharged-Toni

## 2022-08-29 ENCOUNTER — Ambulatory Visit: Payer: Medicare Other | Admitting: Cardiovascular Disease

## 2022-09-09 ENCOUNTER — Ambulatory Visit: Payer: Medicare Other | Admitting: Cardiovascular Disease

## 2022-09-13 ENCOUNTER — Encounter: Payer: Self-pay | Admitting: Family Medicine

## 2022-09-13 ENCOUNTER — Inpatient Hospital Stay: Payer: Medicare Other

## 2022-09-13 ENCOUNTER — Encounter: Admission: EM | Disposition: E | Payer: Self-pay | Source: Home / Self Care | Attending: Family Medicine

## 2022-09-13 ENCOUNTER — Other Ambulatory Visit: Payer: Self-pay

## 2022-09-13 ENCOUNTER — Inpatient Hospital Stay: Payer: Medicare Other | Admitting: Anesthesiology

## 2022-09-13 ENCOUNTER — Emergency Department: Payer: Medicare Other

## 2022-09-13 ENCOUNTER — Inpatient Hospital Stay: Admit: 2022-09-13 | Payer: Medicare Other

## 2022-09-13 ENCOUNTER — Inpatient Hospital Stay
Admission: EM | Admit: 2022-09-13 | Discharge: 2022-10-15 | DRG: 377 | Disposition: E | Payer: Medicare Other | Attending: Internal Medicine | Admitting: Internal Medicine

## 2022-09-13 DIAGNOSIS — K266 Chronic or unspecified duodenal ulcer with both hemorrhage and perforation: Secondary | ICD-10-CM | POA: Diagnosis present

## 2022-09-13 DIAGNOSIS — G2581 Restless legs syndrome: Secondary | ICD-10-CM | POA: Diagnosis present

## 2022-09-13 DIAGNOSIS — G9341 Metabolic encephalopathy: Secondary | ICD-10-CM | POA: Diagnosis not present

## 2022-09-13 DIAGNOSIS — T17908A Unspecified foreign body in respiratory tract, part unspecified causing other injury, initial encounter: Secondary | ICD-10-CM | POA: Insufficient documentation

## 2022-09-13 DIAGNOSIS — Z980 Intestinal bypass and anastomosis status: Secondary | ICD-10-CM

## 2022-09-13 DIAGNOSIS — N186 End stage renal disease: Secondary | ICD-10-CM | POA: Diagnosis present

## 2022-09-13 DIAGNOSIS — J9601 Acute respiratory failure with hypoxia: Secondary | ICD-10-CM | POA: Insufficient documentation

## 2022-09-13 DIAGNOSIS — Z992 Dependence on renal dialysis: Secondary | ICD-10-CM | POA: Diagnosis not present

## 2022-09-13 DIAGNOSIS — I469 Cardiac arrest, cause unspecified: Secondary | ICD-10-CM | POA: Diagnosis not present

## 2022-09-13 DIAGNOSIS — E785 Hyperlipidemia, unspecified: Secondary | ICD-10-CM | POA: Diagnosis present

## 2022-09-13 DIAGNOSIS — Z79899 Other long term (current) drug therapy: Secondary | ICD-10-CM

## 2022-09-13 DIAGNOSIS — R578 Other shock: Secondary | ICD-10-CM | POA: Diagnosis not present

## 2022-09-13 DIAGNOSIS — I429 Cardiomyopathy, unspecified: Secondary | ICD-10-CM | POA: Diagnosis present

## 2022-09-13 DIAGNOSIS — I4901 Ventricular fibrillation: Secondary | ICD-10-CM | POA: Diagnosis not present

## 2022-09-13 DIAGNOSIS — D62 Acute posthemorrhagic anemia: Secondary | ICD-10-CM | POA: Diagnosis present

## 2022-09-13 DIAGNOSIS — T80818A Extravasation of other vesicant agent, initial encounter: Secondary | ICD-10-CM | POA: Diagnosis not present

## 2022-09-13 DIAGNOSIS — K922 Gastrointestinal hemorrhage, unspecified: Secondary | ICD-10-CM | POA: Diagnosis present

## 2022-09-13 DIAGNOSIS — K264 Chronic or unspecified duodenal ulcer with hemorrhage: Secondary | ICD-10-CM | POA: Diagnosis present

## 2022-09-13 DIAGNOSIS — D631 Anemia in chronic kidney disease: Secondary | ICD-10-CM | POA: Diagnosis present

## 2022-09-13 DIAGNOSIS — I4891 Unspecified atrial fibrillation: Secondary | ICD-10-CM | POA: Diagnosis present

## 2022-09-13 DIAGNOSIS — I472 Ventricular tachycardia, unspecified: Secondary | ICD-10-CM | POA: Diagnosis not present

## 2022-09-13 DIAGNOSIS — K3189 Other diseases of stomach and duodenum: Secondary | ICD-10-CM | POA: Diagnosis present

## 2022-09-13 DIAGNOSIS — K2289 Other specified disease of esophagus: Secondary | ICD-10-CM | POA: Diagnosis present

## 2022-09-13 DIAGNOSIS — Z841 Family history of disorders of kidney and ureter: Secondary | ICD-10-CM

## 2022-09-13 DIAGNOSIS — E875 Hyperkalemia: Secondary | ICD-10-CM | POA: Diagnosis present

## 2022-09-13 DIAGNOSIS — N2581 Secondary hyperparathyroidism of renal origin: Secondary | ICD-10-CM | POA: Diagnosis present

## 2022-09-13 DIAGNOSIS — Z66 Do not resuscitate: Secondary | ICD-10-CM | POA: Diagnosis present

## 2022-09-13 DIAGNOSIS — Z515 Encounter for palliative care: Secondary | ICD-10-CM

## 2022-09-13 DIAGNOSIS — T17418A Gastric contents in trachea causing other injury, initial encounter: Secondary | ICD-10-CM | POA: Diagnosis present

## 2022-09-13 DIAGNOSIS — R944 Abnormal results of kidney function studies: Secondary | ICD-10-CM | POA: Diagnosis present

## 2022-09-13 DIAGNOSIS — Z9884 Bariatric surgery status: Secondary | ICD-10-CM

## 2022-09-13 DIAGNOSIS — G4733 Obstructive sleep apnea (adult) (pediatric): Secondary | ICD-10-CM | POA: Diagnosis present

## 2022-09-13 DIAGNOSIS — D649 Anemia, unspecified: Secondary | ICD-10-CM | POA: Diagnosis present

## 2022-09-13 DIAGNOSIS — K56609 Unspecified intestinal obstruction, unspecified as to partial versus complete obstruction: Secondary | ICD-10-CM | POA: Diagnosis present

## 2022-09-13 DIAGNOSIS — G8929 Other chronic pain: Secondary | ICD-10-CM | POA: Diagnosis present

## 2022-09-13 DIAGNOSIS — I4510 Unspecified right bundle-branch block: Secondary | ICD-10-CM | POA: Diagnosis present

## 2022-09-13 DIAGNOSIS — I959 Hypotension, unspecified: Secondary | ICD-10-CM | POA: Diagnosis present

## 2022-09-13 DIAGNOSIS — I10 Essential (primary) hypertension: Secondary | ICD-10-CM | POA: Diagnosis present

## 2022-09-13 DIAGNOSIS — Z8719 Personal history of other diseases of the digestive system: Secondary | ICD-10-CM

## 2022-09-13 DIAGNOSIS — I120 Hypertensive chronic kidney disease with stage 5 chronic kidney disease or end stage renal disease: Secondary | ICD-10-CM | POA: Diagnosis present

## 2022-09-13 DIAGNOSIS — K2100 Gastro-esophageal reflux disease with esophagitis, without bleeding: Secondary | ICD-10-CM | POA: Diagnosis present

## 2022-09-13 DIAGNOSIS — K449 Diaphragmatic hernia without obstruction or gangrene: Secondary | ICD-10-CM | POA: Diagnosis present

## 2022-09-13 DIAGNOSIS — D132 Benign neoplasm of duodenum: Secondary | ICD-10-CM | POA: Diagnosis present

## 2022-09-13 DIAGNOSIS — Z539 Procedure and treatment not carried out, unspecified reason: Secondary | ICD-10-CM | POA: Diagnosis not present

## 2022-09-13 DIAGNOSIS — I468 Cardiac arrest due to other underlying condition: Secondary | ICD-10-CM | POA: Diagnosis not present

## 2022-09-13 HISTORY — PX: ESOPHAGOGASTRODUODENOSCOPY (EGD) WITH PROPOFOL: SHX5813

## 2022-09-13 HISTORY — PX: ESOPHAGOGASTRODUODENOSCOPY: SHX5428

## 2022-09-13 LAB — GLUCOSE, CAPILLARY: Glucose-Capillary: 84 mg/dL (ref 70–99)

## 2022-09-13 LAB — CBC WITH DIFFERENTIAL/PLATELET
Abs Immature Granulocytes: 0.14 10*3/uL — ABNORMAL HIGH (ref 0.00–0.07)
Basophils Absolute: 0 10*3/uL (ref 0.0–0.1)
Basophils Relative: 0 %
Eosinophils Absolute: 0 10*3/uL (ref 0.0–0.5)
Eosinophils Relative: 0 %
HCT: 25.6 % — ABNORMAL LOW (ref 39.0–52.0)
Hemoglobin: 7.8 g/dL — ABNORMAL LOW (ref 13.0–17.0)
Immature Granulocytes: 1 %
Lymphocytes Relative: 15 %
Lymphs Abs: 1.7 10*3/uL (ref 0.7–4.0)
MCH: 27.4 pg (ref 26.0–34.0)
MCHC: 30.5 g/dL (ref 30.0–36.0)
MCV: 89.8 fL (ref 80.0–100.0)
Monocytes Absolute: 0.3 10*3/uL (ref 0.1–1.0)
Monocytes Relative: 3 %
Neutro Abs: 9.3 10*3/uL — ABNORMAL HIGH (ref 1.7–7.7)
Neutrophils Relative %: 81 %
Platelets: 143 10*3/uL — ABNORMAL LOW (ref 150–400)
RBC: 2.85 MIL/uL — ABNORMAL LOW (ref 4.22–5.81)
RDW: 17.1 % — ABNORMAL HIGH (ref 11.5–15.5)
WBC: 11.5 10*3/uL — ABNORMAL HIGH (ref 4.0–10.5)
nRBC: 0 % (ref 0.0–0.2)

## 2022-09-13 LAB — COMPREHENSIVE METABOLIC PANEL
ALT: 10 U/L (ref 0–44)
AST: 24 U/L (ref 15–41)
Albumin: 2.5 g/dL — ABNORMAL LOW (ref 3.5–5.0)
Alkaline Phosphatase: 68 U/L (ref 38–126)
Anion gap: 16 — ABNORMAL HIGH (ref 5–15)
BUN: 48 mg/dL — ABNORMAL HIGH (ref 8–23)
CO2: 24 mmol/L (ref 22–32)
Calcium: 6.7 mg/dL — ABNORMAL LOW (ref 8.9–10.3)
Chloride: 98 mmol/L (ref 98–111)
Creatinine, Ser: 8.17 mg/dL — ABNORMAL HIGH (ref 0.61–1.24)
GFR, Estimated: 7 mL/min — ABNORMAL LOW (ref >60)
Glucose, Bld: 137 mg/dL — ABNORMAL HIGH (ref 70–99)
Potassium: 4.1 mmol/L (ref 3.5–5.1)
Sodium: 138 mmol/L (ref 135–145)
Total Bilirubin: 0.9 mg/dL (ref 0.3–1.2)
Total Protein: 6.5 g/dL (ref 6.5–8.1)

## 2022-09-13 LAB — PREPARE RBC (CROSSMATCH)

## 2022-09-13 LAB — BPAM FFP
Blood Product Expiration Date: 202405052359
Blood Product Expiration Date: 202405052359
Unit Type and Rh: 6200
Unit Type and Rh: 6200
Unit Type and Rh: 6200

## 2022-09-13 LAB — BPAM RBC
Blood Product Expiration Date: 202405242359
Blood Product Expiration Date: 202405242359
Blood Product Expiration Date: 202406032359
Blood Product Expiration Date: 202406052359
Blood Product Expiration Date: 202406052359
ISSUE DATE / TIME: 202404301900
ISSUE DATE / TIME: 202404301930
ISSUE DATE / TIME: 202404301956
ISSUE DATE / TIME: 202404302037
Unit Type and Rh: 5100
Unit Type and Rh: 6200
Unit Type and Rh: 6200

## 2022-09-13 LAB — TYPE AND SCREEN
Antibody Screen: NEGATIVE
Unit division: 0
Unit division: 0
Unit division: 0

## 2022-09-13 LAB — BASIC METABOLIC PANEL
Anion gap: 14 (ref 5–15)
BUN: 50 mg/dL — ABNORMAL HIGH (ref 8–23)
CO2: 17 mmol/L — ABNORMAL LOW (ref 22–32)
Calcium: 5.1 mg/dL — CL (ref 8.9–10.3)
Chloride: 104 mmol/L (ref 98–111)
Creatinine, Ser: 6.8 mg/dL — ABNORMAL HIGH (ref 0.61–1.24)
GFR, Estimated: 9 mL/min — ABNORMAL LOW (ref >60)
Glucose, Bld: 316 mg/dL — ABNORMAL HIGH (ref 70–99)
Potassium: 6.5 mmol/L (ref 3.5–5.1)
Sodium: 135 mmol/L (ref 135–145)

## 2022-09-13 LAB — PREPARE FRESH FROZEN PLASMA
Unit division: 0
Unit division: 0

## 2022-09-13 LAB — CBC
HCT: 29.3 % — ABNORMAL LOW (ref 39.0–52.0)
Hemoglobin: 8.1 g/dL — ABNORMAL LOW (ref 13.0–17.0)
MCH: 24.5 pg — ABNORMAL LOW (ref 26.0–34.0)
MCHC: 27.6 g/dL — ABNORMAL LOW (ref 30.0–36.0)
MCV: 88.5 fL (ref 80.0–100.0)
Platelets: 241 10*3/uL (ref 150–400)
RBC: 3.31 MIL/uL — ABNORMAL LOW (ref 4.22–5.81)
RDW: 18.2 % — ABNORMAL HIGH (ref 11.5–15.5)
WBC: 21 10*3/uL — ABNORMAL HIGH (ref 4.0–10.5)
nRBC: 0 % (ref 0.0–0.2)

## 2022-09-13 LAB — PROTIME-INR
INR: 1.3 — ABNORMAL HIGH (ref 0.8–1.2)
INR: 1.8 — ABNORMAL HIGH (ref 0.8–1.2)
Prothrombin Time: 16.2 seconds — ABNORMAL HIGH (ref 11.4–15.2)
Prothrombin Time: 20.5 seconds — ABNORMAL HIGH (ref 11.4–15.2)

## 2022-09-13 LAB — BLOOD GAS, ARTERIAL
Acid-base deficit: 14 mmol/L — ABNORMAL HIGH (ref 0.0–2.0)
Bicarbonate: 12.9 mmol/L — ABNORMAL LOW (ref 20.0–28.0)
FIO2: 50 %
MECHVT: 500 mL
Mechanical Rate: 16
O2 Saturation: 100 %
PEEP: 5 cmH2O
Patient temperature: 37
pCO2 arterial: 33 mmHg (ref 32–48)
pH, Arterial: 7.2 — ABNORMAL LOW (ref 7.35–7.45)
pO2, Arterial: 144 mmHg — ABNORMAL HIGH (ref 83–108)

## 2022-09-13 LAB — MRSA NEXT GEN BY PCR, NASAL: MRSA by PCR Next Gen: NOT DETECTED

## 2022-09-13 LAB — TROPONIN I (HIGH SENSITIVITY): Troponin I (High Sensitivity): 75 ng/L — ABNORMAL HIGH (ref <18)

## 2022-09-13 LAB — PHOSPHORUS: Phosphorus: 6.2 mg/dL — ABNORMAL HIGH (ref 2.5–4.6)

## 2022-09-13 LAB — RENAL FUNCTION PANEL
Albumin: <1.5 g/dL — ABNORMAL LOW (ref 3.5–5.0)
Anion gap: 17 — ABNORMAL HIGH (ref 5–15)
BUN: 48 mg/dL — ABNORMAL HIGH (ref 8–23)
CO2: 14 mmol/L — ABNORMAL LOW (ref 22–32)
Calcium: 5.1 mg/dL — CL (ref 8.9–10.3)
Chloride: 104 mmol/L (ref 98–111)
Creatinine, Ser: 6.97 mg/dL — ABNORMAL HIGH (ref 0.61–1.24)
GFR, Estimated: 8 mL/min — ABNORMAL LOW (ref >60)
Glucose, Bld: 312 mg/dL — ABNORMAL HIGH (ref 70–99)
Phosphorus: 7.6 mg/dL — ABNORMAL HIGH (ref 2.5–4.6)
Potassium: 6.5 mmol/L (ref 3.5–5.1)
Sodium: 135 mmol/L (ref 135–145)

## 2022-09-13 LAB — LACTIC ACID, PLASMA: Lactic Acid, Venous: 5.5 mmol/L (ref 0.5–1.9)

## 2022-09-13 LAB — HEMOGLOBIN AND HEMATOCRIT, BLOOD
HCT: 30.4 % — ABNORMAL LOW (ref 39.0–52.0)
Hemoglobin: 9.3 g/dL — ABNORMAL LOW (ref 13.0–17.0)

## 2022-09-13 LAB — PREPARE PLATELET PHERESIS: Unit division: 0

## 2022-09-13 LAB — BPAM CRYOPRECIPITATE
ISSUE DATE / TIME: 202404302203
Unit Type and Rh: 5100

## 2022-09-13 LAB — BPAM PLATELET PHERESIS

## 2022-09-13 LAB — PREPARE CRYOPRECIPITATE

## 2022-09-13 LAB — APTT: aPTT: 41 seconds — ABNORMAL HIGH (ref 24–36)

## 2022-09-13 LAB — MAGNESIUM: Magnesium: 1.7 mg/dL (ref 1.7–2.4)

## 2022-09-13 LAB — FIBRINOGEN: Fibrinogen: 263 mg/dL (ref 210–475)

## 2022-09-13 LAB — LIPASE, BLOOD: Lipase: 90 U/L — ABNORMAL HIGH (ref 11–51)

## 2022-09-13 SURGERY — EGD (ESOPHAGOGASTRODUODENOSCOPY)
Anesthesia: General

## 2022-09-13 SURGERY — ESOPHAGOGASTRODUODENOSCOPY (EGD) WITH PROPOFOL
Anesthesia: General

## 2022-09-13 MED ORDER — SODIUM CHLORIDE 0.9% IV SOLUTION: Freq: Once | INTRAVENOUS | Status: DC

## 2022-09-13 MED ORDER — ONDANSETRON HCL 4 MG PO TABS: 4.0000 mg | ORAL_TABLET | Freq: Four times a day (QID) | ORAL | Status: DC | PRN

## 2022-09-13 MED ORDER — DEXTROSE 50 % IV SOLN: 25.0000 g | INTRAVENOUS | Status: AC

## 2022-09-13 MED ORDER — PHENYLEPHRINE CONCENTRATED 100MG/250ML (0.4 MG/ML) INFUSION SIMPLE
0.0000 ug/min | INTRAVENOUS | Status: DC
  Administered 2022-09-13: 200 ug/min via INTRAVENOUS
  Filled 2022-09-13: qty 250

## 2022-09-13 MED ORDER — SODIUM CHLORIDE 0.9% IV SOLUTION
Freq: Once | INTRAVENOUS | Status: DC
  Filled 2022-09-13: qty 250

## 2022-09-13 MED ORDER — MIDAZOLAM HCL 2 MG/2ML IJ SOLN
4.0000 mg | Freq: Once | INTRAMUSCULAR | Status: AC
  Administered 2022-09-13: 4 mg via INTRAVENOUS

## 2022-09-13 MED ORDER — SODIUM CHLORIDE 0.9 % IV SOLN: 250.0000 mL | INTRAVENOUS | Status: DC

## 2022-09-13 MED ORDER — SUCCINYLCHOLINE CHLORIDE 200 MG/10ML IV SOSY
PREFILLED_SYRINGE | INTRAVENOUS | Status: AC
  Filled 2022-09-13: qty 10

## 2022-09-13 MED ORDER — SODIUM BICARBONATE 8.4 % IV SOLN
INTRAVENOUS | Status: AC
  Filled 2022-09-13: qty 100

## 2022-09-13 MED ORDER — MIDAZOLAM HCL 2 MG/2ML IJ SOLN
INTRAMUSCULAR | Status: AC
  Filled 2022-09-13: qty 2

## 2022-09-13 MED ORDER — SUCCINYLCHOLINE CHLORIDE 200 MG/10ML IV SOSY
PREFILLED_SYRINGE | INTRAVENOUS | Status: DC | PRN
  Administered 2022-09-13: 80 mg via INTRAVENOUS

## 2022-09-13 MED ORDER — PANTOPRAZOLE 80MG IVPB - SIMPLE MED
80.0000 mg | Freq: Once | INTRAVENOUS | Status: AC
  Administered 2022-09-13: 80 mg via INTRAVENOUS
  Filled 2022-09-13: qty 100

## 2022-09-13 MED ORDER — PROPOFOL 1000 MG/100ML IV EMUL: 5.0000 ug/kg/min | INTRAVENOUS | Status: DC

## 2022-09-13 MED ORDER — CHLORHEXIDINE GLUCONATE CLOTH 2 % EX PADS
6.0000 | MEDICATED_PAD | Freq: Every day | CUTANEOUS | Status: DC
  Administered 2022-09-13: 6 via TOPICAL

## 2022-09-13 MED ORDER — GLYCOPYRROLATE 0.2 MG/ML IJ SOLN: 0.2000 mg | INTRAMUSCULAR | Status: DC | PRN

## 2022-09-13 MED ORDER — FENTANYL CITRATE PF 50 MCG/ML IJ SOSY
PREFILLED_SYRINGE | INTRAMUSCULAR | Status: AC
  Filled 2022-09-13: qty 1

## 2022-09-13 MED ORDER — PROPOFOL 10 MG/ML IV BOLUS
INTRAVENOUS | Status: DC | PRN
  Administered 2022-09-13: 120 mg via INTRAVENOUS

## 2022-09-13 MED ORDER — ACETAMINOPHEN 325 MG PO TABS: 650.0000 mg | ORAL_TABLET | Freq: Four times a day (QID) | ORAL | Status: DC | PRN

## 2022-09-13 MED ORDER — PROPOFOL 1000 MG/100ML IV EMUL
5.0000 ug/kg/min | INTRAVENOUS | Status: DC
  Administered 2022-09-13: 15 ug/kg/min via INTRAVENOUS

## 2022-09-13 MED ORDER — PROPOFOL 1000 MG/100ML IV EMUL
0.0000 ug/kg/min | INTRAVENOUS | Status: DC
  Administered 2022-09-13: 15 ug/kg/min via INTRAVENOUS
  Administered 2022-09-13: 5 ug/kg/min via INTRAVENOUS

## 2022-09-13 MED ORDER — IOHEXOL 350 MG/ML SOLN
100.0000 mL | Freq: Once | INTRAVENOUS | Status: AC | PRN
  Administered 2022-09-13: 100 mL via INTRAVENOUS

## 2022-09-13 MED ORDER — MIDAZOLAM HCL 2 MG/2ML IJ SOLN
1.0000 mg | INTRAMUSCULAR | Status: DC | PRN
  Administered 2022-09-13: 2 mg via INTRAVENOUS

## 2022-09-13 MED ORDER — MIDAZOLAM-SODIUM CHLORIDE 100-0.9 MG/100ML-% IV SOLN
INTRAVENOUS | Status: AC
  Filled 2022-09-13: qty 100

## 2022-09-13 MED ORDER — PHENYLEPHRINE HCL-NACL 20-0.9 MG/250ML-% IV SOLN
25.0000 ug/min | INTRAVENOUS | Status: DC
  Administered 2022-09-13: 200 ug/min via INTRAVENOUS
  Administered 2022-09-13: 100 ug/min via INTRAVENOUS
  Filled 2022-09-13 (×2): qty 250

## 2022-09-13 MED ORDER — SODIUM CHLORIDE 0.9% IV SOLUTION: Freq: Once | INTRAVENOUS | Status: AC

## 2022-09-13 MED ORDER — EPHEDRINE SULFATE (PRESSORS) 50 MG/ML IJ SOLN
INTRAMUSCULAR | Status: DC | PRN
  Administered 2022-09-13: 10 mg via INTRAVENOUS
  Administered 2022-09-13: 5 mg via INTRAVENOUS

## 2022-09-13 MED ORDER — SODIUM CHLORIDE 0.9 % IV SOLN: 0.5000 ug/min | INTRAVENOUS | Status: DC

## 2022-09-13 MED ORDER — INSULIN ASPART 100 UNIT/ML IJ SOLN: 0.0000 [IU] | INTRAMUSCULAR | Status: DC

## 2022-09-13 MED ORDER — FENTANYL 2500MCG IN NS 250ML (10MCG/ML) PREMIX INFUSION: 0.0000 ug/h | INTRAVENOUS | Status: DC

## 2022-09-13 MED ORDER — FENTANYL CITRATE (PF) 100 MCG/2ML IJ SOLN
INTRAMUSCULAR | Status: AC
  Filled 2022-09-13: qty 2

## 2022-09-13 MED ORDER — GLYCOPYRROLATE 1 MG PO TABS: 1.0000 mg | ORAL_TABLET | ORAL | Status: DC | PRN

## 2022-09-13 MED ORDER — SODIUM CHLORIDE 0.9 % IV SOLN: INTRAVENOUS | Status: DC

## 2022-09-13 MED ORDER — ONDANSETRON HCL 4 MG/2ML IJ SOLN: 4.0000 mg | Freq: Four times a day (QID) | INTRAMUSCULAR | Status: DC | PRN

## 2022-09-13 MED ORDER — PROPOFOL 10 MG/ML IV BOLUS
INTRAVENOUS | Status: AC
  Filled 2022-09-13: qty 20

## 2022-09-13 MED ORDER — SODIUM CHLORIDE 0.9 % IV SOLN
1.0000 g | Freq: Once | INTRAVENOUS | Status: DC
  Filled 2022-09-13: qty 10

## 2022-09-13 MED ORDER — SODIUM CHLORIDE 0.9% IV SOLUTION
Freq: Once | INTRAVENOUS | Status: AC
  Filled 2022-09-13: qty 250

## 2022-09-13 MED ORDER — PHENYLEPHRINE 80 MCG/ML (10ML) SYRINGE FOR IV PUSH (FOR BLOOD PRESSURE SUPPORT)
PREFILLED_SYRINGE | INTRAVENOUS | Status: AC
  Filled 2022-09-13: qty 10

## 2022-09-13 MED ORDER — ONDANSETRON HCL 4 MG/2ML IJ SOLN
INTRAMUSCULAR | Status: AC
  Filled 2022-09-13: qty 2

## 2022-09-13 MED ORDER — NOREPINEPHRINE 16 MG/250ML-% IV SOLN
0.0000 ug/min | INTRAVENOUS | Status: DC
  Administered 2022-09-13: 40 ug/min via INTRAVENOUS
  Filled 2022-09-13: qty 250

## 2022-09-13 MED ORDER — MORPHINE SULFATE (PF) 2 MG/ML IV SOLN
2.0000 mg | INTRAVENOUS | Status: DC | PRN
  Administered 2022-09-13: 4 mg via INTRAVENOUS
  Filled 2022-09-13: qty 2

## 2022-09-13 MED ORDER — NOREPINEPHRINE 4 MG/250ML-% IV SOLN
INTRAVENOUS | Status: AC
  Administered 2022-09-13: 8 mg
  Filled 2022-09-13: qty 250

## 2022-09-13 MED ORDER — ALBUMIN HUMAN 25 % IV SOLN
25.0000 g | Freq: Four times a day (QID) | INTRAVENOUS | Status: DC
  Administered 2022-09-13: 25 g via INTRAVENOUS
  Filled 2022-09-13: qty 100

## 2022-09-13 MED ORDER — SODIUM CHLORIDE 0.9 % IV SOLN: 1.0000 g | Freq: Once | INTRAVENOUS | Status: DC

## 2022-09-13 MED ORDER — FENTANYL 2500MCG IN NS 250ML (10MCG/ML) PREMIX INFUSION
INTRAVENOUS | Status: AC
  Filled 2022-09-13: qty 250

## 2022-09-13 MED ORDER — DEXTROSE 50 % IV SOLN
INTRAVENOUS | Status: AC
  Administered 2022-09-13: 25 g via INTRAVENOUS
  Filled 2022-09-13: qty 50

## 2022-09-13 MED ORDER — FENTANYL 2500MCG IN NS 250ML (10MCG/ML) PREMIX INFUSION
50.0000 ug/h | INTRAVENOUS | Status: DC
  Administered 2022-09-13: 50 ug/h via INTRAVENOUS

## 2022-09-13 MED ORDER — DEXAMETHASONE SODIUM PHOSPHATE 10 MG/ML IJ SOLN
INTRAMUSCULAR | Status: AC
  Filled 2022-09-13: qty 1

## 2022-09-13 MED ORDER — SODIUM BICARBONATE 8.4 % IV SOLN: 50.0000 meq | Freq: Once | INTRAVENOUS | Status: DC

## 2022-09-13 MED ORDER — SODIUM CHLORIDE 0.9 % IV BOLUS
250.0000 mL | Freq: Once | INTRAVENOUS | Status: AC
  Administered 2022-09-13: 250 mL via INTRAVENOUS

## 2022-09-13 MED ORDER — MIDAZOLAM-SODIUM CHLORIDE 100-0.9 MG/100ML-% IV SOLN: 0.5000 mg/h | INTRAVENOUS | Status: DC

## 2022-09-13 MED ORDER — SODIUM CHLORIDE 0.9 % IV SOLN: Freq: Once | INTRAVENOUS | Status: AC

## 2022-09-13 MED ORDER — ACETAMINOPHEN 650 MG RE SUPP: 650.0000 mg | Freq: Four times a day (QID) | RECTAL | Status: DC | PRN

## 2022-09-13 MED ORDER — PHENYLEPHRINE HCL (PRESSORS) 10 MG/ML IV SOLN
INTRAVENOUS | Status: DC | PRN
  Administered 2022-09-13 (×2): 80 ug via INTRAVENOUS

## 2022-09-13 MED ORDER — PANTOPRAZOLE INFUSION (NEW) - SIMPLE MED
8.0000 mg/h | INTRAVENOUS | Status: DC
  Administered 2022-09-13 (×2): 8 mg/h via INTRAVENOUS
  Filled 2022-09-13 (×2): qty 100

## 2022-09-13 MED ORDER — NOREPINEPHRINE 4 MG/250ML-% IV SOLN: 0.0000 ug/min | INTRAVENOUS | Status: DC

## 2022-09-13 MED ORDER — SODIUM BICARBONATE 8.4 % IV SOLN
100.0000 meq | Freq: Once | INTRAVENOUS | Status: AC
  Administered 2022-09-13: 100 meq via INTRAVENOUS

## 2022-09-13 MED ORDER — DEXAMETHASONE SODIUM PHOSPHATE 10 MG/ML IJ SOLN
INTRAMUSCULAR | Status: DC | PRN
  Administered 2022-09-13: 5 mg via INTRAVENOUS

## 2022-09-13 MED ORDER — SODIUM BICARBONATE 8.4 % IV SOLN
INTRAVENOUS | Status: AC
  Filled 2022-09-13: qty 50

## 2022-09-13 MED ORDER — SODIUM CHLORIDE 0.9 % IV SOLN
3.0000 g | INTRAVENOUS | Status: DC
  Filled 2022-09-13: qty 8

## 2022-09-13 MED ORDER — POLYVINYL ALCOHOL 1.4 % OP SOLN: 1.0000 [drp] | Freq: Four times a day (QID) | OPHTHALMIC | Status: DC | PRN

## 2022-09-13 MED ORDER — PHENYLEPHRINE HCL-NACL 20-0.9 MG/250ML-% IV SOLN: 0.0000 ug/min | INTRAVENOUS | Status: DC

## 2022-09-13 MED ORDER — POLYETHYLENE GLYCOL 3350 17 G PO PACK: 17.0000 g | PACK | Freq: Every day | ORAL | Status: DC

## 2022-09-13 MED ORDER — DEXTROSE 50 % IV SOLN
1.0000 | Freq: Once | INTRAVENOUS | Status: AC
  Administered 2022-09-13: 50 mL via INTRAVENOUS
  Filled 2022-09-13: qty 50

## 2022-09-13 MED ORDER — FENTANYL CITRATE (PF) 100 MCG/2ML IJ SOLN
INTRAMUSCULAR | Status: DC | PRN
  Administered 2022-09-13: 50 ug via INTRAVENOUS

## 2022-09-13 MED ORDER — PROPOFOL 1000 MG/100ML IV EMUL
INTRAVENOUS | Status: AC
  Filled 2022-09-13: qty 100

## 2022-09-13 MED ORDER — NOREPINEPHRINE 4 MG/250ML-% IV SOLN
2.0000 ug/min | INTRAVENOUS | Status: DC
  Administered 2022-09-13: 20 ug/min via INTRAVENOUS
  Filled 2022-09-13 (×2): qty 250

## 2022-09-13 MED ORDER — CALCIUM GLUCONATE-NACL 2-0.675 GM/100ML-% IV SOLN
2.0000 g | Freq: Once | INTRAVENOUS | Status: AC
  Administered 2022-09-13: 2000 mg via INTRAVENOUS
  Filled 2022-09-13: qty 100

## 2022-09-13 MED ORDER — INSULIN ASPART 100 UNIT/ML IV SOLN
10.0000 [IU] | Freq: Once | INTRAVENOUS | Status: AC
  Administered 2022-09-13: 10 [IU] via INTRAVENOUS
  Filled 2022-09-13: qty 0.1

## 2022-09-13 MED ORDER — FENTANYL BOLUS VIA INFUSION: 50.0000 ug | INTRAVENOUS | Status: DC | PRN

## 2022-09-13 MED ORDER — EPINEPHRINE HCL 5 MG/250ML IV SOLN IN NS
0.5000 ug/min | INTRAVENOUS | Status: DC
  Administered 2022-09-13: 0.5 ug/min via INTRAVENOUS
  Filled 2022-09-13: qty 250

## 2022-09-13 MED ORDER — EPINEPHRINE 1 MG/10ML IJ SOSY
10.0000 mg | PREFILLED_SYRINGE | Freq: Once | INTRAMUSCULAR | Status: AC
  Administered 2022-09-13: 10 mg via INTRAVENOUS

## 2022-09-13 MED ORDER — VASOPRESSIN 20 UNITS/100 ML INFUSION FOR SHOCK
0.0000 [IU]/min | INTRAVENOUS | Status: DC
  Administered 2022-09-13: 0.03 [IU]/min via INTRAVENOUS
  Filled 2022-09-13: qty 100

## 2022-09-13 MED ORDER — ONDANSETRON HCL 4 MG/2ML IJ SOLN
INTRAMUSCULAR | Status: DC | PRN
  Administered 2022-09-13: 4 mg via INTRAVENOUS

## 2022-09-13 MED ORDER — FENTANYL CITRATE PF 50 MCG/ML IJ SOSY
50.0000 ug | PREFILLED_SYRINGE | Freq: Once | INTRAMUSCULAR | Status: AC
  Administered 2022-09-13: 50 ug via INTRAVENOUS

## 2022-09-13 MED ORDER — LIDOCAINE HCL (CARDIAC) PF 100 MG/5ML IV SOSY
PREFILLED_SYRINGE | INTRAVENOUS | Status: DC | PRN
  Administered 2022-09-13: 80 mg via INTRAVENOUS

## 2022-09-13 MED ORDER — TRANEXAMIC ACID-NACL 1000-0.7 MG/100ML-% IV SOLN
1000.0000 mg | Freq: Once | INTRAVENOUS | Status: AC
  Administered 2022-09-13: 1000 mg via INTRAVENOUS
  Filled 2022-09-13: qty 100

## 2022-09-13 MED ORDER — DOCUSATE SODIUM 50 MG/5ML PO LIQD: 100.0000 mg | Freq: Two times a day (BID) | ORAL | Status: DC

## 2022-09-13 NOTE — ED Notes (Signed)
MD order to order emergency release blood PRBCS 2U and bolus in. Pt is still AxOx4, BP 74/40.

## 2022-09-13 NOTE — Procedures (Signed)
Cardiopulmonary Resuscitation Note  Tristan Bender  [id number]  March 28, 1961  Date:08/19/2022  Time:11:29 PM   Provider Performing:Katelinn Justice L [place]   Procedure: Cardiopulmonary Resuscitation (92950)  Indication(s) Loss of Pulse  Consent N/A  Anesthesia N/A   Time Out N/A   Sterile Technique Hand hygiene, gloves   Procedure Description Called to patient's room for CODE BLUE. Initial rhythm was Vfib/Vtach. Patient received high quality chest compressions for 3 minutes with defibrillation or cardioversion when appropriate. Epinephrine was administered every 3 minutes as directed by time Biomedical engineer. Additional pharmacologic interventions included calcium chloride and sodium bicarbonate, in addition to multiple vasopressor drips running above maximum rates.  Return of spontaneous circulation was achieved.  Family called and notified.   Complications/Tolerance N/A   EBL N/A   Specimen(s) N/A  Estimated time to ROSC: 3 minutes

## 2022-09-13 NOTE — Assessment & Plan Note (Addendum)
BPs 80s-90s in setting of uGIB w/ pRBC transfusion ongoing  Slowly uptrending  Noted baseline ESRD Consider midodrine  Hold home BP regimen

## 2022-09-13 NOTE — ED Triage Notes (Addendum)
BIBEMS, c/o N/V/D since 12 am. Coffee ground appearance. 4mg  of zofran IM given EMS. L arm restrict. VSS BGL: 221. PMH: dialysis T-TH-S. 96/04. HR 50. GCS 15. Denies blood thinner

## 2022-09-13 NOTE — Anesthesia Preprocedure Evaluation (Signed)
Anesthesia Evaluation  Patient identified by MRN, date of birth, ID band Patient awake    Reviewed: Allergy & Precautions, H&P , NPO status , Patient's Chart, lab work & pertinent test results  History of Anesthesia Complications Negative for: history of anesthetic complications  Airway Mallampati: II  TM Distance: >3 FB Neck ROM: full    Dental no notable dental hx.    Pulmonary sleep apnea , neg COPD, Patient abstained from smoking.Not current smoker   Pulmonary exam normal breath sounds clear to auscultation       Cardiovascular Exercise Tolerance: Good METShypertension, (-) CAD and (-) Past MI Normal cardiovascular exam(-) dysrhythmias  Rhythm:regular Rate:Normal     Neuro/Psych negative neurological ROS  negative psych ROS   GI/Hepatic PUD,neg GERD  ,,(+)     (-) substance abuse  Presented with hematemesis. Last hematemesis this morning, patient says he filled two blue emesis bags with dark red blood.   Endo/Other  neg diabetes    Renal/GU Dialysis and ESRFRenal diseaseLast dialyzed Saturday. 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 4 cm sessile duodenal polyp No date: Renal disorder     Comment:  on dialysis T, TH,SAT 08/26/2020: RUQ abdominal pain No date: Sleep apnea     Comment:  uses  CPAP (sometimes)  Reproductive/Obstetrics                              Anesthesia Physical Anesthesia Plan  ASA: 4  Anesthesia Plan: General   Post-op Pain Management: Minimal or no pain anticipated   Induction: Intravenous and Rapid sequence  PONV Risk Score and Plan: 2 and Treatment may vary due to age or medical condition, Ondansetron and Dexamethasone  Airway Management Planned: Oral ETT  Additional Equipment: None  Intra-op Plan:   Post-operative Plan: Extubation in OR  Informed Consent: I have reviewed the patients History and Physical, chart, labs and discussed the procedure including the risks, benefits and alternatives for the proposed anesthesia with the patient or authorized representative who has indicated his/her understanding and acceptance.     Dental Advisory Given  Plan Discussed with: CRNA  Anesthesia Plan Comments: (Discussed risks of anesthesia with patient, including PONV, sore throat, lip/dental/eye damage. Rare risks discussed as well, such as cardiorespiratory and neurological sequelae, and allergic reactions. Discussed the role of CRNA in patient's perioperative care. Patient understands.)         Anesthesia Quick Evaluation

## 2022-09-13 NOTE — Assessment & Plan Note (Signed)
Hgb 10.5-->8.1 in setting of acute blood loss anemia w/ UGIB S/p 2 u pRBC transfusion in ER  Trend hgb  Pending endoscopy  Transfuse for hgb <7

## 2022-09-13 NOTE — Progress Notes (Signed)
   09/07/2022 1956  Spiritual Encounters  Type of Visit Initial  Care provided to: Family  Referral source Nurse (RN/NT/LPN)  Reason for visit Urgent spiritual support  OnCall Visit Yes   Chaplain responded to request by ICU to provide support to daughter of patient. Chaplain sat with daughter and also talked by phone with her mother, providing comfort and compassionate presence. Chaplain sat with daughter for about 45 minutes and has revisited a number of times.

## 2022-09-13 NOTE — Progress Notes (Addendum)
Pharmacy Antibiotic Note  Tristan Bender is a 62 y.o. male admitted on 08/26/2022 with aspiration PNA.  Pharmacy has been consulted for Unasyn dosing.  Pt later intubated d/t respiratory failure.  ESRD with TTS HD schedule.   Plan: Unasyn 3 gm IV Q24H  Height: 5\' 7"  (170.2 cm) Weight: 67.9 kg (149 lb 11.1 oz) IBW/kg (Calculated) : 66.1  Temp (24hrs), Avg:97.7 F (36.5 C), Min:96.7 F (35.9 C), Max:98.3 F (36.8 C)  Recent Labs  Lab 08/26/2022 0859  WBC 21.0*  CREATININE 8.17*    Estimated Creatinine Clearance: 8.8 mL/min (A) (by C-G formula based on SCr of 8.17 mg/dL (H)).    Allergies  Allergen Reactions   Midazolam Other (See Comments)    Pt reports dropped his blood pressure very low Low BP     Antimicrobials this admission: 4/30 Unasyn >>   Dose adjustments this admission: N/A  Microbiology results: 4/30 MRSA PCR: not detected  Thank you for allowing pharmacy to be a part of this patient's care.  Manfred Shirts 08/31/2022 7:27 PM

## 2022-09-13 NOTE — Care Plan (Signed)
EGD showed esophagitis and anastomotic ulcerations. Also with one area of granular mucosa that was biopsied. Will need PPI BID and carafate. Can switch to PO but recommend breaking open the capsules when taking them. F/U in GI clinic. Please call with any questions or concerns.  Merlyn Lot MD, MPH [facility] GI

## 2022-09-13 NOTE — ED Notes (Signed)
Given report to ICU/stepdown nurse@this  time

## 2022-09-13 NOTE — Procedures (Signed)
Intubation Procedure Note  Tristan Bender  [id number]  May 26, 1960  Date:08/25/2022  Time:7:06 PM   Provider Performing:Copeland Lapier D Elvina Sidle    Procedure: Intubation (31500)  Indication(s) Respiratory Failure  Consent Unable to obtain consent due to emergent nature of procedure.   Anesthesia Versed and Fentanyl   Time Out Verified patient identification, verified procedure, site/side was marked, verified correct patient position, special equipment/implants available, medications/allergies/relevant history reviewed, required imaging and test results available.   Sterile Technique Usual hand hygeine, masks, and gloves were used   Procedure Description Patient positioned in bed supine.  Sedation given as noted above.  Patient was intubated with endotracheal tube using Glidescope.  View was Grade 1 full glottis .  Number of attempts was 1.  Colorimetric CO2 detector was consistent with tracheal placement.   Complications/Tolerance None; patient tolerated the procedure well. Chest X-ray is ordered to verify placement.   EBL Minimal   Specimen(s) None   Size 8.0 ETT Tube secured at 24 cm at lip   Harlon Ditty, AGACNP-BC [facility] Prefer epic messenger for cross cover needs If after hours, please call E-link

## 2022-09-13 NOTE — Anesthesia Procedure Notes (Signed)
Procedure Name: Intubation Date/Time: 08/25/2022 2:37 PM  Performed by: Morene Crocker, CRNAPre-anesthesia Checklist: Patient identified, Patient being monitored, Timeout performed, Emergency Drugs available and Suction available Patient Re-evaluated:Patient Re-evaluated prior to induction Oxygen Delivery Method: Circle system utilized Preoxygenation: Pre-oxygenation with 100% oxygen Induction Type: IV induction, Rapid sequence and Cricoid Pressure applied Laryngoscope Size: 3 and McGraph Grade View: Grade I Tube type: Oral Tube size: 7.0 mm Number of attempts: 1 Airway Equipment and Method: Stylet Placement Confirmation: ETT inserted through vocal cords under direct vision, positive ETCO2 and breath sounds checked- equal and bilateral Secured at: 22 cm Tube secured with: Tape Dental Injury: Teeth and Oropharynx as per pre-operative assessment  Comments: Smooth atraumatic intubation, no complications noted.

## 2022-09-13 NOTE — Progress Notes (Signed)
IV team consulted for USGPIV for vasopressor administration. Upon arrival to room, unit RN at bedside stated that central line had been placed on pt. Advised that if additional access was needed to place consult again to IV team.

## 2022-09-13 NOTE — ED Provider Notes (Addendum)
[facility] Provider Note    Event Date/Time   First MD Initiated Contact with Patient 08/22/2022 [phone]     (approximate)   History   Hematemesis and Hypotension   HPI  Tristan Bender is a 62 y.o. male past medical history significant for ESRD on HD TThSa (L AVF), history of GERD/PUD, chronic abdominal pain, history of gastric sleeve in 2021, who presents to the emergency department with hematemesis.  Patient states that last night he was sitting on a chair and started feeling nauseous.  Multiple episodes of vomiting and appeared to have coffee-ground emesis.  When EMS arrived reported that his systolic blood pressure was in the 50s, systolic blood pressure in the 60s prior to arrival.  Initial blood pressure when he arrived to the emergency department was 90 systolic.  Did not undergo dialysis today.  Not on anticoagulation but does have a remote history of atrial fibrillation.  Denies any lower abdominal pain.  Last bowel movement was 3 days ago.  Denies any alcohol use.  Chart review: EGD at [facility] on 11/2020 complicated by perforation, ex lap, jejunostomy with pyloric exclusion and placement of GJ tube with repair to duodenal perforation and omental patch, GJ tube removed and 2022.  History of hiatal hernia, upper endoscopy 08/2021 with esophageal ulcer managed with PPI.     Physical Exam   Triage Vital Signs: ED Triage Vitals [08/15/2022 0840]  Enc Vitals Group     BP 90/71     Pulse Rate (!) 119     Resp 18     Temp 98.3 F (36.8 C)     Temp Source Oral     SpO2 100 %     Weight      Height      Head Circumference      Peak Flow      Pain Score      Pain Loc      Pain Edu?      Excl. in [place]?     Most recent vital signs: Vitals:   08/19/2022 0947 08/26/2022 0957  BP: 97/73 (!) 91/56  Pulse: 99 98  Resp: 18 16  Temp: 98.2 F (36.8 C) 98.2 F (36.8 C)  SpO2:  100%    Physical Exam Constitutional:      General: He is in acute distress.      Appearance: He is well-developed.     Comments: Dried blood on his mouth that is bright red  HENT:     Head: Atraumatic.  Eyes:     Conjunctiva/sclera: Conjunctivae normal.  Cardiovascular:     Rate and Rhythm: Regular rhythm. Tachycardia present.  Pulmonary:     Effort: No respiratory distress.  Abdominal:     Comments: Multiple healed abdominal scars.  Abdomen is nontender to palpation.  Genitourinary:    Comments: Significant dark red blood per rectum Musculoskeletal:        General: Normal range of motion.     Cervical back: Normal range of motion.     Comments: Palpable thrill to the left AV fistula  Skin:    General: Skin is warm.     Capillary Refill: Capillary refill takes 2 to 3 seconds.  Neurological:     Mental Status: He is alert. Mental status is at baseline.  Psychiatric:        Mood and Affect: Mood normal.     IMPRESSION / MDM / ASSESSMENT AND PLAN / ED COURSE  I reviewed  the triage vital signs and the nursing notes.  Differential diagnosis including significant upper GI bleed, gastritis/PUD given his known peptic ulcer disease, duodenitis given his known duodenal perforation with repair, bowel obstruction.  On chart review not on anticoagulation.  Does have remote history of atrial fibrillation.  According to EMS patient's blood pressure was low with a systolic blood pressure of 50 and 60.  On arrival to the emergency department patient's systolic blood pressure is 90 and significantly tachycardic.  Patient was typed and screened.  Hemoglobin recently 11.3  Called into the room because patient's blood pressure was 60 systolic.  Was given 2 units of emergency release blood.  Typed and crossed for 2 units.  On chart review previously required 10 units PRBC.   EKG  I, Corena Herter, the attending physician, personally viewed and interpreted this ECG.  EKG with sinus tachycardia with a rate of 117.  Normal intervals.  No chamber enlargement.  T waves inverted  to the septal lead which is unchanged when compared to prior EKG.  Increased rate when compared to prior EKG.  Unchanged right bundle branch block.  Sinus tachycardia while on cardiac telemetry.  RADIOLOGY I independently reviewed imaging, my interpretation of imaging: Chest x-ray upright -no free air under the diaphragm.  Read as no acute findings.  LABS (all labs ordered are listed, but only abnormal results are displayed) Labs interpreted as -    Labs Reviewed  CBC - Abnormal; Notable for the following components:      Result Value   WBC 21.0 (*)    RBC 3.31 (*)    Hemoglobin 8.1 (*)    HCT 29.3 (*)    MCH 24.5 (*)    MCHC 27.6 (*)    RDW 18.2 (*)    All other components within normal limits  COMPREHENSIVE METABOLIC PANEL - Abnormal; Notable for the following components:   Glucose, Bld 137 (*)    BUN 48 (*)    Creatinine, Ser 8.17 (*)    Calcium 6.7 (*)    Albumin 2.5 (*)    GFR, Estimated 7 (*)    Anion gap 16 (*)    All other components within normal limits  LIPASE, BLOOD - Abnormal; Notable for the following components:   Lipase 90 (*)    All other components within normal limits  PROTIME-INR - Abnormal; Notable for the following components:   Prothrombin Time 16.2 (*)    INR 1.3 (*)    All other components within normal limits  PHOSPHORUS - Abnormal; Notable for the following components:   Phosphorus 6.2 (*)    All other components within normal limits  MAGNESIUM  TYPE AND SCREEN  PREPARE RBC (CROSSMATCH)  PREPARE RBC (CROSSMATCH)  PREPARE RBC (CROSSMATCH)     MDM  On arrival to the emergency department patient is ill-appearing with concern for significant upper GI bleed.  Initial blood pressure 60 systolic.  Given 2 units of emergency release PRBC.  Typed and crossed for 2 units.  Given IV Protonix.  Emergently consulted gastroenterology.  Clinical Course as of 09/01/2022 1000  Tue Sep 13, 2022  4098 Consulted and discussed with Dr. Mia Creek with  gastroenterology, recommended resuscitation and they will come in to evaluate him in the emergency department [SM]  [phone] Repeat blood pressure improved after 1 unit of emergency release blood products.  Given a second unit PRBC over 1 hour.  Hemoglobin resulted at 8.1 from a baseline of 11 on most recent lab work.  Only mildly elevated BUN do not feel that the patient would benefit from DDAVP at this time.  Consulted hospitalist for admission. [SM]    Clinical Course User Index [SM] Corena Herter, MD     PROCEDURES:  Critical Care performed: yes  .Critical Care  Performed by: Corena Herter, MD Authorized by: Corena Herter, MD   Critical care provider statement:    Critical care time (minutes):  50   Critical care time was exclusive of:  Separately billable procedures and treating other patients   Critical care was necessary to treat or prevent imminent or life-threatening deterioration of the following conditions:  Shock   Critical care was time spent personally by me on the following activities:  Development of treatment plan with patient or surrogate, discussions with consultants, evaluation of patient's response to treatment, examination of patient, ordering and review of laboratory studies, ordering and review of radiographic studies, ordering and performing treatments and interventions, pulse oximetry, re-evaluation of patient's condition and review of old charts  PROCEDURE NOTE: IV Placement under Ultrasound Guidance Performed by: Corena Herter, MD (myself) Indication: IV access required. Multiple attempts at peripheral IV placement were made by the nursing staff without success   Procedure: The area was prepped in the usual fashion. The right forearm was cannulated with a 20 gauge angiocath with use of dynamic  ultrasound to identify the vein. The patient tolerated the procedure  well. Imaging not saved.  Complications: none  Patient's presentation is most consistent with acute  presentation with potential threat to life or bodily function.   MEDICATIONS ORDERED IN ED: Medications  0.9 %  sodium chloride infusion (Manually program via Guardrails IV Fluids) (0 mLs Intravenous Hold 09/06/2022 0958)  0.9 %  sodium chloride infusion (Manually program via Guardrails IV Fluids) (0 mLs Intravenous Hold 08/31/2022 0959)  pantoprazole (PROTONIX) 80 mg /NS 100 mL IVPB (80 mg Intravenous New Bag/Given 09/11/2022 0939)  sodium chloride 0.9 % bolus 250 mL (0 mLs Intravenous Stopped 08/22/2022 0939)  0.9 %  sodium chloride infusion ( Intravenous New Bag/Given 08/20/2022 0959)    FINAL CLINICAL IMPRESSION(S) / ED DIAGNOSES   Final diagnoses:  Upper GI bleed  Hemorrhagic shocks (HCC)     Rx / DC Orders   ED Discharge Orders     None        Note:  This document was prepared using Dragon voice recognition software and may include unintentional dictation errors.   Corena Herter, MD 08/28/2022 1000    Corena Herter, MD 08/24/2022 1001

## 2022-09-13 NOTE — Consult Note (Cosign Needed Addendum)
NAME:  Tristan Bender, MRN:  [id number], DOB:  01-25-1961, LOS: 0 ADMISSION DATE:  08/23/2022, CONSULTATION DATE:  09/08/2022 REFERRING MD:  Dr. Mia Creek, CHIEF COMPLAINT:  Unresponsive, agonal respirations   Brief Pt Description / Synopsis:  62 year old male admitted with acute blood loss anemia in the setting of acute upper GI bleed.  GI performed EGD which showed esophagitis with no active bleed, along with ulceration a previous Billroth II gastrojejunostomy which was transversed.  Hospital course complicated by development of hemorrhagic shock from GI bleed with subsequent acute hypoxic respiratory failure and unresponsiveness from asphyxiation of blood requiring intubation for airway protection.  History of Present Illness:  Tristan Bender is a 62 y.o. male with medical history significant of end-stage renal disease on hemodialysis Tuesday Thursday Saturday, complicated GI history including history of adenomatous duodenal polyp status post removal, chronic duodenal ulcer with hemorrhage, history of duodenal perforation, sleep apnea presenting with upper GI bleeding.  Patient reports 2 episodes of very large bloody emesis.  Episode was sudden onset.  Denies any abdominal pain, nausea vomiting or malaise prior to symptoms.  Noted history of duodenal ulcer with hemorrhage as well as adenomatous polyp and duodenal perforation associate with endoscopy in the past.  Patient denies any NSAID or alcohol use.  Has been compliant with home Prilosec and Carafate.  No chest pain or shortness of breath.  Denies any recent strenuous activity.  No antiplatelet use.   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  -[facility] 2022 and gastric sleeve history 2021  ED Course: Initial Vital Signs: afebrile, blood pressure initially in the 60s to 80s. Received 2 units of emergency blood with blood pressures improving to the 80s to 90s with transfusion. Satting well on room air.  Significant Labs: Hemoglobin 8.1 with baseline hemoglobin around 10-11. Platelets 241, creatinine 9.1, lipase 90.  Imaging Chest X-ray>>FINDINGS: No pleural effusion. No pneumothorax. No focal airspace opacity. No radiographically apparent displaced rib fractures. Normal cardiac and mediastinal contours. Visualized upper abdomen is unremarkable.   He was admitted by the Hospitalist for further workup and treatment.  GI was consulted with plans to perform EGD.  Please see "Significant Hospital Events" section below for full detailed hospital course.  Pertinent  Medical History   Past Medical History:  Diagnosis Date   Adenomatous duodenal polyp    jan 2018- [facility]   Anemia    Bile leak 01/05/2021   Biliary colic 08/20/2020   Chronic duodenal ulcer with hemorrhage    Complication of anesthesia    had to abort previous colonoscopy due to vomiting   Duodenal perforation (HCC) 01/05/2021   GI bleed 06/30/2016   Hematemesis with nausea    Hemoptysis 09/06/2020   Hypertension    Polyp of intestine 09/09/2016   Formatting of this note might be different from the original. 4 cm sessile duodenal polyp   Renal disorder    on dialysis T, TH,SAT   RUQ abdominal pain 08/26/2020   Sleep apnea    uses CPAP (sometimes)     Micro Data:  4/30:  MRSA PCR>> negative 4/30: Tracheal aspirate>>  Antimicrobials:  Unasyn 4/30>>  Significant Hospital Events: Including procedures, antibiotic start and stop dates in addition to other  pertinent events   4/30: Admitted by hospitalist for acute GI bleed, GI consulted.  EGD performed which showed: LA Grade D (one or more mucosal breaks involving at least 75% of  esophageal circumference) esophagitis with no bleeding was found.  Evidence of a patent Billroth II gastrojejunostomy was found. The gastrojejunal anastomosis was characterized by ulceration and visible sutures. This was traversed. The efferent limb was examined. The afferent limb was examined. Localized granular mucosa was found at the anastomosis. Biopsies were taken with a cold forceps for histology. Estimated blood loss was minimal. The examined jejunum was normal."  He returned to ICU post procedure 4/30:  Became unresponsive with agonal respirations, aspirating large volume of hematemesis with large clots, along with hematochezia.  Emergently intubated, with Hemorrhagic shock, to receive 4 units of pRBC's and 2 FFP.  Discussed with GI, obtain STAT CT angiogram Abdomen, updated Vascular Surgery that made need embolization depending on results of CTA.  Interim History / Subjective:  -Patient was admitted earlier today by hospitalist for acute upper GI bleed -GI performed EGD and found esophagitis with no active bleeding, ulceration a previous Billroth II gastrojejunostomy which was transversed. -He returned to ICU postprocedure, initially was stable -He suddenly became unresponsive with blank stare, noted to have agonal respirations -Decision made to emergently intubate ~with intubation he was aspirating copious amounts of hematemesis with large clots, also noted to have heme ataxia -Central line being emergently placed, ordered for 4 units of blood and 2 of FFP -Workup is currently pending including CBC, BMP, DIC panel -Discussed with Dr. Mia Creek of GI who recommended CT abdomen stat ~did discuss with Dr. Wyn Quaker of Vascular surgery to give him a heads up in the event that embolization would be to be needed based on CTA  results  Objective   Blood pressure (!) 87/66, pulse (!) 127, temperature (!) 96.8 F (36 C), temperature source Temporal, resp. rate 17, height 5\' 7"  (1.702 m), weight 67.9 kg, SpO2 100 %.    Vent Mode: PRVC FiO2 (%):  [28 %] 28 % Set Rate:  [16 bmp] 16 bmp Vt Set:  [500 mL] 500 mL PEEP:  [5 cmH20] 5 cmH20   Intake/Output Summary (Last 24 hours) at 08/24/2022 1907 Last data filed at 08/27/2022 1900 Gross per 24 hour  Intake 209.46 ml  Output --  Net 209.46 ml   Filed Weights   09/04/2022 1221  Weight: 67.9 kg    Examination: General: Critically ill-appearing male, laying in bed, with agonal respirations, unresponsive HENT: Atraumatic, normocephalic, neck supple, no JVD Lungs: Coarse breath sounds throughout, agonal respirations Cardiovascular: Tachycardia, regular rhythm, S1-S2, no murmurs, rubs, gallops Abdomen: Soft, nontender, nondistended, no guarding rebound tenderness, bowel sounds hypoactive Extremities: Normal bulk and tone, no deformities, no edema Neuro: Unresponsive, does not respond to painful stimuli, pupils PERRLA GU: Deferred  Resolved Hospital Problem list     Assessment & Plan:   #Hemorrhagic shock  PMHx: HTN, Cardiomyopathy -Continuous cardiac monitoring -Maintain MAP >65 -IV fluids -Transfusions as indicated -Vasopressors as needed to maintain MAP goal -Trend lactic acid until normalized -Trend HS Troponin until peaked -Echocardiogram pending  #Acute Blood Loss Anemia in setting of Acute Upper GI Bleed PMHx: Anemia, GI bleed S/p EGD 08/23/2022: A Grade D (one or more mucosal breaks involving at least 75% of  esophageal circumference) esophagitis with  no bleeding was found.  Evidence of a patent Billroth II gastrojejunostomy was found. The gastrojejunal anastomosis was characterized by ulceration and visible sutures. This was traversed. The efferent limb was examined. The afferent limb was examined. Localized granular mucosa was found at the  anastomosis. Biopsies were taken with a cold forceps for histology. Estimated blood loss was minimal. The examined jejunum was normal." -Monitor for S/Sx of bleeding -Trend CBC (H&H q6h) -SCD's for VTE Prophylaxis  -Transfuse for Hgb <8 -Will give 4 units of emergent blood and 2 units FFP  -Will give Tranexamic acid -Check DIC panel -Protonix gtt -GI following, appreciate input ~discussed with Dr. Mia Creek who recommends obtaining CT angiogram stat -Discussed with vascular surgery potential need for embolization depending on CT angiogram results  #Acute Hypoxic Respiratory Failure in setting of aspiration #Intubated for airway protection PMHx: OSA -Full vent support, implement lung protective strategies -Plateau pressures less than 30 cm H20 -Wean FiO2 & PEEP as tolerated to maintain O2 sats >92% -Follow intermittent Chest X-ray & ABG as needed -Spontaneous Breathing Trials when respiratory parameters met and mental status permits -Implement VAP Bundle -Prn Bronchodilators -ABX as above  #ESRD on Hemodialysis -Monitor I&O's / urinary output -Follow BMP -Ensure adequate renal perfusion -Avoid nephrotoxic agents as able -Replace electrolytes as indicated -Nephrology following ~ HD vs CRRT as per Nephrology  #Unresponsive/Acute Metabolic Encephalopathy, suspect in setting of asphyxiation of blood #Sedation needs in setting of mechanical ventilation -Maintain a RASS goal of 0 to -1 -Fentanyl and Propofol as needed to maintain RASS goal -Avoid sedating medications as able -Daily wake up assessment -Obtain CT Head and EEG -Check UDS    Patient is critically ill.  Prognosis is extremely guarded, high risk for further decompensation, cardiac arrest and death.  Best Practice (right click and "Reselect all SmartList Selections" daily)   Diet/type: NPO DVT prophylaxis: SCD (no chemical prophylaxis due to acute GI bleed) GI prophylaxis: PPI Lines: N/A Foley:  N/A Code Status:   full code Last date of multidisciplinary goals of care discussion [N/A]  Labs   CBC: Recent Labs  Lab 08/27/2022 0859 09/06/2022 1316  WBC 21.0*  --   HGB 8.1* 9.3*  HCT 29.3* 30.4*  MCV 88.5  --   PLT 241  --     Basic Metabolic Panel: Recent Labs  Lab 08/31/2022 0859  NA 138  K 4.1  CL 98  CO2 24  GLUCOSE 137*  BUN 48*  CREATININE 8.17*  CALCIUM 6.7*  MG 1.7  PHOS 6.2*   GFR: Estimated Creatinine Clearance: 8.8 mL/min (A) (by C-G formula based on SCr of 8.17 mg/dL (H)). Recent Labs  Lab 08/17/2022 0859  WBC 21.0*    Liver Function Tests: Recent Labs  Lab 09/06/2022 0859  AST 24  ALT 10  ALKPHOS 68  BILITOT 0.9  PROT 6.5  ALBUMIN 2.5*   Recent Labs  Lab 08/28/2022 0859  LIPASE 90*   No results for input(s): "AMMONIA" in the last 168 hours.  ABG No results found for: "PHART", "PCO2ART", "PO2ART", "HCO3", "TCO2", "ACIDBASEDEF", "O2SAT"   Coagulation Profile: Recent Labs  Lab 09/01/2022 0859  INR 1.3*    Cardiac Enzymes: No results for input(s): "CKTOTAL", "CKMB", "CKMBINDEX", "TROPONINI" in the last 168 hours.  HbA1C: No results found for: "HGBA1C"  CBG: Recent Labs  Lab 09/06/2022 1258  GLUCAP 84    Review of Systems:   Unable to assess due to AMS/intubation/sedation/critical illness   Past Medical History:  He,  has  a past medical history of Adenomatous duodenal polyp, Anemia, Bile leak (01/05/2021), Biliary colic (08/20/2020), Chronic duodenal ulcer with hemorrhage, Complication of anesthesia, Duodenal perforation (HCC) (01/05/2021), GI bleed (06/30/2016), Hematemesis with nausea, Hemoptysis (09/06/2020), Hypertension, Polyp of intestine (09/09/2016), Renal disorder, RUQ abdominal pain (08/26/2020), and Sleep apnea.   Surgical History:   Past Surgical History:  Procedure Laterality Date   ABDOMINAL SURGERY     CHOLECYSTECTOMY     COLON SURGERY     COLONOSCOPY WITH PROPOFOL N/A 07/08/2021   Procedure: COLONOSCOPY WITH PROPOFOL;  Surgeon:  Toney Reil, MD;  Location: [facility];  Service: Endoscopy;  Laterality: N/A;  sleep apnea needs potassium draw at arrival   DIALYSIS/PERMA CATHETER INSERTION N/A 06/30/2016   Procedure: Dialysis/Perma Catheter Insertion;  Surgeon: Annice Needy, MD;  Location: [facility] INVASIVE CV LAB;  Service: Cardiovascular;  Laterality: N/A;   ESOPHAGOGASTRODUODENOSCOPY (EGD) WITH PROPOFOL N/A 07/01/2016   Procedure: ESOPHAGOGASTRODUODENOSCOPY (EGD) WITH PROPOFOL;  Surgeon: Midge Minium, MD;  Location: [facility] ENDOSCOPY;  Service: Endoscopy;  Laterality: N/A;   ESOPHAGOGASTRODUODENOSCOPY (EGD) WITH PROPOFOL N/A 09/07/2020   Procedure: ESOPHAGOGASTRODUODENOSCOPY (EGD) WITH PROPOFOL;  Surgeon: Toney Reil, MD;  Location: [facility] ENDOSCOPY;  Service: Gastroenterology;  Laterality: N/A;   ESOPHAGOGASTRODUODENOSCOPY (EGD) WITH PROPOFOL N/A 09/09/2021   Procedure: ESOPHAGOGASTRODUODENOSCOPY (EGD) WITH PROPOFOL;  Surgeon: Toney Reil, MD;  Location: [facility];  Service: Endoscopy;  Laterality: N/A;   LAPAROSCOPIC GASTRIC SLEEVE RESECTION     LEFT HEART CATH AND CORONARY ANGIOGRAPHY N/A 04/15/2022   Procedure: LEFT HEART CATH AND CORONARY ANGIOGRAPHY;  Surgeon: Laurier Nancy, MD;  Location: [facility] INVASIVE CV LAB;  Service: Cardiovascular;  Laterality: N/A;     Social History:   reports that he has never smoked. He has never used smokeless tobacco. He reports that he does not drink alcohol and does not use drugs.   Family History:  His family history includes Renal Disease in his maternal uncle.   Allergies Allergies  Allergen Reactions   Midazolam Other (See Comments)    Pt reports dropped his blood pressure very low Low BP      Home Medications  Prior to Admission medications   Medication Sig Start Date End Date Taking? Authorizing Provider  B Complex-C-Folic Acid (RENA-VITE RX) 1 MG TABS Take 1 tablet by mouth daily. Patient not taking: Reported on 04/15/2022    [provider]  Cholecalciferol (VITAMIN D PO) Take 1.25 mcg by mouth daily. Takes before dialysis T-Th-S Patient not taking: Reported on 04/15/2022    [provider]  famotidine (PEPCID) 20 MG tablet Take 1 tablet (20 mg total) by mouth 2 (two) times daily for 15 days. 11/18/21 12/03/21  Sharman Cheek, MD  folic acid (FOLVITE) 1 MG tablet TAKE 1 TABLET(1 MG) BY MOUTH DAILY Patient not taking: Reported on 04/15/2022 07/21/21   Toney Reil, MD  lidocaine (XYLOCAINE) 2 % solution Use as directed 15 mLs in the mouth or throat every 6 (six) hours as needed for mouth pain. Patient not taking: Reported on 04/15/2022 10/18/21   Toney Reil, MD  metoCLOPramide (REGLAN) 10 MG tablet Take 1 tablet (10 mg total) by mouth every 6 (six) hours as needed. Patient not taking: Reported on 04/15/2022 11/18/21   Sharman Cheek, MD  omeprazole (PRILOSEC) 40 MG capsule Take 1 capsule (40 mg total) by mouth 2 (two) times daily before a meal. 09/09/21 01/07/22  Vanga, Loel Dubonnet, MD  ondansetron (ZOFRAN-ODT) 4 MG disintegrating tablet Take by  mouth daily. 08/22/21   [provider]  Oxycodone HCl 10 MG TABS Take 10 mg by mouth daily. Patient not taking: Reported on 04/15/2022 10/04/21   [provider]  pregabalin (LYRICA) 75 MG capsule Take 75 mg by mouth 2 (two) times daily. 10/14/21   [provider]  sucralfate (CARAFATE) 1 g tablet Take 1 tablet (1 g total) by mouth 4 (four) times daily. 11/18/21   Sharman Cheek, MD  sucroferric oxyhydroxide (VELPHORO) 500 MG chewable tablet CRUSH OR CHEW AND SWALLOW 1 TABLET 3 TIMES A DAY WITH MEALS Patient not taking: Reported on 09/02/2021 07/23/20   [provider]     Critical care time: 55 minutes     Harlon Ditty, AGACNP-BC [facility] Prefer epic messenger for cross cover needs If after hours, please call E-link

## 2022-09-13 NOTE — Procedures (Signed)
Central Venous Catheter Insertion Procedure Note  Tristan Bender  [id number]  03/25/1961  Date:09/06/2022  Time:8:58 PM   Provider Performing:[name]   Procedure: Insertion of Non-tunneled Central Venous Catheter(36556) with US guidance (11914)   Indication(s) Medication administration  Consent Unable to obtain consent due to emergent nature of procedure.  Anesthesia Topical only with 1% lidocaine , fentanyl and propofol infusing  Timeout Verified patient identification, verified procedure, site/side was marked, verified correct patient position, special equipment/implants available, medications/allergies/relevant history reviewed, required imaging and test results available.  Sterile Technique Maximal sterile technique including full sterile barrier drape, hand hygiene, sterile gown, sterile gloves, mask, hair covering, sterile ultrasound probe cover (if used).  Procedure Description Area of catheter insertion was cleaned with chlorhexidine and draped in sterile fashion.  With real-time ultrasound guidance a central venous catheter was placed into the left internal jugular vein. Nonpulsatile blood flow and easy flushing noted in all ports.  The catheter was sutured in place and sterile dressing applied.  Complications/Tolerance None; patient tolerated the procedure well. Chest X-ray is ordered to verify placement for internal jugular or subclavian cannulation.   Chest x-ray is not ordered for femoral cannulation.  EBL Oozing significantly- surgi-seal placed on site. Patient is also having severe hematemesis & hematochezia  Specimen(s) None   Betsey Holiday, AGACNP-BC Acute Care Nurse Practitioner  Pulmonary & Critical Care   [phone] / [phone] Please see Amion for pager details.

## 2022-09-13 NOTE — Care Plan (Signed)
EGD just with a large amount of clot and bleeding. Unable to visualize anything so procedure was aborted. Spoke with ex-wife and daughter to update on the situation and have jointly decided to focus on comfort. This was confirmed with NP Rust-Chester.  Merlyn Lot MD, MPH [facility] GI

## 2022-09-13 NOTE — Op Note (Signed)
[facility] Patient Name: Tristan Bender Procedure Date: 08/27/2022 10:41 PM MRN: [id number] Account #: [id number] Date of Birth: [birth year] Admit Type: Inpatient Age: 62 Room: [facility] ENDO ROOM 4 Gender: Male Note Status: Finalized Instrument Name: Upper Endoscope [id number] Procedure:             Upper GI endoscopy Indications:           Active gastrointestinal bleeding Providers:             Eather Colas MD, MD Medicines:             General Anesthesia Complications:         No immediate complications. Procedure:             Pre-Anesthesia Assessment:                        - Prior to the procedure, a History and Physical was                         performed, and patient medications and allergies were                         reviewed. The patient is unable to give consent                         secondary to the patient being legally incompetent to                         consent. The risks and benefits of the procedure and                         the sedation options and risks were discussed with the                         patient. All questions were answered and informed                         consent was obtained. Patient identification and                         proposed procedure were verified by the physician, the                         nurse and the technician in the procedure room. Mental                         Status Examination: obtunded. Airway Examination:                         orotracheal intubation. CV Examination: tachycardia                         noted. Prophylactic Antibiotics: The patient does not                         require prophylactic antibiotics. Prior                         Anticoagulants: The patient has taken no anticoagulant  or antiplatelet agents. ASA Grade Assessment: E -                         Emergency. After reviewing the risks and benefits, the                         patient  was deemed in satisfactory condition to                         undergo the procedure. The anesthesia plan was to use                         general anesthesia. Immediately prior to                         administration of medications, the patient was                         re-assessed for adequacy to receive sedatives. The                         heart rate, respiratory rate, oxygen saturations,                         blood pressure, adequacy of pulmonary ventilation, and                         response to care were monitored throughout the                         procedure. The physical status of the patient was                         re-assessed after the procedure.                        After obtaining informed consent, the endoscope was                         passed under direct vision. Throughout the procedure,                         the patient's blood pressure, pulse, and oxygen                         saturations were monitored continuously. The Endoscope                         was introduced through the mouth, with the intention                         of advancing to the stomach. The scope was advanced to                         the gastric body before the procedure was aborted.                         Medications were given. The upper GI endoscopy was  performed with difficulty due to excessive bleeding.                         The procedure was aborted due to excessive bleeding. Findings:      Red blood was found in the entire esophagus.      Red blood was found in the stomach. Impression:            - The procedure was aborted due to excessive bleeding.                        - Red blood in the esophagus.                        - Red blood in the stomach.                        - No specimens collected. Recommendation:        - Return patient to ICU.                        - Given severe hypotension, ongoing bleeding                          refractory to resuscitation, and being unstable. Spoke                         with family along with ICU team and patient's code                         status was changed to DNR and focus on comfort. Procedure Code(s):     --- Professional ---                        [phone], 52, Esophagogastroduodenoscopy, flexible,                         transoral; diagnostic, including collection of                         specimen(s) by brushing or washing, when performed                         (separate procedure) Diagnosis Code(s):     --- Professional ---                        K22.89, Other specified disease of esophagus                        K92.2, Gastrointestinal hemorrhage, unspecified CPT copyright 2022 American [employer] Association. All rights reserved. The codes documented in this report are preliminary and upon coder review may  be revised to meet current compliance requirements. Eather Colas MD, MD 08/26/2022 11:30:44 PM Number of Addenda: 0 Note Initiated On: 08/16/2022 10:41 PM Estimated Blood Loss:  Estimated blood loss: none.      [facility]

## 2022-09-13 NOTE — Transfer of Care (Signed)
Immediate Anesthesia Transfer of Care Note  Patient: Tristan Bender  Procedure(s) Performed: ESOPHAGOGASTRODUODENOSCOPY (EGD)  Patient Location: Endoscopy Unit  Anesthesia Type:General  Level of Consciousness: drowsy  Airway & Oxygen Therapy: Patient Spontanous Breathing  Post-op Assessment: Report given to RN and Post -op Vital signs reviewed and stable  Post vital signs: Reviewed and stable  Last Vitals:  Vitals Value Taken Time  BP 97/54 09/09/2022 1502  Temp 36 C 08/31/2022 1501  Pulse 87 08/28/2022 1506  Resp 14 09/01/2022 1506  SpO2 100 % 09/04/2022 1506  Vitals shown include unvalidated device data.  Last Pain:  Vitals:   08/15/2022 1501  TempSrc: Temporal  PainSc: Asleep         Complications: No notable events documented.

## 2022-09-13 NOTE — Progress Notes (Addendum)
Pt requested to speak with MD regarding blood draws and pain med regimen. Md paged and called RN phone. Pt spoke with MD on phone and agreed to blood draws. Immediately after hanging up phone with MD pt went unresponsive. ICU NP and charge RN called to bedside. Pt with agonal breathing and unarousable. When pts mouth was opened to prepare to intubate a large amount of bright red blood was seen in back of mouth. Pt intubated with no complications. Pts BP began to drop and vasopressors started and an emergent central line was placed. Pt continued to have large amounts of bright red blood from mouth and also from rectum. Additional nurses came to bedside to assist. Pt receiving emergent blood transfusions and FFP at this time. BP continues to drop and pt not responding to additional vasopressors being added. Plan for STAT CTA when stable to transport. Family updated by NP and in waiting room awaiting further information.

## 2022-09-13 NOTE — ED Notes (Signed)
Verbal order by MD Mumma to run 2 emergency release [facility] over 1 hour.

## 2022-09-13 NOTE — Progress Notes (Signed)
Terminally extubated patient per NP.

## 2022-09-13 NOTE — Assessment & Plan Note (Signed)
2D ECHO 04/2022 w/ EF 50-55%  Monitor volume status w/ pRBC transfusion and pending HD Follow

## 2022-09-13 NOTE — Op Note (Signed)
[facility] Patient Name: Tristan Bender Procedure Date: 08/18/2022 2:28 PM MRN: [id number] Account #: [id number] Date of Birth: [birth year] Admit Type: Inpatient Age: 62 Room: [facility] ENDO ROOM 3 Gender: Male Note Status: Finalized Instrument Name: Patton Salles Endoscope [id number] Procedure:             Upper GI endoscopy Indications:           Hematemesis Providers:             Eather Colas MD, MD Referring MD:          Silas Flood. Ellsworth Lennox, MD (Referring MD) Medicines:             Monitored Anesthesia Care Complications:         No immediate complications. Estimated blood loss:                         Minimal. Procedure:             Pre-Anesthesia Assessment:                        - Prior to the procedure, a History and Physical was                         performed, and patient medications and allergies were                         reviewed. The patient is competent. The risks and                         benefits of the procedure and the sedation options and                         risks were discussed with the patient. All questions                         were answered and informed consent was obtained.                         Patient identification and proposed procedure were                         verified by the physician, the nurse, the                         anesthesiologist, the anesthetist and the technician                         in the endoscopy suite. Mental Status Examination:                         alert and oriented. Airway Examination: normal                         oropharyngeal airway and neck mobility. Respiratory                         Examination: clear to auscultation. CV Examination:  normal. Prophylactic Antibiotics: The patient does not                         require prophylactic antibiotics. Prior                         Anticoagulants: The patient has taken no anticoagulant                          or antiplatelet agents. ASA Grade Assessment: III - A                         patient with severe systemic disease. After reviewing                         the risks and benefits, the patient was deemed in                         satisfactory condition to undergo the procedure. The                         anesthesia plan was to use general anesthesia.                         Immediately prior to administration of medications,                         the patient was re-assessed for adequacy to receive                         sedatives. The heart rate, respiratory rate, oxygen                         saturations, blood pressure, adequacy of pulmonary                         ventilation, and response to care were monitored                         throughout the procedure. The physical status of the                         patient was re-assessed after the procedure.                        After obtaining informed consent, the endoscope was                         passed under direct vision. Throughout the procedure,                         the patient's blood pressure, pulse, and oxygen                         saturations were monitored continuously. The Endoscope                         was introduced through the mouth, and advanced to the  afferent and efferent jejunal loops. The upper GI                         endoscopy was accomplished without difficulty. The                         patient tolerated the procedure well. Findings:      LA Grade D (one or more mucosal breaks involving at least 75% of       esophageal circumference) esophagitis with no bleeding was found.      Evidence of a patent Billroth II gastrojejunostomy was found. The       gastrojejunal anastomosis was characterized by ulceration and visible       sutures. This was traversed. The efferent limb was examined. The       afferent limb was examined.      Localized granular mucosa was found at the  anastomosis. Biopsies were       taken with a cold forceps for histology. Estimated blood loss was       minimal.      The examined jejunum was normal. Impression:            - LA Grade D reflux esophagitis with no bleeding.                        - Patent Billroth II gastrojejunostomy was found,                         characterized by visible sutures and ulceration.                        - Granular gastric mucosa. Biopsied.                        - Normal examined jejunum. Recommendation:        - Return patient to hospital ward for ongoing care.                        - Advance diet as tolerated.                        - Continue present medications.                        - Await pathology results.                        - Return to GI clinic at appointment to be scheduled. Procedure Code(s):     --- Professional ---                        [phone], Esophagogastroduodenoscopy, flexible,                         transoral; with biopsy, single or multiple Diagnosis Code(s):     --- Professional ---                        K21.00, Gastro-esophageal reflux disease with                         esophagitis,  without bleeding                        Z98.0, Intestinal bypass and anastomosis status                        K31.89, Other diseases of stomach and duodenum                        K92.0, Hematemesis CPT copyright 2022 American Medical Association. All rights reserved. The codes documented in this report are preliminary and upon coder review may  be revised to meet current compliance requirements. Eather Colas MD, MD 09/12/2022 2:59:58 PM Number of Addenda: 0 Note Initiated On: 08/21/2022 2:28 PM Estimated Blood Loss:  Estimated blood loss was minimal.      [facility]

## 2022-09-13 NOTE — Assessment & Plan Note (Signed)
HD TTS  Due for HD  Will reach to nephrology to coordinate

## 2022-09-13 NOTE — IPAL (Signed)
  Interdisciplinary Goals of Care Family Meeting   Date carried out: 08/21/2022  Location of the meeting: Phone conference  Member's involved: Physician, Nurse Practitioner, and Family Member or next of kin  Durable Power of Attorney or acting medical decision maker: Spoke with the patient's daughter Dorathy Daft and ex-wife via phone conference. There was no other family contact information available.    Discussion: We discussed goals of care for [facility]. Family had been made aware previously of the patient's grave prognosis due to severe suspected GI hemorrhage and circulatory shock. The patient was not a candidate for general surgery or IR intervention. Gastroenterology attempted to perform emergent bedside EGD but was unable to visualize any area to intervene. Patient briefly lost his pulse requiring 3 minutes of CPR and ACLS, while receiving maximum vasopressor support. Due to the massive GI hemorrhage that is not amenable to intervention, family wanted to transition to comfort measures only.  Code status:   Code Status: DNR   Disposition: In-patient comfort care  Time spent for the meeting: 15 minutes    Cecelia Byars Rust-Chester, NP  08/15/2022, 11:21 PM  Cheryll Cockayne Rust-Chester, AGACNP-BC Acute Care Nurse Practitioner [facility]   [phone] / [phone] Please see Amion for pager details.

## 2022-09-13 NOTE — Progress Notes (Signed)
Hemorrhagic Shock CT angio abdomen & pelvis 09/12/2022: Significant attenuation of arterial and venous branches related to severe hypotension. Findings consistent with acute GI hemorrhage within the stomach although the exact site of acute hemorrhage is difficult to assess due to the widespread contrast extravasation. Considerable filling defect is noted within the stomach and proximal small bowel likely related to thrombus.  Difficult portal venous air with geographic enhancement normality is in the liver likely related to poor flow in the degree of significant hypotension.Findings consistent with small-bowel obstruction with evidence of pneumatosis and portal venous air. The exact transition zone is difficult to assess but likely lies in the proximal ileum. Hiatal hernia. Significant GI hemorrhage within the stomach and extending into the small bowel as described. Discussed with GI on-call who arrived bedside and discussed the case with surgery & IR on call. Due to the patient's co-morbidities including hiatal hernia & vessel disease he is not a candidate for emergent surgical or IR intervention. GI will attempt emergent bedside EGD.  - MTP with patient receiving a total of 3 units FFP, 1 unit plt, 1 unit cryo, 11 units RBC's - on maximum vasopressor dosage: Levophed, vasopressin, neo-synephrine, epinephrine, 3 L IVF, albumin - 4 amps sodium bicarbonate, 2 amps epinephrine, TXA  Hyperkalemia Severe Hypocalcemia - Insulin & D50 - 1 amp of Ca chloride, 2g ca gluconate  Additional CC time 30 minutes  Betsey Holiday, AGACNP-BC Acute Care Nurse Practitioner [facility]   [phone] / [phone] Please see Amion for pager details.

## 2022-09-13 NOTE — Assessment & Plan Note (Addendum)
Acute episode of large bloody emesis x 2  Noted history of chronic duodenal ulcer, duodenal perforation on PPI and carafate  Denies and NSAID, ETOH, antiplatelet use  Hgb 10.5-->8.1  S/p 2u pRBCs  Trend hgb  IV PPI Dr. Mia Bender w/ GI aware of case w/ likely plan for endoscopy

## 2022-09-13 NOTE — Care Plan (Signed)
Called back for unresponsiveness and refractory hypotension. CTA was obtained with diffuse hemorrhage. Discussed with IR and noted the difficult situation in terms of being unstable and severe findings of GI bleeding including likely bowel infarction. Touch based with surgery but patient is obviously too unstable for any surgical intervention especially with altered anatomy. Given this, have decided to try emergent EGD with potential hemospray. Patient only with daughter with mental disorder and ex-wife. Have attempted to call ex-wife x 2 but no answer.  Merlyn Lot MD, MPH [facility]

## 2022-09-13 NOTE — Progress Notes (Signed)
[facility], [place] 09/07/2022  Subjective:   LOS: 0 Patient known to our practice from previous admission back in 2022.  He normally dialyzes at [facility] TTS shift.  His last dialysis was on Saturday.  He has been on dialysis for 6 to 7 years.  He is followed by [facility] physicians. Today he presents for complaints of blood in vomitus.  He states that EMS told him they saw blood in his stool also. He is admitted for further evaluation. When seen, his blood pressure is in the upper 80s systolic.  He is not on any pressors at the moment. He is alert and able to provide information. He has 2+ lower extremity edema that extends up to mid shins.  He reports this is chronic. Denies any problems with breathing.    Objective:  Vital signs in last 24 hours:  Temp:  [96.7 F (35.9 C)-98.3 F (36.8 C)] 96.8 F (36 C) (04/30 1501) Pulse Rate:  [75-119] 87 (04/30 1531) Resp:  [11-21] 13 (04/30 1531) BP: (64-97)/(37-73) 85/53 (04/30 1531) SpO2:  [84 %-100 %] 100 % (04/30 1531) Weight:  [67.9 kg] 67.9 kg (04/30 1221)  Weight change:  Filed Weights   09/08/2022 1221  Weight: 67.9 kg    Intake/Output:    Intake/Output Summary (Last 24 hours) at 08/20/2022 1600 Last data filed at 09/04/2022 1500 Gross per 24 hour  Intake 200 ml  Output --  Net 200 ml     Physical Exam: General: No acute distress, laying in the bed  HEENT Anicteric, moist oral mucous membranes  Pulm/lungs Normal breathing effort  CVS/Heart No rub  Abdomen:  soft, nontender, nondistended  Extremities: 2+ pitting edema extending up to mid shins  Neurologic: Alert, oriented  Skin: Warm, dry  Access: Left forearm AV fistula       Basic Metabolic Panel:  Recent Labs  Lab 08/20/2022 0859  NA 138  K 4.1  CL 98  CO2 24  GLUCOSE 137*  BUN 48*  CREATININE 8.17*  CALCIUM 6.7*  MG 1.7  PHOS 6.2*     CBC: Recent Labs  Lab 08/21/2022 0859 08/16/2022 1316  WBC 21.0*  --   HGB  8.1* 9.3*  HCT 29.3* 30.4*  MCV 88.5  --   PLT 241  --       Lab Results  Component Value Date   HEPBSAG Negative 06/30/2016      Microbiology:  Recent Results (from the past 240 hour(s))  MRSA Next Gen by PCR, Nasal     Status: None   Collection Time: 08/31/2022 12:20 PM   Specimen: Nasal Mucosa; Nasal Swab  Result Value Ref Range Status   MRSA by PCR Next Gen NOT DETECTED NOT DETECTED Final    Comment: (NOTE) The GeneXpert MRSA Assay (FDA approved for NASAL specimens only), is one component of a comprehensive MRSA colonization surveillance program. It is not intended to diagnose MRSA infection nor to guide or monitor treatment for MRSA infections. Test performance is not FDA approved in patients less than 70 years old. Performed at [facility], [address] [place], [place] [address]     Coagulation Studies: Recent Labs    08/29/2022 0859  LABPROT 16.2*  INR 1.3*    Urinalysis: No results for input(s): "COLORURINE", "LABSPEC", "PHURINE", "GLUCOSEU", "HGBUR", "BILIRUBINUR", "KETONESUR", "PROTEINUR", "UROBILINOGEN", "NITRITE", "LEUKOCYTESUR" in the last 72 hours.  Invalid input(s): "APPERANCEUR"    Imaging: DG Chest Portable 1 View  Result Date: 09/12/2022  CLINICAL DATA:  Emesis EXAM: PORTABLE CHEST 1 VIEW COMPARISON:  CXR 08/12/20 FINDINGS: No pleural effusion. No pneumothorax. No focal airspace opacity. No radiographically apparent displaced rib fractures. Normal cardiac and mediastinal contours. Visualized upper abdomen is unremarkable. IMPRESSION: No focal airspace opacity. Electronically Signed   By: Lorenza Cambridge M.D.   On: 09/09/2022 09:52     Medications:    sodium chloride Stopped (08/25/2022 1500)   pantoprazole 8 mg/hr (08/21/2022 1259)    [MAR Hold] sodium chloride   Intravenous Once   [MAR Hold] sodium chloride   Intravenous Once   [MAR Hold] Chlorhexidine Gluconate Cloth  6 each Topical Daily   [MAR Hold] ondansetron **OR** [MAR Hold]  ondansetron (ZOFRAN) IV  Assessment/ Plan:  62 y.o. male with hyperlipidemia, restless leg syndrome, history of sleeve gastrectomy, osteoarthritis, sleep apnea/CPAP, end-stage renal disease, history of duodenal perforation, duodenal ulcer was admitted on 08/26/2022 for  Principal Problem:   UGIB (upper gastrointestinal bleed) Active Problems:   Anemia   ESRD on hemodialysis (HCC)   HTN (hypertension)   Cardiomyopathy (HCC)  Hemorrhagic shocks (HCC) [R57.8] Upper GI bleed [K92.2] UGIB (upper gastrointestinal bleed) [K92.2]  #. ESRD TTS/[facility] nephrology Today, patient's electrolytes and volume status are acceptable.  No acute indication for dialysis at present. We will evaluate in the a.m. for hemodialysis tomorrow.  #Lower extremity edema Patient reports his edema is chronic.   #. Anemia of CKD  Lab Results  Component Value Date   HGB 9.3 (L) 09/10/2022   Low dose EPO with HD  #. Secondary hyperparathyroidism of renal origin N 25.81   No results found for: "PTH" Lab Results  Component Value Date   PHOS 6.2 (H) 08/19/2022   Monitor calcium and phos level during this admission   #.  GI bleed EGD 08/25/2022 showed esophagitis, anastomotic ulcerations. GI following.   LOS: 0 Danniel Grenz Thedore Mins 4/30/20244:01 PM  [facility] [address] [place], [place] [phone]

## 2022-09-13 NOTE — H&P (Addendum)
History and Physical    Patient: Tristan Bender BJY:[id number] DOB: [birth year] DOA: 08/29/2022 DOS: the patient was seen and examined on 08/20/2022 PCP: Sherron Monday, MD  Patient coming from: Home  Chief Complaint:  Chief Complaint  Patient presents with   Hematemesis   Hypotension   HPI: Tristan Bender is a 62 y.o. male with medical history significant of end-stage renal disease on hemodialysis Tuesday Thursday Saturday, complicated GI history including history of adenomatous duodenal polyp status post removal, chronic duodenal ulcer with hemorrhage, history of duodenal perforation, sleep apnea presenting with upper GI bleeding.  Patient reports 2 episodes of very large bloody emesis.  Episode was sudden onset.  Denies any abdominal pain, nausea vomiting or malaise prior to symptoms.  Noted history of duodenal ulcer with hemorrhage as well as adenomatous polyp and duodenal perforation associate with endoscopy in the past.  Patient denies any NSAID or alcohol use.  Has been compliant with home Prilosec and Carafate.  No chest pain or shortness of breath.  Denies any recent strenuous activity.  No antiplatelet use. Presented to the ER afebrile, blood pressure initially in the 60s to 80s.  Received 2 units of emergency blood with blood pressures improving to the 80s to 90s with transfusion.  Satting well on room air.  Hemoglobin 8.1 with baseline hemoglobin around 10-11.  Platelets 241, creatinine 9.1, lipase 90.  Chest x-ray grossly stable.  Per ER physician Dr. Arnoldo Morale, case discussed with on-call gastroenterologist Dr. Mia Creek who will evaluate the patient for likely endoscopy. 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  -CCY 2022 and gastric sleeve history 2021    Review of Systems: As mentioned in the history of present illness. All other systems reviewed and are negative. Past Medical History:  Diagnosis Date   Adenomatous duodenal polyp    jan 2018- [facility]   Anemia    Bile leak 01/05/2021   Biliary colic 08/20/2020   Chronic duodenal ulcer with hemorrhage    Complication of anesthesia    had to abort previous colonoscopy due to vomiting   Duodenal perforation (HCC) 01/05/2021   GI bleed 06/30/2016   Hematemesis with nausea    Hemoptysis 09/06/2020   Hypertension    Polyp of intestine 09/09/2016   Formatting of this note might be different from the original. 4 cm sessile duodenal polyp   Renal disorder    on dialysis T, TH,SAT   RUQ abdominal pain 08/26/2020   Sleep apnea    uses CPAP (sometimes)   Past Surgical History:  Procedure Laterality Date   ABDOMINAL SURGERY     CHOLECYSTECTOMY     COLON SURGERY     COLONOSCOPY WITH PROPOFOL N/A 07/08/2021   Procedure: COLONOSCOPY WITH PROPOFOL;  Surgeon: Toney Reil, MD;  Location: [facility];  Service: Endoscopy;  Laterality: N/A;  sleep apnea needs potassium draw at arrival   DIALYSIS/PERMA CATHETER INSERTION N/A 06/30/2016   Procedure: Dialysis/Perma Catheter Insertion;  Surgeon: Annice Needy, MD;  Location: [facility];  Service: Cardiovascular;  Laterality: N/A;   ESOPHAGOGASTRODUODENOSCOPY (EGD) WITH PROPOFOL N/A 07/01/2016   Procedure: ESOPHAGOGASTRODUODENOSCOPY (EGD) WITH PROPOFOL;  Surgeon: Midge Minium, MD;  Location: [facility];  Service: Endoscopy;  Laterality: N/A;   ESOPHAGOGASTRODUODENOSCOPY (EGD) WITH PROPOFOL N/A 09/07/2020   Procedure: ESOPHAGOGASTRODUODENOSCOPY (EGD)  WITH PROPOFOL;  Surgeon: Toney Reil, MD;  Location: [facility];  Service: Gastroenterology;  Laterality: N/A;   ESOPHAGOGASTRODUODENOSCOPY (EGD) WITH PROPOFOL N/A 09/09/2021   Procedure: ESOPHAGOGASTRODUODENOSCOPY (EGD) WITH PROPOFOL;  Surgeon: Toney Reil, MD;  Location: [facility];  Service: Endoscopy;  Laterality: N/A;   LAPAROSCOPIC GASTRIC SLEEVE RESECTION     LEFT HEART CATH AND CORONARY ANGIOGRAPHY N/A 04/15/2022   Procedure: LEFT HEART CATH AND CORONARY ANGIOGRAPHY;  Surgeon: Laurier Nancy, MD;  Location: [facility];  Service: Cardiovascular;  Laterality: N/A;   Social History:  reports that he has never smoked. He has never used smokeless tobacco. He reports that he does not drink alcohol and does not use drugs.  Allergies  Allergen Reactions   Midazolam Other (See Comments)    Pt reports dropped his blood pressure very low Low BP     Family History  Problem Relation Age of Onset   Renal Disease Maternal Uncle     Prior to Admission medications   Medication Sig Start Date End Date Taking? Authorizing Provider  B Complex-C-Folic Acid (RENA-VITE RX) 1 MG TABS Take 1 tablet by mouth daily. Patient not taking: Reported on 04/15/2022    [provider]  Cholecalciferol (VITAMIN D PO) Take 1.25 mcg by mouth daily. Takes before dialysis T-Th-S Patient not taking: Reported on 04/15/2022    [provider]  famotidine (PEPCID) 20 MG tablet Take 1 tablet (20 mg total) by mouth 2 (two) times daily for 15 days. 11/18/21 12/03/21  Sharman Cheek, MD  folic acid (FOLVITE) 1 MG tablet TAKE 1 TABLET(1 MG) BY MOUTH DAILY Patient not taking: Reported on 04/15/2022 07/21/21   Toney Reil, MD  lidocaine (XYLOCAINE) 2 % solution Use as directed 15 mLs in the mouth or throat every 6 (six) hours as needed for mouth pain. Patient not taking: Reported on 04/15/2022 10/18/21   Toney Reil, MD  metoCLOPramide (REGLAN) 10 MG tablet Take  1 tablet (10 mg total) by mouth every 6 (six) hours as needed. Patient not taking: Reported on 04/15/2022 11/18/21   Sharman Cheek, MD  omeprazole (PRILOSEC) 40 MG capsule Take 1 capsule (40 mg total) by mouth 2 (two) times daily before a meal. 09/09/21 01/07/22  Vanga, Loel Dubonnet, MD  ondansetron (ZOFRAN-ODT) 4 MG disintegrating tablet Take by mouth daily. 08/22/21   [provider]  Oxycodone HCl 10 MG TABS Take 10 mg by mouth daily. Patient not taking: Reported on 04/15/2022 10/04/21   [provider]  pregabalin (LYRICA) 75 MG capsule Take 75 mg by mouth 2 (two) times daily. 10/14/21   [provider]  sucralfate (CARAFATE) 1 g tablet Take 1 tablet (1 g total) by mouth 4 (four) times daily. 11/18/21   Sharman Cheek, MD  sucroferric oxyhydroxide (VELPHORO) 500 MG chewable tablet CRUSH OR CHEW AND SWALLOW 1 TABLET 3 TIMES A DAY WITH MEALS Patient not taking: Reported on 09/02/2021 07/23/20   [provider]    Physical Exam: Vitals:   09/09/2022 1115 09/06/2022 1130 08/22/2022 1145 09/06/2022 1221  BP: (!) 87/57 (!) 85/60 (!) 82/62 93/63  Pulse: 99 80 81 75  Resp: 14 13 16 20   Temp:  97.6 F (36.4 C)  TempSrc:    Oral  SpO2: 96% 100% 100% 100%  Weight:    67.9 kg  Height:    5\' 7"  (1.702 m)   Physical Exam Constitutional:      Appearance: He is normal weight.  HENT:     Head: Normocephalic.     Comments: Visible dry blood around oropharynx      Nose: Nose normal.  Eyes:     Pupils: Pupils are equal, round, and reactive to light.  Cardiovascular:     Rate and Rhythm: Normal rate and regular rhythm.  Pulmonary:     Effort: Pulmonary effort is normal.  Abdominal:     General: Bowel sounds are normal.  Musculoskeletal:        General: Normal range of motion.     Cervical back: Normal range of motion.  Skin:    General: Skin is warm.  Neurological:     General: No focal deficit present.  Psychiatric:        Mood and Affect: Mood normal.      Data Reviewed:  There are no new results to review at this time. DG Chest Portable 1 View CLINICAL DATA:  Emesis  EXAM: PORTABLE CHEST 1 VIEW  COMPARISON:  CXR 08/12/20  FINDINGS: No pleural effusion. No pneumothorax. No focal airspace opacity. No radiographically apparent displaced rib fractures. Normal cardiac and mediastinal contours. Visualized upper abdomen is unremarkable.  IMPRESSION: No focal airspace opacity.  Electronically Signed   By: Lorenza Cambridge M.D.   On: 08/22/2022 09:52  Lab Results  Component Value Date   WBC 21.0 (H) 09/08/2022   HGB 8.1 (L) 09/06/2022   HCT 29.3 (L) 08/28/2022   MCV 88.5 08/29/2022   PLT 241 08/26/2022   Last metabolic panel Lab Results  Component Value Date   GLUCOSE 137 (H) 08/31/2022   NA 138 09/05/2022   K 4.1 08/23/2022   CL 98 08/24/2022   CO2 24 08/30/2022   BUN 48 (H) 08/25/2022   CREATININE 8.17 (H) 08/27/2022   GFRNONAA 7 (L) 09/01/2022   CALCIUM 6.7 (L) 08/17/2022   PHOS 6.2 (H) 08/23/2022   PROT 6.5 08/30/2022   ALBUMIN 2.5 (L) 09/11/2022   BILITOT 0.9 09/06/2022   ALKPHOS 68 08/27/2022   AST 24 08/24/2022   ALT 10 08/19/2022   ANIONGAP 16 (H) 08/20/2022    Assessment and Plan: * UGIB (upper gastrointestinal bleed) Acute episode of large bloody emesis x 2  Noted history of chronic duodenal ulcer, duodenal perforation on PPI and carafate  Denies and NSAID, ETOH, antiplatelet use  Hgb 10.5-->8.1  S/p 2u pRBCs  Trend hgb  IV PPI Dr. Mia Creek w/ GI aware of case w/ likely plan for endoscopy   Anemia Hgb 10.5-->8.1 in setting of acute blood loss anemia w/ UGIB S/p 2 u pRBC transfusion in ER  Trend hgb  Pending endoscopy  Transfuse for hgb <7   HTN (hypertension) BPs 80s-90s in setting of uGIB w/ pRBC transfusion ongoing  Slowly uptrending  Noted baseline ESRD Consider midodrine  Hold home BP regimen    Cardiomyopathy (HCC) 2D ECHO 04/2022 w/ EF 50-55%  Monitor volume status w/ pRBC  transfusion and pending HD Follow   ESRD on hemodialysis (HCC) HD TTS  Due for HD  Will reach to nephrology to coordinate        Advance Care Planning:   Code Status: Full Code   Consults: GI w/ Dr. Mia Creek   Family Communication:  Family at the bedside   Severity of Illness: The appropriate patient status for this patient is INPATIENT. Inpatient status is judged to be reasonable and necessary in order to provide the required intensity of service to ensure the patient's safety. The patient's presenting symptoms, physical exam findings, and initial radiographic and laboratory data in the context of their chronic comorbidities is felt to place them at high risk for further clinical deterioration. Furthermore, it is not anticipated that the patient will be medically stable for discharge from the hospital within 2 midnights of admission.   * I certify that at the point of admission it is my clinical judgment that the patient will require inpatient hospital care spanning beyond 2 midnights from the point of admission due to high intensity of service, high risk for further deterioration and high frequency of surveillance required.*  Author: Floydene Flock, MD 09/12/2022 12:34 PM  For on call review [email or web address].

## 2022-09-13 NOTE — Consult Note (Signed)
Consultation  Referring Provider:     Dr Arnoldo Morale Admit date 08/16/2022 Consult date        09/12/2022 Reason for Consultation:     Hematemesis         HPI:   Tristan Bender is a 62 y.o. male with a past medical history significant for ESRD on HD TThSa (L AVF), history of GERD/PUD, chronic abdominal pain, history of gastric sleeve in 2021, who presents to the emergency department with hematemesis and hematochezia.  He has been transfused 1u prbc for hgb 8.1 with hypotension. Pantoprazole gtt is to be started States he was at home last night feeling well in his usual state of health when he had a sudden sensation of nausea immediately followed by onset of several episodes of vomiting coffee ground emesis also with some red material. Does note some intermittent ruq pain since his exlap 7/22 but has worsened some lately. Says he has been told it is nerve pain from his past surgeries. He denies any nsaids. States he is not taking his famotidine, omeprazole, metoclopramide  or  carafate regularly. States his appetite has been good. Last po intake last night prior to symptom onset. Denies any further GI concerns. States he is feeling better this morning. No known problems with sedated procedures. Denies any family history of GI problems. Last dialysis Saturday. Labs as below.  PREVIOUS ENDOSCOPIES:            -EGD 4/23- Dr Princess Bruins adenoma folow up- patent Bilroth II gastrojejunostomy with inflammation but intact staple line, superficial esophageal 15mm ulcer without bleeding -Colonoscopy 2/23- Dr Allegra Lai- normal, 10y repeat -EGD 7/22  [facility] for duodenal adenoma with focus of high grade dysplasia  and without carcinoma(Dr Josetta Huddle) removal complicated by perforation, ex lap 7/22, jejunostomy with pyloric exclusion and placement of GJ tube with repair to duodenal perforation and omental patch, GJ tube removed and 2022.  -CCY 2022 and gastric sleeve history 2021  Past Medical History:  Diagnosis Date   Adenomatous  duodenal polyp    jan 2018- [facility]   Anemia    Bile leak 01/05/2021   Biliary colic 08/20/2020   Chronic duodenal ulcer with hemorrhage    Complication of anesthesia    had to abort previous colonoscopy due to vomiting   Duodenal perforation (HCC) 01/05/2021   GI bleed 06/30/2016   Hematemesis with nausea    Hemoptysis 09/06/2020   Hypertension    Polyp of intestine 09/09/2016   Formatting of this note might be different from the original. 4 cm sessile duodenal polyp   Renal disorder    on dialysis T, TH,SAT   RUQ abdominal pain 08/26/2020   Sleep apnea    uses CPAP (sometimes)    Past Surgical History:  Procedure Laterality Date   ABDOMINAL SURGERY     CHOLECYSTECTOMY     COLON SURGERY     COLONOSCOPY WITH PROPOFOL N/A 07/08/2021   Procedure: COLONOSCOPY WITH PROPOFOL;  Surgeon: Toney Reil, MD;  Location: [facility];  Service: Endoscopy;  Laterality: N/A;  sleep apnea needs potassium draw at arrival   DIALYSIS/PERMA CATHETER INSERTION N/A 06/30/2016   Procedure: Dialysis/Perma Catheter Insertion;  Surgeon: Annice Needy, MD;  Location: [facility];  Service: Cardiovascular;  Laterality: N/A;   ESOPHAGOGASTRODUODENOSCOPY (EGD) WITH PROPOFOL N/A 07/01/2016   Procedure: ESOPHAGOGASTRODUODENOSCOPY (EGD) WITH PROPOFOL;  Surgeon: Midge Minium, MD;  Location: [facility];  Service: Endoscopy;  Laterality: N/A;   ESOPHAGOGASTRODUODENOSCOPY (EGD) WITH PROPOFOL N/A 09/07/2020  Procedure: ESOPHAGOGASTRODUODENOSCOPY (EGD) WITH PROPOFOL;  Surgeon: Toney Reil, MD;  Location: [facility]COPY;  Service: Gastroenterology;  Laterality: N/A;   ESOPHAGOGASTRODUODENOSCOPY (EGD) WITH PROPOFOL N/A 09/09/2021   Procedure: ESOPHAGOGASTRODUODENOSCOPY (EGD) WITH PROPOFOL;  Surgeon: Toney Reil, MD;  Location: [facility];  Service: Endoscopy;  Laterality: N/A;   LAPAROSCOPIC GASTRIC SLEEVE RESECTION     LEFT HEART CATH AND CORONARY ANGIOGRAPHY N/A 04/15/2022    Procedure: LEFT HEART CATH AND CORONARY ANGIOGRAPHY;  Surgeon: Laurier Nancy, MD;  Location: [facility];  Service: Cardiovascular;  Laterality: N/A;    Family History  Problem Relation Age of Onset   Renal Disease Maternal Uncle      Social History   Tobacco Use   Smoking status: Never   Smokeless tobacco: Never  Vaping Use   Vaping Use: Never used  Substance Use Topics   Alcohol use: No   Drug use: No    Prior to Admission medications   Medication Sig Start Date End Date Taking? Authorizing Provider  B Complex-C-Folic Acid (RENA-VITE RX) 1 MG TABS Take 1 tablet by mouth daily. Patient not taking: Reported on 04/15/2022    [provider]  Cholecalciferol (VITAMIN D PO) Take 1.25 mcg by mouth daily. Takes before dialysis T-Th-S Patient not taking: Reported on 04/15/2022    [provider]  famotidine (PEPCID) 20 MG tablet Take 1 tablet (20 mg total) by mouth 2 (two) times daily for 15 days. 11/18/21 12/03/21  Sharman Cheek, MD  folic acid (FOLVITE) 1 MG tablet TAKE 1 TABLET(1 MG) BY MOUTH DAILY Patient not taking: Reported on 04/15/2022 07/21/21   Toney Reil, MD  lidocaine (XYLOCAINE) 2 % solution Use as directed 15 mLs in the mouth or throat every 6 (six) hours as needed for mouth pain. Patient not taking: Reported on 04/15/2022 10/18/21   Toney Reil, MD  metoCLOPramide (REGLAN) 10 MG tablet Take 1 tablet (10 mg total) by mouth every 6 (six) hours as needed. Patient not taking: Reported on 04/15/2022 11/18/21   Sharman Cheek, MD  omeprazole (PRILOSEC) 40 MG capsule Take 1 capsule (40 mg total) by mouth 2 (two) times daily before a meal. 09/09/21 01/07/22  Vanga, Loel Dubonnet, MD  ondansetron (ZOFRAN-ODT) 4 MG disintegrating tablet Take by mouth daily. 08/22/21   [provider]  Oxycodone HCl 10 MG TABS Take 10 mg by mouth daily. Patient not taking: Reported on 04/15/2022 10/04/21   [provider]  pregabalin (LYRICA) 75 MG  capsule Take 75 mg by mouth 2 (two) times daily. 10/14/21   [provider]  sucralfate (CARAFATE) 1 g tablet Take 1 tablet (1 g total) by mouth 4 (four) times daily. 11/18/21   Sharman Cheek, MD  sucroferric oxyhydroxide (VELPHORO) 500 MG chewable tablet CRUSH OR CHEW AND SWALLOW 1 TABLET 3 TIMES A DAY WITH MEALS Patient not taking: Reported on 09/02/2021 07/23/20   [provider]    Current Facility-Administered Medications  Medication Dose Route Frequency Provider Last Rate Last Admin   0.9 %  sodium chloride infusion (Manually program via Guardrails IV Fluids)   Intravenous Once Corena Herter, MD   Held at 08/19/2022 0958   0.9 %  sodium chloride infusion (Manually program via Guardrails IV Fluids)   Intravenous Once Corena Herter, MD   Held at 09/11/2022 0959   pantoprozole (PROTONIX) 80 mg /NS 100 mL infusion  8 mg/hr Intravenous Continuous Corena Herter, MD       Current Outpatient  Medications  Medication Sig Dispense Refill   B Complex-C-Folic Acid (RENA-VITE RX) 1 MG TABS Take 1 tablet by mouth daily. (Patient not taking: Reported on 04/15/2022)     Cholecalciferol (VITAMIN D PO) Take 1.25 mcg by mouth daily. Takes before dialysis T-Th-S (Patient not taking: Reported on 04/15/2022)     famotidine (PEPCID) 20 MG tablet Take 1 tablet (20 mg total) by mouth 2 (two) times daily for 15 days. 30 tablet 0   folic acid (FOLVITE) 1 MG tablet TAKE 1 TABLET(1 MG) BY MOUTH DAILY (Patient not taking: Reported on 04/15/2022) 30 tablet 0   lidocaine (XYLOCAINE) 2 % solution Use as directed 15 mLs in the mouth or throat every 6 (six) hours as needed for mouth pain. (Patient not taking: Reported on 04/15/2022) 100 mL 1   metoCLOPramide (REGLAN) 10 MG tablet Take 1 tablet (10 mg total) by mouth every 6 (six) hours as needed. (Patient not taking: Reported on 04/15/2022) 30 tablet 0   omeprazole (PRILOSEC) 40 MG capsule Take 1 capsule (40 mg total) by mouth 2 (two) times daily before a meal. 60  capsule 3   ondansetron (ZOFRAN-ODT) 4 MG disintegrating tablet Take by mouth daily.     Oxycodone HCl 10 MG TABS Take 10 mg by mouth daily. (Patient not taking: Reported on 04/15/2022)     pregabalin (LYRICA) 75 MG capsule Take 75 mg by mouth 2 (two) times daily.     sucralfate (CARAFATE) 1 g tablet Take 1 tablet (1 g total) by mouth 4 (four) times daily. 120 tablet 1   sucroferric oxyhydroxide (VELPHORO) 500 MG chewable tablet CRUSH OR CHEW AND SWALLOW 1 TABLET 3 TIMES A DAY WITH MEALS (Patient not taking: Reported on 09/02/2021)     Facility-Administered Medications Ordered in Other Encounters  Medication Dose Route Frequency Provider Last Rate Last Admin   sodium chloride flush (NS) 0.9 % injection 3 mL  3 mL Intravenous Q12H Scoggins, Amber, NP        Allergies as of 08/23/2022 - Review Complete 08/21/2022  Allergen Reaction Noted   Midazolam Other (See Comments) 12/17/2020     Review of Systems:    All systems reviewed and negative except where noted in HPI.      Physical Exam:  Vital signs in last 24 hours: Temp:  [98.2 F (36.8 C)-98.3 F (36.8 C)] 98.2 F (36.8 C) (04/30 1100) Pulse Rate:  [81-119] 81 (04/30 1100) Resp:  [11-21] 13 (04/30 1100) BP: (64-97)/(37-73) 95/63 (04/30 1100) SpO2:  [84 %-100 %] 100 % (04/30 1100)   General:   Pleasant man in NAD Head:  Normocephalic and atraumatic. Eyes:   No icterus.   Conjunctiva pink. Ears:  Normal auditory acuity. Mouth: Mucosa pink moist, no lesions. Neck:  Supple; no masses felt Lungs:  Respirations even and unlabored. Lungs clear to auscultation bilaterally.   No wheezes, crackles, or rhonchi.  Heart:  S1S2, RRR, no MRG. No edema. Abdomen:   Flat, soft, nondistended, nontender. Normal bowel sounds. No appreciable masses or hepatomegaly. No rebound signs or other peritoneal signs. Rectal:  Not performed.  Msk:  MAEW x4, No clubbing or cyanosis. Strength 5/5. Symmetrical without gross deformities. Neurologic:  Alert  and  oriented x4;  Cranial nerves II-XII intact.  Skin:  scarring to arms and abdomen Warm, dry, pale pink without significant lesions or rashes. Psych:  Alert and cooperative. Normal affect.  LAB RESULTS: Recent Labs    08/29/2022 0859  WBC 21.0*  HGB 8.1*  HCT 29.3*  PLT 241   BMET Recent Labs    08/17/2022 0859  NA 138  K 4.1  CL 98  CO2 24  GLUCOSE 137*  BUN 48*  CREATININE 8.17*  CALCIUM 6.7*   LFT Recent Labs    09/12/2022 0859  PROT 6.5  ALBUMIN 2.5*  AST 24  ALT 10  ALKPHOS 68  BILITOT 0.9   PT/INR Recent Labs    09/10/2022 0859  LABPROT 16.2*  INR 1.3*    STUDIES: DG Chest Portable 1 View  Result Date: 08/18/2022 CLINICAL DATA:  Emesis EXAM: PORTABLE CHEST 1 VIEW COMPARISON:  CXR 08/12/20 FINDINGS: No pleural effusion. No pneumothorax. No focal airspace opacity. No radiographically apparent displaced rib fractures. Normal cardiac and mediastinal contours. Visualized upper abdomen is unremarkable. IMPRESSION: No focal airspace opacity. Electronically Signed   By: Lorenza Cambridge M.D.   On: 08/28/2022 09:52       Impression / Plan:   Hematemesis/hematochezia- rapid upper GIB can sometimes cause hematochezia. Agree with prbc's and pantoprazole gtt/following hgb and transfusing to keep hgb >7. Arranging EGD tentatively for today pending clinical stability. Indication/procedure/beneifit/risks discussed with him and he is agreeable. Recommend indefinite PPI therapy as o/p based on his PUD history.  Thank you very much for this consult. These services were provided by Vevelyn Pat, NP-C, in collaboration with Regis Bill, MD, with whom I have discussed this patient in full.   Vevelyn Pat, NP-C

## 2022-09-14 ENCOUNTER — Encounter: Payer: Self-pay | Admitting: Gastroenterology

## 2022-09-14 ENCOUNTER — Encounter: Payer: Self-pay | Admitting: Registered Nurse

## 2022-09-14 LAB — BPAM RBC
Blood Product Expiration Date: 202405242359
Blood Product Expiration Date: 202405252359
Blood Product Expiration Date: 202405252359
Blood Product Expiration Date: 202405252359
Blood Product Expiration Date: 202405252359
Blood Product Expiration Date: 202405262359
Blood Product Expiration Date: 202406032359
Blood Product Expiration Date: 202406052359
Blood Product Expiration Date: 202406052359
ISSUE DATE / TIME: 202404300908
ISSUE DATE / TIME: 202404300908
ISSUE DATE / TIME: 202404301900
ISSUE DATE / TIME: 202404301930
ISSUE DATE / TIME: 202404301956
ISSUE DATE / TIME: 202404302037
ISSUE DATE / TIME: 202404302205
ISSUE DATE / TIME: 202404302236
ISSUE DATE / TIME: 202404302236
Unit Type and Rh: 5100
Unit Type and Rh: 6200
Unit Type and Rh: 6200
Unit Type and Rh: 6200
Unit Type and Rh: 6200
Unit Type and Rh: 6200
Unit Type and Rh: 6200
Unit Type and Rh: 6200
Unit Type and Rh: 6200
Unit Type and Rh: 6200
Unit Type and Rh: 6200

## 2022-09-14 LAB — PREPARE FRESH FROZEN PLASMA

## 2022-09-14 LAB — TYPE AND SCREEN
ABO/RH(D): A POS
Unit division: 0
Unit division: 0
Unit division: 0
Unit division: 0
Unit division: 0
Unit division: 0
Unit division: 0
Unit division: 0
Unit division: 0
Unit division: 0
Unit division: 0
Unit division: 0
Unit division: 0

## 2022-09-14 LAB — BPAM FFP
Blood Product Expiration Date: 202405052359
ISSUE DATE / TIME: 202404301930
ISSUE DATE / TIME: 202404301956

## 2022-09-14 LAB — GLUCOSE, CAPILLARY
Glucose-Capillary: 49 mg/dL — ABNORMAL LOW (ref 70–99)
Glucose-Capillary: 59 mg/dL — ABNORMAL LOW (ref 70–99)

## 2022-09-14 LAB — PREPARE CRYOPRECIPITATE: Unit division: 0

## 2022-09-14 LAB — BPAM CRYOPRECIPITATE: Blood Product Expiration Date: 202405010249

## 2022-09-14 LAB — PREPARE PLATELET PHERESIS

## 2022-09-14 LAB — BPAM PLATELET PHERESIS: Blood Product Expiration Date: 202405022359

## 2022-09-14 LAB — HEPATITIS B SURFACE ANTIGEN: Hepatitis B Surface Ag: NONREACTIVE

## 2022-09-14 LAB — HIV ANTIBODY (ROUTINE TESTING W REFLEX): HIV Screen 4th Generation wRfx: NONREACTIVE

## 2022-09-14 NOTE — Death Summary Note (Addendum)
DEATH SUMMARY   Patient Details  Name: Tristan Bender MRN: [id number] DOB: August 18, 1960  Admission/Discharge Information   Admit Date:  10-02-22  Date of Death:  2022/10/02  Time of Death:  23:43  Length of Stay: 0  Referring Physician: Sherron Monday, MD   Reason(s) for Hospitalization  Acute blood loss anemia s/t Acute upper GIB  Diagnoses  Preliminary cause of death: Upper Gastrointestinal bleeding Secondary Diagnoses (including complications and co-morbidities):  Principal Problem:   UGIB (upper gastrointestinal bleed) Active Problems:   Anemia   ESRD on hemodialysis (HCC)   HTN (hypertension)   Cardiomyopathy (HCC)   Hemorrhagic shock (HCC)   Cardiac arrest, cause unspecified (HCC)   Acute respiratory failure with hypoxia (HCC)   Aspiration into airway   Brief Hospital Course (including significant findings, care, treatment, and services provided and events leading to death)  Tristan Bender is a 62 y.o. year old male who was admitted with acute blood loss anemia in the setting of acute upper GI bleed.  ED Course: Initial Vital Signs: afebrile, blood pressure initially in the 60s to 80s. Received 2 units of emergency blood with blood pressures improving to the 80s to 90s with transfusion. Satting well on room air.  Significant Labs: Hemoglobin 8.1 with baseline hemoglobin around 10-11. Platelets 241, creatinine 9.1, lipase 90.  2022-10-02: Admitted by hospitalist for acute GI bleed, GI consulted.  EGD performed which showed: LA Grade D (one or more mucosal breaks involving at least 75% of  esophageal circumference) esophagitis with no bleeding was found.  Evidence of a patent Billroth II gastrojejunostomy was found. The gastrojejunal anastomosis was characterized by ulceration and visible sutures. This was traversed. The efferent limb was examined. The afferent limb was examined. Localized granular mucosa was found at the anastomosis. Biopsies were taken with a cold forceps for  histology. Estimated blood loss was minimal. The examined jejunum was normal."  He returned to ICU post procedure 10-02-22:  Became unresponsive with agonal respirations, aspirating large volume of hematemesis with large clots, along with hematochezia.  Emergently intubated, with Hemorrhagic shock, pt. Received 11 units of pRBC's, 3 FF, 1 cryo, 1 platelets.  Discussed with GI, STAT CT angiogram Abdomen obtained showing massive GI hemorrhage, SBO and pneumatosis. Urgently consulted GI, IR & general surgery, however the patient is not a surgical candidate. Therefore GI arrived bedside to perform an emergent EGD, but was unable to visualize any area in order to attempt homeostasis. Patient lost pulses briefly requiring 3 minutes of CPR/ACLS with ROSC. Family consulted by GI and PCCM with the ultimate decision to transition to comfort measures only.    Pertinent Labs and Studies  Significant Diagnostic Studies CT Angio Abd/Pel w/ and/or w/o  Result Date: 2022-10-02 CLINICAL DATA:  Lower GI hemorrhage EXAM: CTA ABDOMEN AND PELVIS WITHOUT AND WITH CONTRAST TECHNIQUE: Multidetector CT imaging of the abdomen and pelvis was performed using the standard protocol during bolus administration of intravenous contrast. Multiplanar reconstructed images and MIPs were obtained and reviewed to evaluate the vascular anatomy. RADIATION DOSE REDUCTION: This exam was performed according to the departmental dose-optimization program which includes automated exposure control, adjustment of the mA and/or kV according to patient size and/or use of iterative reconstruction technique. CONTRAST:  OMNIPAQUE IOHEXOL 350 MG/ML SOLN COMPARISON:  None Available. FINDINGS: VASCULAR Aorta: Abdominal aorta demonstrates atherosclerotic calcifications without aneurysmal dilatation or dissection. Celiac: Patent without evidence of aneurysm, dissection, vasculitis or significant stenosis. The initial arterial set was obtained very early in the  arterial phase and distal branches of the celiac axis are not opacified. They show improved opacification on the more delayed arterial phase images. Heavy calcifications are noted. SMA: Calcifications are identified. The branches of the superior mesenteric artery are attenuated distally which may be related to underlying hypotension as well as inflammatory changes within the bowel. Renals: Renal arteries are patent proximally although distally somewhat diminutive in size also likely related to hypotension. IMA: Patent but diminutive in nature for similar reasons. Inflow: Iliacs demonstrate atherosclerotic calcification. No aneurysmal dilatation is noted. Veins: The timing of the imaging in the venous phase is somewhat limited. There are foci of air within the main portal vein with poor opacification of the portal venous branches. There are also attenuated likely again related to hypotension. Considerable portal venous air is noted within the liver. Review of the MIP images confirms the above findings. NON-VASCULAR Lower chest: Atelectatic changes are noted in the bases bilaterally. Hepatobiliary: The liver demonstrates significant geographic decreased attenuation likely related to the poor opacification and flow within the portal vein as well as the significant amount of portal venous gas. Pancreas: Pancreas shows a normal enhancement pattern. Spleen: Normal in size without focal abnormality. Adrenals/Urinary Tract: Adrenal glands demonstrate hyperdensity consistent with slow flow. The kidneys demonstrate multiple cystic lesions as well as decreased enhancement. The decreased enhancement is also likely related to poor perfusion related to the hypotension. The bladder is decompressed. Stomach/Bowel: The stomach demonstrates a large hiatal hernia. There is significant hyperdense material noted within the stomach and hiatal hernia with significant contrast extravasation into the lumen on delayed images consistent with  acute GI hemorrhage. Additionally filling defects are noted which are surrounded by the active hemorrhage likely related to thrombus. This corresponds with the patient's given clinical history. The focus of GI hemorrhage is somewhat difficult to assess although likely lies within the residual stomach just below the hiatal hernia. Extension of contrast into the proximal small bowel related to the gastric bypass is noted as well. Multiple dilated loops of small bowel are identified with evidence of pneumatosis. These findings correspond with significant amount of portal venous air. Colon appears within normal limits. Transition zone of the small bowel dilatation is not well appreciated due to the poor enhancement from hypotension but likely lies in the proximal jejunum likely related to Lymphatic: No significant lymphadenopathy is noted. Reproductive: Prostate is unremarkable. Other: No abdominal wall hernia or abnormality. No abdominopelvic ascites. Mild changes of anasarca are noted Musculoskeletal: No acute or significant osseous findings. IMPRESSION: VASCULAR Significant attenuation of arterial and venous branches related to severe hypotension. Findings consistent with acute GI hemorrhage within the stomach although the exact site of acute hemorrhage is difficult to assess due to the widespread contrast extravasation. Considerable filling defect is noted within the stomach and proximal small bowel likely related to thrombus. Difficult portal venous air with geographic enhancement normality is in the liver likely related to poor flow in the degree of significant hypotension. NON-VASCULAR Findings consistent with small-bowel obstruction with evidence of pneumatosis and portal venous air. The exact transition zone is difficult to assess but likely lies in the proximal ileum. Hiatal hernia. Significant GI hemorrhage within the stomach and extending into the small bowel as described. Critical Value/emergent results  were called by telephone at the time of interpretation on 08/26/2022 at 9:07 pm to Cheryll Cockayne NP, who verbally acknowledged these results. Electronically Signed   By: Alcide Clever M.D.   On: 09/01/2022 21:13   CT HEAD WO  CONTRAST ( )  Result Date: 09/10/2022 CLINICAL DATA:  Altered mental status EXAM: CT HEAD WITHOUT CONTRAST TECHNIQUE: Contiguous axial images were obtained from the base of the skull through the vertex without intravenous contrast. RADIATION DOSE REDUCTION: This exam was performed according to the departmental dose-optimization program which includes automated exposure control, adjustment of the mA and/or kV according to patient size and/or use of iterative reconstruction technique. COMPARISON:  None Available. FINDINGS: Brain: There is no mass, hemorrhage or extra-axial collection. The size and configuration of the ventricles and extra-axial CSF spaces are normal. The brain parenchyma is normal, without acute or chronic infarction. Vascular: No abnormal hyperdensity of the major intracranial arteries or dural venous sinuses. No intracranial atherosclerosis. Skull: The visualized skull base, calvarium and extracranial soft tissues are normal. Sinuses/Orbits: No fluid levels or advanced mucosal thickening of the visualized paranasal sinuses. No mastoid or middle ear effusion. The orbits are normal. IMPRESSION: Normal head CT. Electronically Signed   By: Deatra Robinson M.D.   On: 09/10/2022 20:49   DG Chest 1 View  Result Date: 08/16/2022 CLINICAL DATA:  Intubation and central line placement EXAM: CHEST  1 VIEW COMPARISON:  Radiograph 09/03/2022 at 6:53 p.m. FINDINGS: Enteric tube tip in the intrathoracic trachea 4.4 cm from the carina. New left IJ CVC tip in the mid SVC. Stable cardiomediastinal silhouette. Aortic atherosclerotic calcification. Left basilar and upper lung scarring. No focal consolidation, pleural effusion, or pneumothorax. IMPRESSION: 1. Enteric tube tip in the intrathoracic  trachea 4.4 cm from the carina. 2. New left IJ CVC tip in the mid SVC. Electronically Signed   By: Minerva Fester M.D.   On: 09/10/2022 19:47   DG Chest Port 1 View  Result Date: 08/29/2022 CLINICAL DATA:  Check endotracheal tube placement EXAM: PORTABLE CHEST 1 VIEW COMPARISON:  Film from earlier in the same day. FINDINGS: Cardiac shadows within normal limits. Aortic calcifications are noted. Endotracheal tube is noted in satisfactory position. Mild scarring is noted in the left base. No bony abnormality is noted. IMPRESSION: Endotracheal tube in satisfactory position. Remainder of the exam is unchanged. Electronically Signed   By: Alcide Clever M.D.   On: 08/23/2022 19:16   DG Chest Portable 1 View  Result Date: 09/12/2022 CLINICAL DATA:  Emesis EXAM: PORTABLE CHEST 1 VIEW COMPARISON:  CXR 08/12/20 FINDINGS: No pleural effusion. No pneumothorax. No focal airspace opacity. No radiographically apparent displaced rib fractures. Normal cardiac and mediastinal contours. Visualized upper abdomen is unremarkable. IMPRESSION: No focal airspace opacity. Electronically Signed   By: Lorenza Cambridge M.D.   On: 09/05/2022 09:52    Microbiology Recent Results (from the past 240 hour(s))  MRSA Next Gen by PCR, Nasal     Status: None   Collection Time: 09/07/2022 12:20 PM   Specimen: Nasal Mucosa; Nasal Swab  Result Value Ref Range Status   MRSA by PCR Next Gen NOT DETECTED NOT DETECTED Final    Comment: (NOTE) The GeneXpert MRSA Assay (FDA approved for NASAL specimens only), is one component of a comprehensive MRSA colonization surveillance program. It is not intended to diagnose MRSA infection nor to guide or monitor treatment for MRSA infections. Test performance is not FDA approved in patients less than 44 years old. Performed at [facility], [address], [place], [place] [address]     Lab Basic Metabolic Panel: Recent Labs  Lab 08/16/2022 0859 09/05/2022 1944  NA 138 135  135  K 4.1  6.5*  6.5*  CL 98 104  104  CO2 24 14*  17*  GLUCOSE 137* 312*  316*  BUN 48* 48*  50*  CREATININE 8.17* 6.97*  6.80*  CALCIUM 6.7* 5.1*  5.1*  MG 1.7  --   PHOS 6.2* 7.6*   Liver Function Tests: Recent Labs  Lab 09/07/2022 0859 09/02/2022 1944  AST 24  --   ALT 10  --   ALKPHOS 68  --   BILITOT 0.9  --   PROT 6.5  --   ALBUMIN 2.5* <1.5*   Recent Labs  Lab 08/30/2022 0859  LIPASE 90*   No results for input(s): "AMMONIA" in the last 168 hours. CBC: Recent Labs  Lab 08/16/2022 0859 08/28/2022 1316 09/04/2022 1944  WBC 21.0*  --  11.5*  NEUTROABS  --   --  9.3*  HGB 8.1* 9.3* 7.8*  HCT 29.3* 30.4* 25.6*  MCV 88.5  --  89.8  PLT 241  --  143*   Cardiac Enzymes: No results for input(s): "CKTOTAL", "CKMB", "CKMBINDEX", "TROPONINI" in the last 168 hours. Sepsis Labs: Recent Labs  Lab 09/02/2022 0859 08/23/2022 1941 09/07/2022 1944  WBC 21.0*  --  11.5*  LATICACIDVEN  --  5.5*  --     Procedures/Operations  09/09/2022: EGD 08/27/2022: ETT placement 09/09/2022: CVC placement 09/07/2022: Emergent bedside EGD 08/19/2022: CPR   Eusevio Schriver L Rust-[place] 08/22/2022, 11:56 PM  Cheryll Cockayne [place], AGACNP-BC Acute Care Nurse Practitioner [facility]   [phone] / [phone] Please see Amion for pager details.

## 2022-09-14 NOTE — Death Summary Note (Incomplete)
DEATH SUMMARY   Patient Details  Name: Tristan Bender MRN: [id number] DOB: 25-Mar-1961  Admission/Discharge Information   Admit Date:  2022-09-30  Date of Death:    Time of Death:    Length of Stay: 0  Referring Physician: Sherron Monday, MD   Reason(s) for Hospitalization  Acute blood loss anemia s/t Acute upper GIB  Diagnoses  Preliminary cause of death: Upper Gastrointestinal bleeding Secondary Diagnoses (including complications and co-morbidities):  Principal Problem:   UGIB (upper gastrointestinal bleed) Active Problems:   Anemia   ESRD on hemodialysis (HCC)   HTN (hypertension)   Cardiomyopathy (HCC)   Hemorrhagic shock (HCC)   Cardiac arrest, cause unspecified (HCC)   Acute respiratory failure with hypoxia (HCC)   Aspiration into airway   Brief Hospital Course (including significant findings, care, treatment, and services provided and events leading to death)  Tristan Bender is a 62 y.o. year old male who was admitted with acute blood loss anemia in the setting of acute upper GI bleed.  ED Course: Initial Vital Signs: afebrile, blood pressure initially in the 60s to 80s. Received 2 units of emergency blood with blood pressures improving to the 80s to 90s with transfusion. Satting well on room air.  Significant Labs: Hemoglobin 8.1 with baseline hemoglobin around 10-11. Platelets 241, creatinine 9.1, lipase 90.  09/30/22: Admitted by hospitalist for acute GI bleed, GI consulted.  EGD performed which showed: LA Grade D (one or more mucosal breaks involving at least 75% of  esophageal circumference) esophagitis with no bleeding was found.  Evidence of a patent Billroth II gastrojejunostomy was found. The gastrojejunal anastomosis was characterized by ulceration and visible sutures. This was traversed. The efferent limb was examined. The afferent limb was examined. Localized granular mucosa was found at the anastomosis. Biopsies were taken with a cold forceps for histology.  Estimated blood loss was minimal. The examined jejunum was normal."  He returned to ICU post procedure Sep 30, 2022:  Became unresponsive with agonal respirations, aspirating large volume of hematemesis with large clots, along with hematochezia.  Emergently intubated, with Hemorrhagic shock, pt. Received 11 units of pRBC's, 3 FF, 1 cryo, 1 platelets.  Discussed with GI, STAT CT angiogram Abdomen obtained showing massive GI hemorrhage, SBO and pneumatosis. Urgently consulted GI, IR & general surgery, however the patient is not a surgical candidate. Therefore GI arrived bedside to perform an emergent EGD, but was unable to visualize any area in order to attempt homeostasis. Patient lost pulses briefly requiring 3 minutes of CPR/ACLS with ROSC. Family consulted by GI and PCCM with the ult     Pertinent Labs and Studies  Significant Diagnostic Studies CT Angio Abd/Pel w/ and/or w/o  Result Date: 09/30/22 CLINICAL DATA:  Lower GI hemorrhage EXAM: CTA ABDOMEN AND PELVIS WITHOUT AND WITH CONTRAST TECHNIQUE: Multidetector CT imaging of the abdomen and pelvis was performed using the standard protocol during bolus administration of intravenous contrast. Multiplanar reconstructed images and MIPs were obtained and reviewed to evaluate the vascular anatomy. RADIATION DOSE REDUCTION: This exam was performed according to the departmental dose-optimization program which includes automated exposure control, adjustment of the mA and/or kV according to patient size and/or use of iterative reconstruction technique. CONTRAST:  OMNIPAQUE IOHEXOL 350 MG/ML SOLN COMPARISON:  None Available. FINDINGS: VASCULAR Aorta: Abdominal aorta demonstrates atherosclerotic calcifications without aneurysmal dilatation or dissection. Celiac: Patent without evidence of aneurysm, dissection, vasculitis or significant stenosis. The initial arterial set was obtained very early in the arterial phase and distal branches of the  celiac axis are not  opacified. They show improved opacification on the more delayed arterial phase images. Heavy calcifications are noted. SMA: Calcifications are identified. The branches of the superior mesenteric artery are attenuated distally which may be related to underlying hypotension as well as inflammatory changes within the bowel. Renals: Renal arteries are patent proximally although distally somewhat diminutive in size also likely related to hypotension. IMA: Patent but diminutive in nature for similar reasons. Inflow: Iliacs demonstrate atherosclerotic calcification. No aneurysmal dilatation is noted. Veins: The timing of the imaging in the venous phase is somewhat limited. There are foci of air within the main portal vein with poor opacification of the portal venous branches. There are also attenuated likely again related to hypotension. Considerable portal venous air is noted within the liver. Review of the MIP images confirms the above findings. NON-VASCULAR Lower chest: Atelectatic changes are noted in the bases bilaterally. Hepatobiliary: The liver demonstrates significant geographic decreased attenuation likely related to the poor opacification and flow within the portal vein as well as the significant amount of portal venous gas. Pancreas: Pancreas shows a normal enhancement pattern. Spleen: Normal in size without focal abnormality. Adrenals/Urinary Tract: Adrenal glands demonstrate hyperdensity consistent with slow flow. The kidneys demonstrate multiple cystic lesions as well as decreased enhancement. The decreased enhancement is also likely related to poor perfusion related to the hypotension. The bladder is decompressed. Stomach/Bowel: The stomach demonstrates a large hiatal hernia. There is significant hyperdense material noted within the stomach and hiatal hernia with significant contrast extravasation into the lumen on delayed images consistent with acute GI hemorrhage. Additionally filling defects are noted  which are surrounded by the active hemorrhage likely related to thrombus. This corresponds with the patient's given clinical history. The focus of GI hemorrhage is somewhat difficult to assess although likely lies within the residual stomach just below the hiatal hernia. Extension of contrast into the proximal small bowel related to the gastric bypass is noted as well. Multiple dilated loops of small bowel are identified with evidence of pneumatosis. These findings correspond with significant amount of portal venous air. Colon appears within normal limits. Transition zone of the small bowel dilatation is not well appreciated due to the poor enhancement from hypotension but likely lies in the proximal jejunum likely related to Lymphatic: No significant lymphadenopathy is noted. Reproductive: Prostate is unremarkable. Other: No abdominal wall hernia or abnormality. No abdominopelvic ascites. Mild changes of anasarca are noted Musculoskeletal: No acute or significant osseous findings. IMPRESSION: VASCULAR Significant attenuation of arterial and venous branches related to severe hypotension. Findings consistent with acute GI hemorrhage within the stomach although the exact site of acute hemorrhage is difficult to assess due to the widespread contrast extravasation. Considerable filling defect is noted within the stomach and proximal small bowel likely related to thrombus. Difficult portal venous air with geographic enhancement normality is in the liver likely related to poor flow in the degree of significant hypotension. NON-VASCULAR Findings consistent with small-bowel obstruction with evidence of pneumatosis and portal venous air. The exact transition zone is difficult to assess but likely lies in the proximal ileum. Hiatal hernia. Significant GI hemorrhage within the stomach and extending into the small bowel as described. Critical Value/emergent results were called by telephone at the time of interpretation on  08/20/2022 at 9:07 pm to Cheryll Cockayne NP, who verbally acknowledged these results. Electronically Signed   By: Alcide Clever M.D.   On: 09/05/2022 21:13   CT HEAD WO CONTRAST ( )  Result Date: 09/01/2022  CLINICAL DATA:  Altered mental status EXAM: CT HEAD WITHOUT CONTRAST TECHNIQUE: Contiguous axial images were obtained from the base of the skull through the vertex without intravenous contrast. RADIATION DOSE REDUCTION: This exam was performed according to the departmental dose-optimization program which includes automated exposure control, adjustment of the mA and/or kV according to patient size and/or use of iterative reconstruction technique. COMPARISON:  None Available. FINDINGS: Brain: There is no mass, hemorrhage or extra-axial collection. The size and configuration of the ventricles and extra-axial CSF spaces are normal. The brain parenchyma is normal, without acute or chronic infarction. Vascular: No abnormal hyperdensity of the major intracranial arteries or dural venous sinuses. No intracranial atherosclerosis. Skull: The visualized skull base, calvarium and extracranial soft tissues are normal. Sinuses/Orbits: No fluid levels or advanced mucosal thickening of the visualized paranasal sinuses. No mastoid or middle ear effusion. The orbits are normal. IMPRESSION: Normal head CT. Electronically Signed   By: Deatra Robinson M.D.   On: 09/08/2022 20:49   DG Chest 1 View  Result Date: 09/02/2022 CLINICAL DATA:  Intubation and central line placement EXAM: CHEST  1 VIEW COMPARISON:  Radiograph 08/28/2022 at 6:53 p.m. FINDINGS: Enteric tube tip in the intrathoracic trachea 4.4 cm from the carina. New left IJ CVC tip in the mid SVC. Stable cardiomediastinal silhouette. Aortic atherosclerotic calcification. Left basilar and upper lung scarring. No focal consolidation, pleural effusion, or pneumothorax. IMPRESSION: 1. Enteric tube tip in the intrathoracic trachea 4.4 cm from the carina. 2. New left IJ CVC tip in  the mid SVC. Electronically Signed   By: Minerva Fester M.D.   On: 08/21/2022 19:47   DG Chest Port 1 View  Result Date: 08/15/2022 CLINICAL DATA:  Check endotracheal tube placement EXAM: PORTABLE CHEST 1 VIEW COMPARISON:  Film from earlier in the same day. FINDINGS: Cardiac shadows within normal limits. Aortic calcifications are noted. Endotracheal tube is noted in satisfactory position. Mild scarring is noted in the left base. No bony abnormality is noted. IMPRESSION: Endotracheal tube in satisfactory position. Remainder of the exam is unchanged. Electronically Signed   By: Alcide Clever M.D.   On: 08/15/2022 19:16   DG Chest Portable 1 View  Result Date: 09/12/2022 CLINICAL DATA:  Emesis EXAM: PORTABLE CHEST 1 VIEW COMPARISON:  CXR 08/12/20 FINDINGS: No pleural effusion. No pneumothorax. No focal airspace opacity. No radiographically apparent displaced rib fractures. Normal cardiac and mediastinal contours. Visualized upper abdomen is unremarkable. IMPRESSION: No focal airspace opacity. Electronically Signed   By: Lorenza Cambridge M.D.   On: 08/30/2022 09:52    Microbiology Recent Results (from the past 240 hour(s))  MRSA Next Gen by PCR, Nasal     Status: None   Collection Time: 09/04/2022 12:20 PM   Specimen: Nasal Mucosa; Nasal Swab  Result Value Ref Range Status   MRSA by PCR Next Gen NOT DETECTED NOT DETECTED Final    Comment: (NOTE) The GeneXpert MRSA Assay (FDA approved for NASAL specimens only), is one component of a comprehensive MRSA colonization surveillance program. It is not intended to diagnose MRSA infection nor to guide or monitor treatment for MRSA infections. Test performance is not FDA approved in patients less than 15 years old. Performed at [facility], [address] [place], [place] [address]     Lab Basic Metabolic Panel: Recent Labs  Lab 09/10/2022 0859 09/05/2022 1944  NA 138 135  135  K 4.1 6.5*  6.5*  CL 98 104  104  CO2 24 14*  17*  GLUCOSE  137* 312*  316*  BUN 48* 48*  50*  CREATININE 8.17* 6.97*  6.80*  CALCIUM 6.7* 5.1*  5.1*  MG 1.7  --   PHOS 6.2* 7.6*   Liver Function Tests: Recent Labs  Lab 09/01/2022 0859 09/12/2022 1944  AST 24  --   ALT 10  --   ALKPHOS 68  --   BILITOT 0.9  --   PROT 6.5  --   ALBUMIN 2.5* <1.5*   Recent Labs  Lab 08/22/2022 0859  LIPASE 90*   No results for input(s): "AMMONIA" in the last 168 hours. CBC: Recent Labs  Lab 08/25/2022 0859 08/24/2022 1316 08/17/2022 1944  WBC 21.0*  --  11.5*  NEUTROABS  --   --  9.3*  HGB 8.1* 9.3* 7.8*  HCT 29.3* 30.4* 25.6*  MCV 88.5  --  89.8  PLT 241  --  143*   Cardiac Enzymes: No results for input(s): "CKTOTAL", "CKMB", "CKMBINDEX", "TROPONINI" in the last 168 hours. Sepsis Labs: Recent Labs  Lab 08/19/2022 0859 09/12/2022 1941 09/03/2022 1944  WBC 21.0*  --  11.5*  LATICACIDVEN  --  5.5*  --     Procedures/Operations  ***   Danyale Ridinger L [place] 09/12/2022, 11:56 PM

## 2022-09-14 DEATH — deceased

## 2022-09-15 LAB — HEPATITIS B SURFACE ANTIBODY, QUANTITATIVE: Hep B S AB Quant (Post): 40.9 m[IU]/mL (ref >9.9)

## 2022-09-15 LAB — SURGICAL PATHOLOGY

## 2022-09-20 LAB — PREPARE PLATELET PHERESIS

## 2022-09-20 LAB — BPAM PLATELET PHERESIS
ISSUE DATE / TIME: 202404302002
Unit Type and Rh: 600

## 2022-10-15 NOTE — Anesthesia Postprocedure Evaluation (Signed)
Anesthesia Post Note  Patient: Tristan Bender  Procedure(s) Performed: ESOPHAGOGASTRODUODENOSCOPY (EGD)  Anesthesia Type: General Post-procedure mental status: deceased. Cardiovascular status: stable Anesthetic complications: no Comments: Patient deceased, apparent massive hematemesis and aspiration last night. No apparent anesthetic complications.   No notable events documented.   Last Vitals:  Vitals:   08/22/2022 2329 08/22/2022 2343  BP:    Pulse:    Resp: 12 (!) 0  Temp:    SpO2:      Last Pain:  Vitals:   08/25/2022 1700  TempSrc:   PainSc: 0-No pain                 Corinda Gubler

## 2022-10-15 NOTE — Discharge Summary (Signed)
Patient died on 10/06/2022 23:43

## 2022-10-15 NOTE — Progress Notes (Addendum)
1900 patient being intubated and central line being placed on arrival to take over care patient hypotensive with massive bleeding from rectum mouth and nose massive blood clots blood and platelets started emergent after sending labs. Patient taken to CT with a team of critical care staff pressors all at max. After CT more blood products given Gi scope at bedside unsuccessful massive bleeding with massive clots. Patient then went into Vtach shock was given and then pulse was lost shortly after CPR started family decided to stop care and make patient comfort care after regaining a pulse. Patient extubated and given morphine for agonal breathing. 1143 patient pulseless.  11 units blood 2 platelets one FFP and one cro given to patient

## 2022-10-15 DEATH — deceased

## 2023-02-21 IMAGING — CT CT ABD-PELV W/O CM
2 of 4 series · 16 of 46 positions shown, 18 images · non-contrast
Comparison: 08/12/2020

CLINICAL DATA: Abdominal pain for 5 days, recent cholecystectomy

EXAM:
CT ABDOMEN AND PELVIS WITHOUT CONTRAST
TECHNIQUE: Multidetector CT imaging of the abdomen and pelvis was performed
following the standard protocol without IV contrast.

[Series 2: routine abd/pel wo · axial · 0.85mm/px · z∈[-657,-217]mm · 13 of 96 slices shown, 15 images]
[im 4/96  soft-tissue]
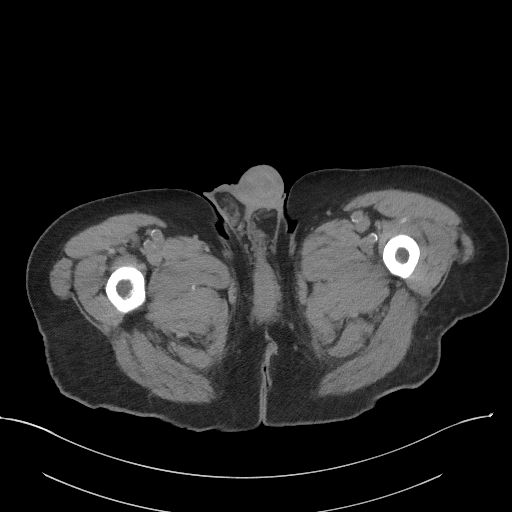
[im 4/96  bone]
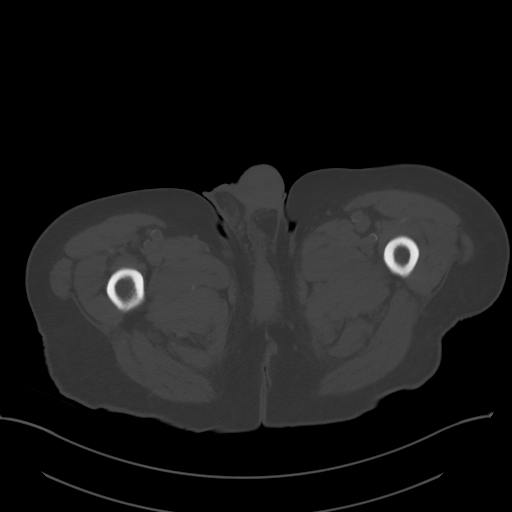
[im 12/96  soft-tissue]
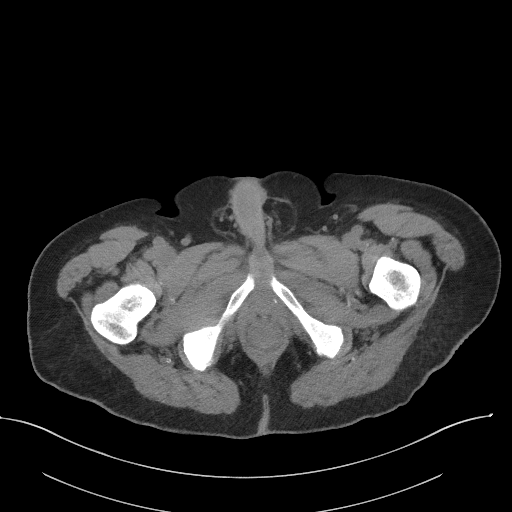
[im 20/96  soft-tissue]
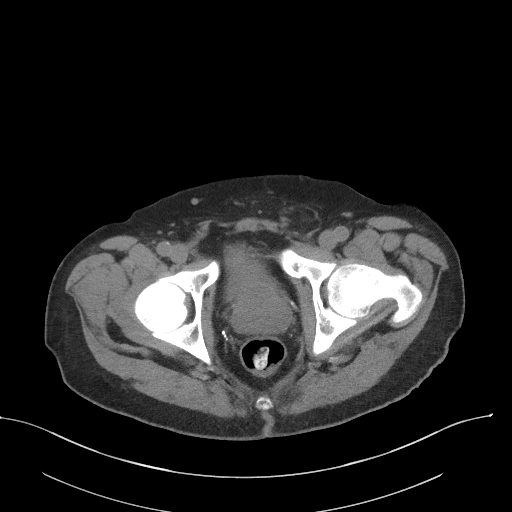
[im 28/96  soft-tissue]
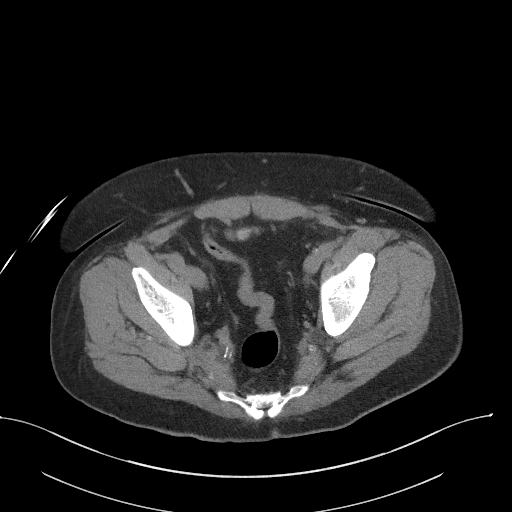
[im 32/96  soft-tissue]
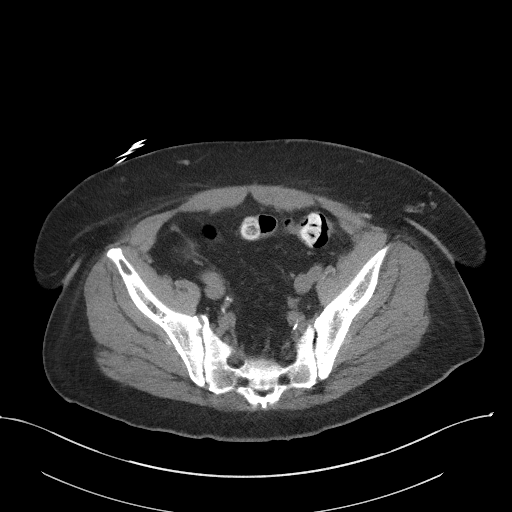
[im 40/96  soft-tissue]
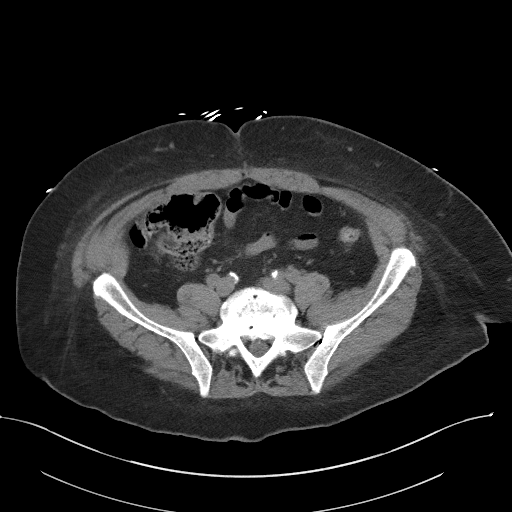
[im 48/96  soft-tissue]
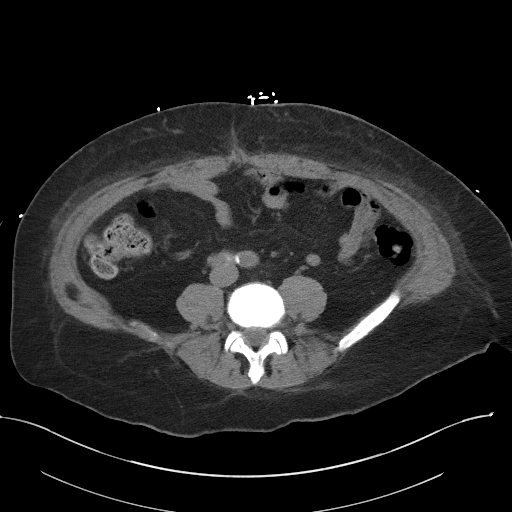
[im 56/96  soft-tissue]
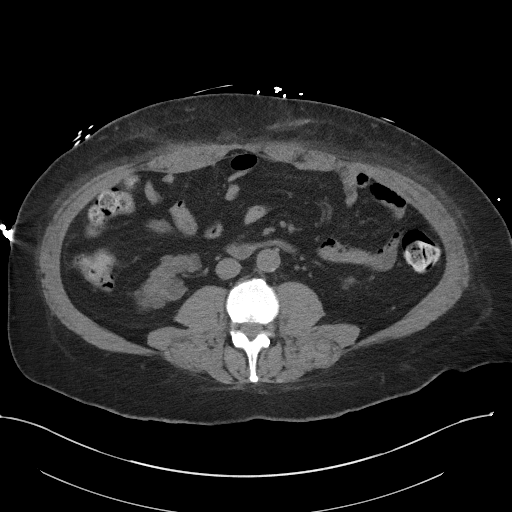
[im 64/96  soft-tissue]
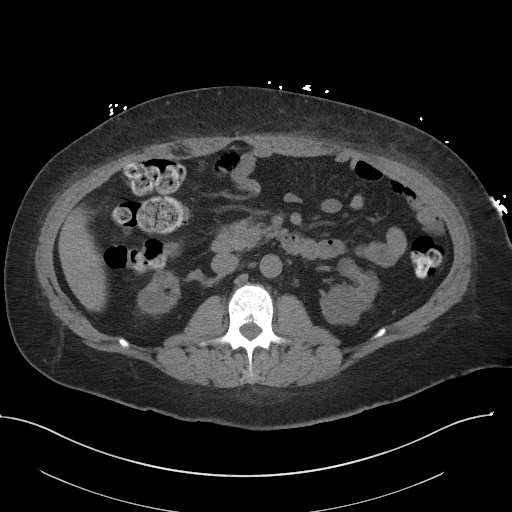
[im 64/96  bone]
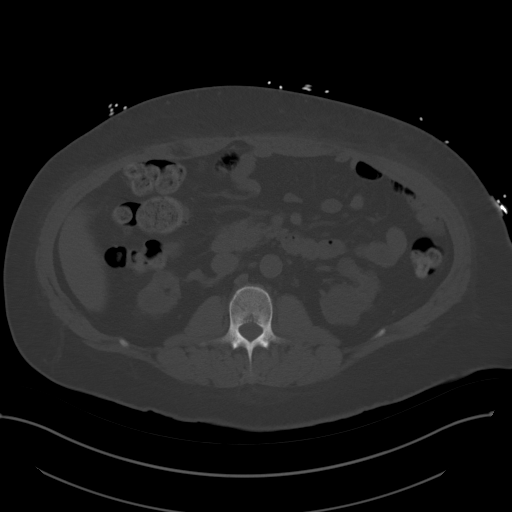
[im 68/96  soft-tissue]
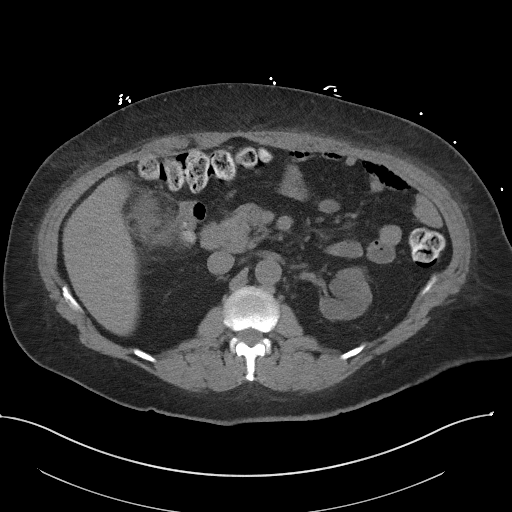
[im 76/96  soft-tissue]
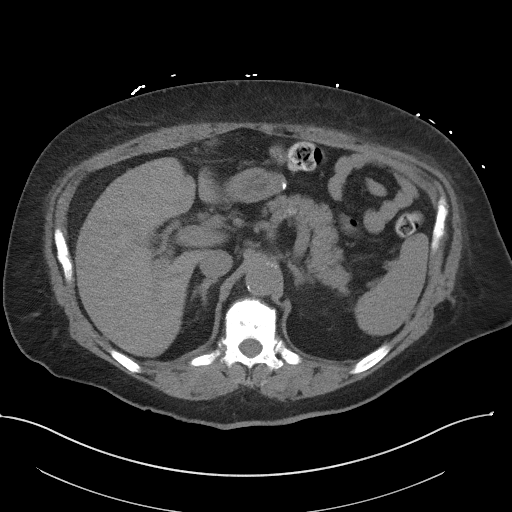
[im 84/96  soft-tissue]
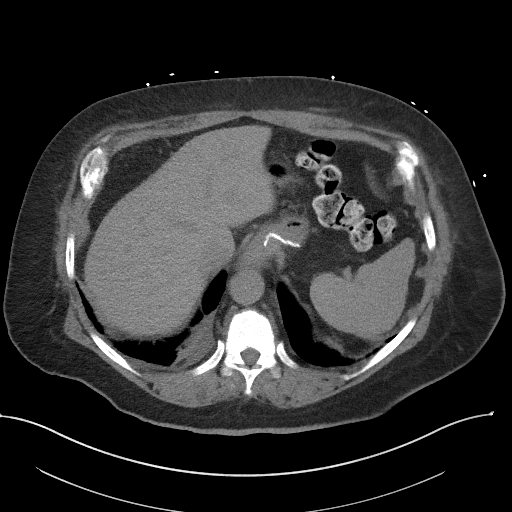
[im 92/96  soft-tissue]
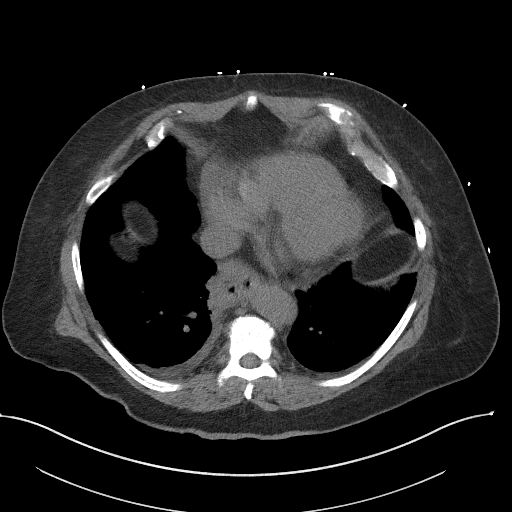

[Series 5: coronal st · coronal · 0.89mm/px · 3 of 101 slices shown]
[im 34/101  soft-tissue]
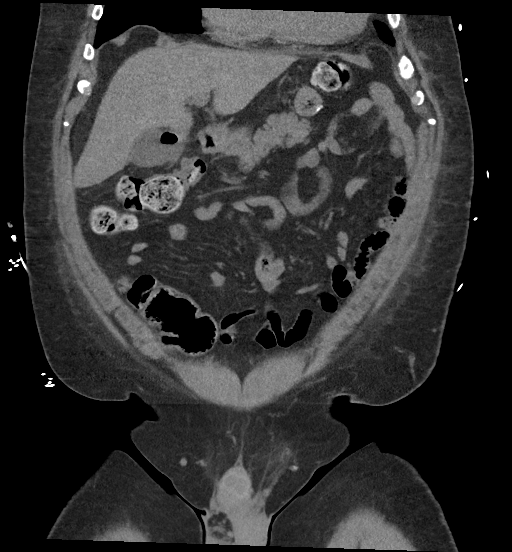
[im 45/101  soft-tissue]
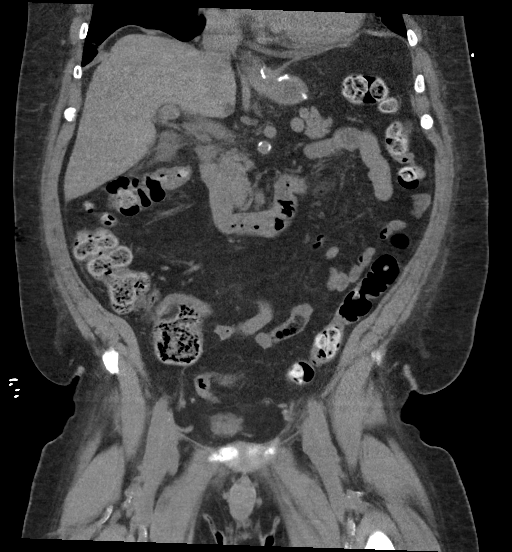
[im 56/101  soft-tissue]
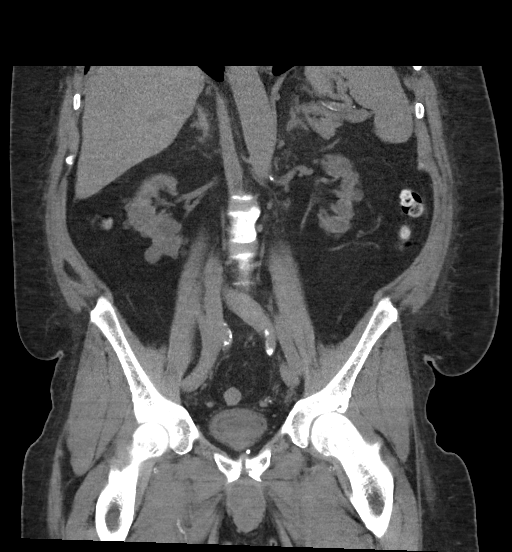

[16 of 46 positions shown; findings below may reference images not displayed]

FINDINGS: Lower chest: Trace bilateral pleural effusions. No acute airspace
disease. Moderate hiatal hernia.

Hepatobiliary: Postsurgical changes are seen from cholecystectomy,
with 5.7 x 4.2 cm gas and fluid collection within the gallbladder
fossa. Characterization is limited without IV contrast.

Unenhanced imaging of the liver is unremarkable without biliary
dilation.

Pancreas: Unremarkable. No pancreatic ductal dilatation or
surrounding inflammatory changes.

Spleen: Normal in size without focal abnormality.

Adrenals/Urinary Tract: Numerous there is bilateral renal atrophy,
with cortical cysts. No urinary tract calculi or obstruction.
Bladder is decompressed, limiting its evaluation. The adrenals are
unremarkable.

Stomach/Bowel: No bowel obstruction or ileus. No bowel wall
thickening or inflammatory change. Postsurgical changes from
previous bariatric surgery.

Vascular/Lymphatic: Aortic atherosclerosis. No enlarged abdominal or
pelvic lymph nodes.

Reproductive: Prostate is unremarkable.

Other: No free fluid or free gas. Fat containing left inguinal
hernia. No bowel herniation.

Musculoskeletal: No acute or destructive bony lesions. Reconstructed
images demonstrate no additional findings.
IMPRESSION: 1. Postsurgical changes from cholecystectomy, with 5.7 x 4.2 cm gas
and fluid collection within the gallbladder fossa. Evaluation is
limited without IV contrast, this could reflect postoperative
hematoma or seroma, abscess, or biloma.
2. Trace bilateral pleural effusions.
3. Bilateral renal cortical atrophy and numerous renal cortical
cysts, consistent with end-stage renal disease.
4. Fat containing left inguinal hernia.
5.  Aortic Atherosclerosis ([id number]).

## 2023-02-22 IMAGING — NM NM HEPATOBILIARY SCAN
1 series · 6 of 6 positions shown · non-contrast
Comparison: CT abdomen 08/26/2020

CLINICAL DATA: Chronic upper abdominal pain, cholecystectomy on
08/21/2020, query bile leak

EXAM:
NUCLEAR MEDICINE HEPATOBILIARY IMAGING
TECHNIQUE: Sequential images of the abdomen were obtained [DATE] minutes
following intravenous administration of radiopharmaceutical.
RADIOPHARMACEUTICALS:  5.1 mCi 1c-BBm  Choletec IV

[Series 1000: hepatobiliary scan · 9.59mm/px · 6 of 60 frames shown]
[frame 6/60]
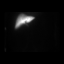
[frame 16/60]
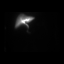
[frame 26/60]
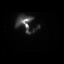
[frame 36/60]
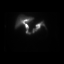
[frame 46/60]
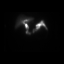
[frame 56/60]
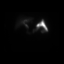

[6 of 6 positions shown; findings below may reference images not displayed]

FINDINGS: No bile leak is identified. Although there is some notable activity
along the inferior margin of the left hepatic lobe on earlier
images, this substantially diminishes on later images and thus is
ascribed to being activity in the left hepatic duct rather than
leaked activity, and no activity extends down along the right
paracolic gutter or in typical collection locations. There is
satisfactory uptake of radiopharmaceutical from the blood pool, with
biliary activity visible at 7 minutes and bowel activity visible at
18 minutes.
IMPRESSION: 1. No bile leak is identified. Satisfactory hepatocellular uptake of
radiopharmaceutical and expected patency of the common bile duct.

## 2023-12-26 IMAGING — CT CT ABD-PELV W/O CM
2 of 4 series · 16 of 46 positions shown, 18 images · non-contrast
Comparison: Noncontrast CT Abdomen and Pelvis 08/26/2020.

CLINICAL DATA: 60-year-old male with abdominal pain and vomiting.
History of cholecystectomy, gastric sleeve. Status post EGD last
year 09/07/2020.



[Series 2: routine abd/pel wo · axial · 0.91mm/px · z∈[-640,-215]mm · 13 of 95 slices shown, 15 images]
[im 5/95  soft-tissue]
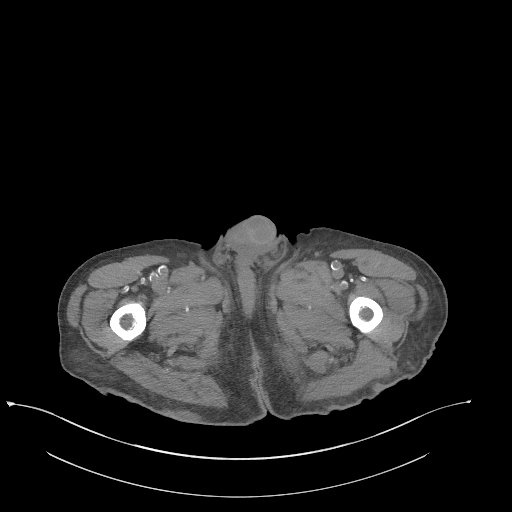
[im 5/95  bone]
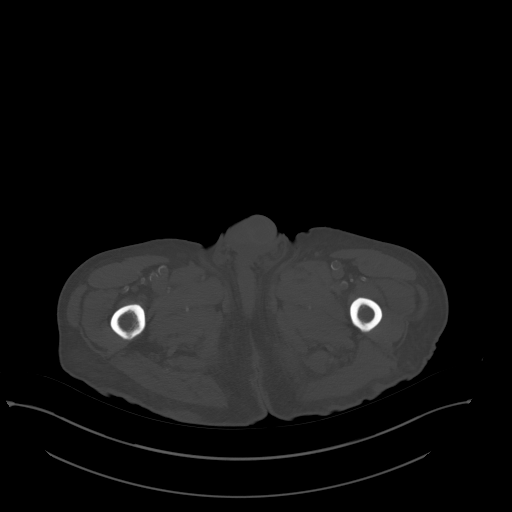
[im 13/95  soft-tissue]
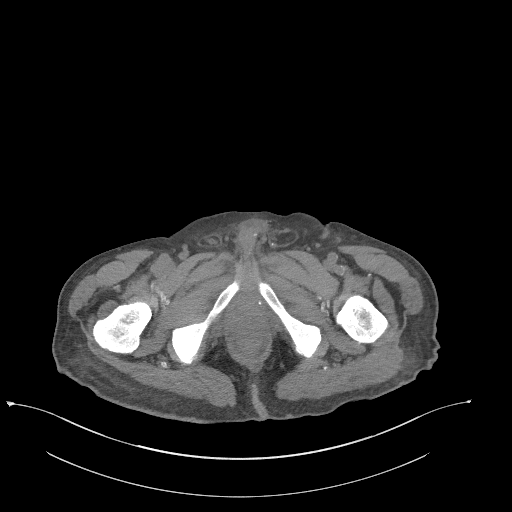
[im 22/95  soft-tissue]
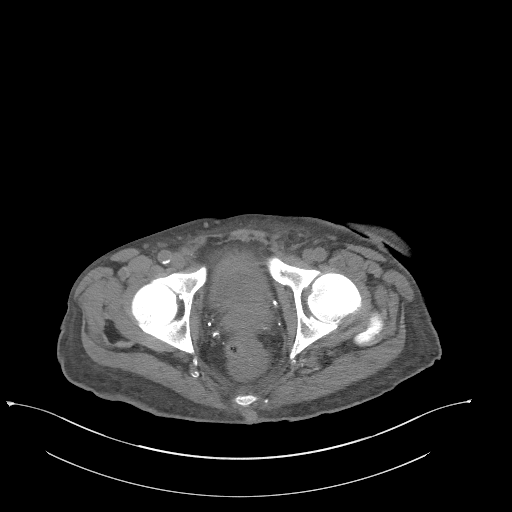
[im 26/95  soft-tissue]
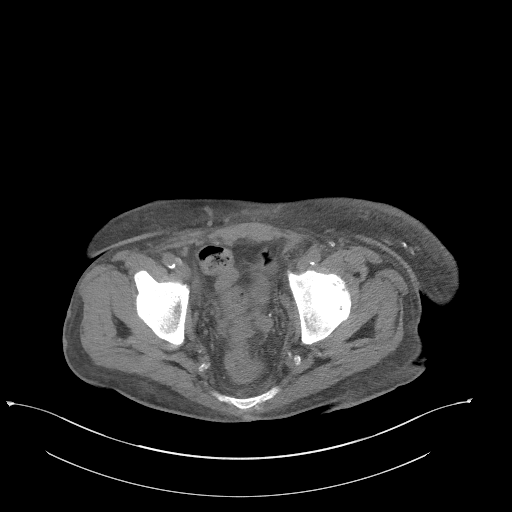
[im 35/95  soft-tissue]
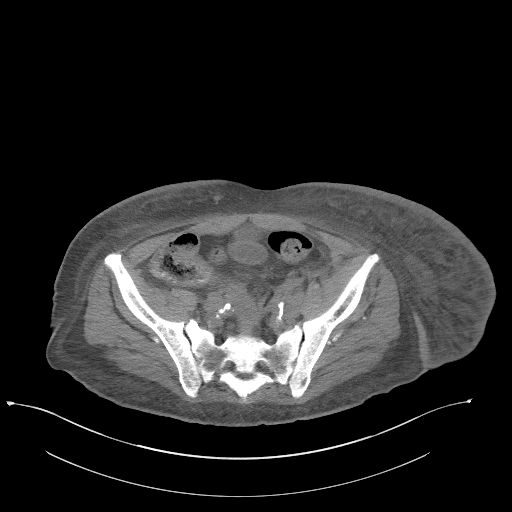
[im 39/95  soft-tissue]
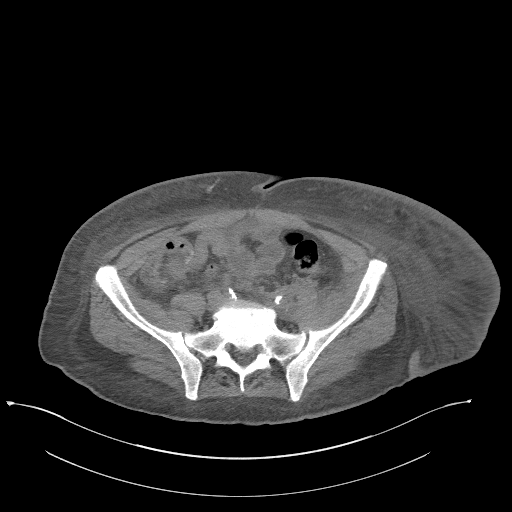
[im 48/95  soft-tissue]
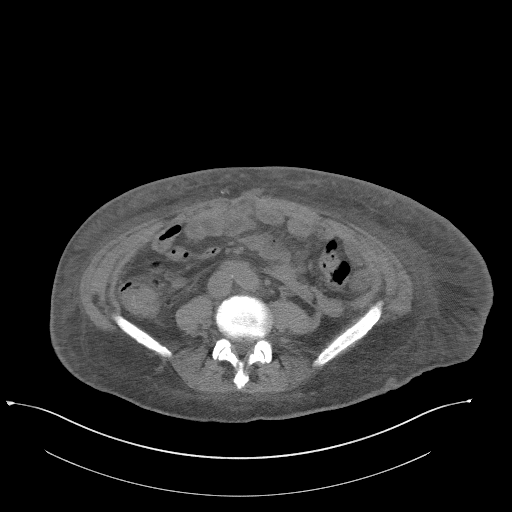
[im 56/95  soft-tissue]
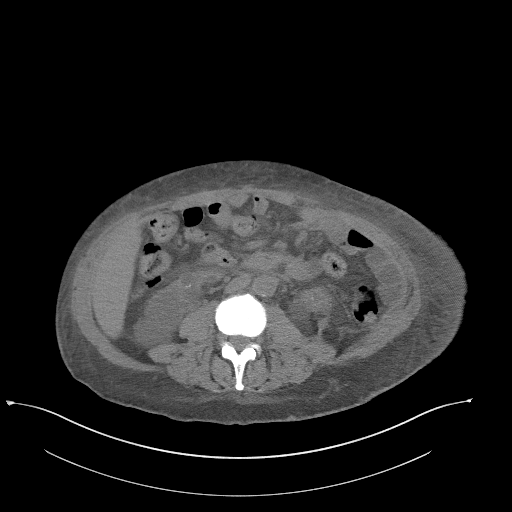
[im 60/95  soft-tissue]
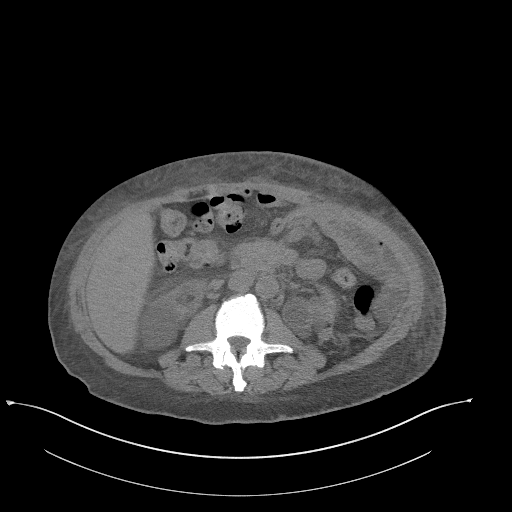
[im 60/95  bone]
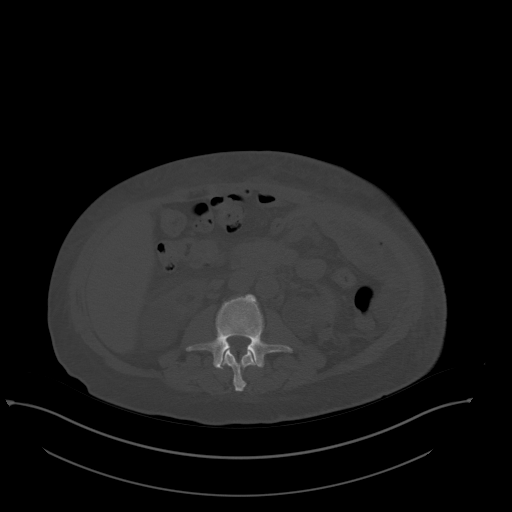
[im 69/95  soft-tissue]
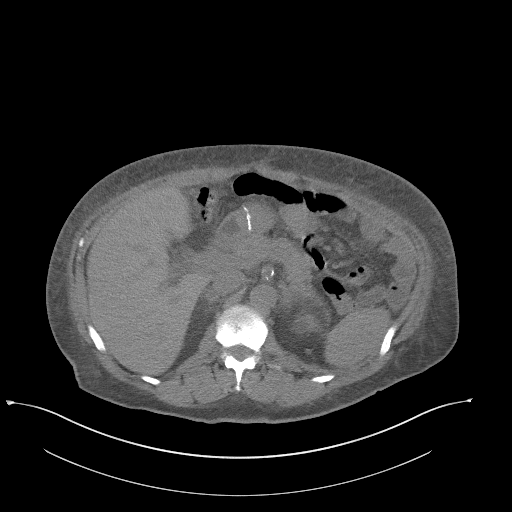
[im 73/95  soft-tissue]
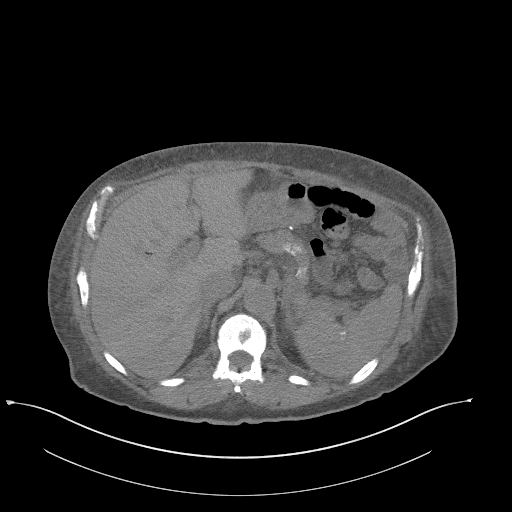
[im 82/95  soft-tissue]
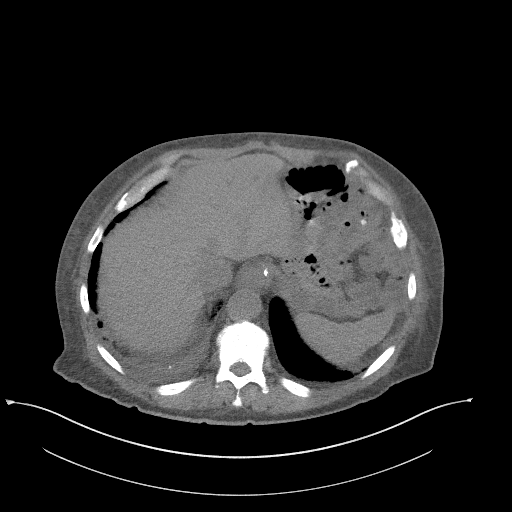
[im 90/95  soft-tissue]
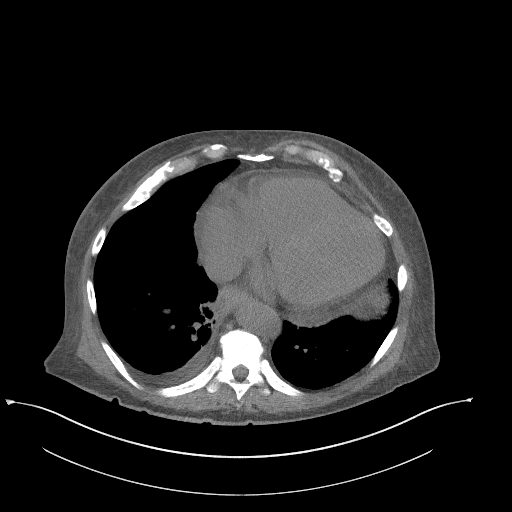

[Series 5: coronal st · coronal · 0.92mm/px · 3 of 90 slices shown]
[im 30/90  soft-tissue]
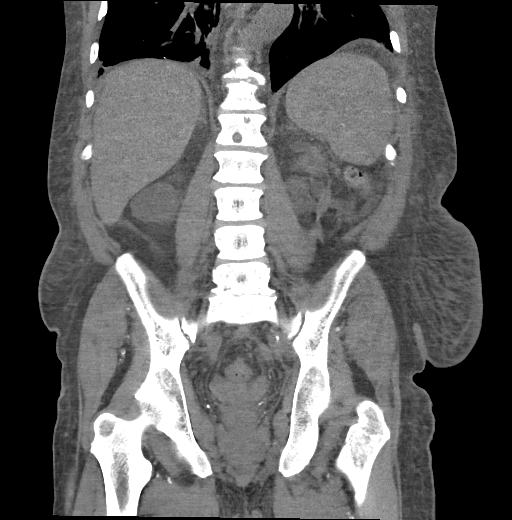
[im 40/90  soft-tissue]
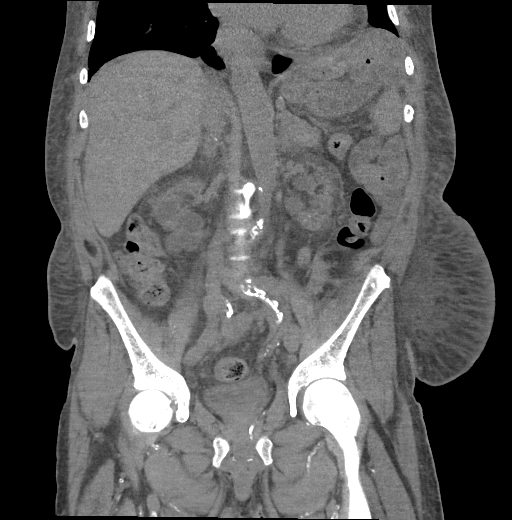
[im 50/90  soft-tissue]
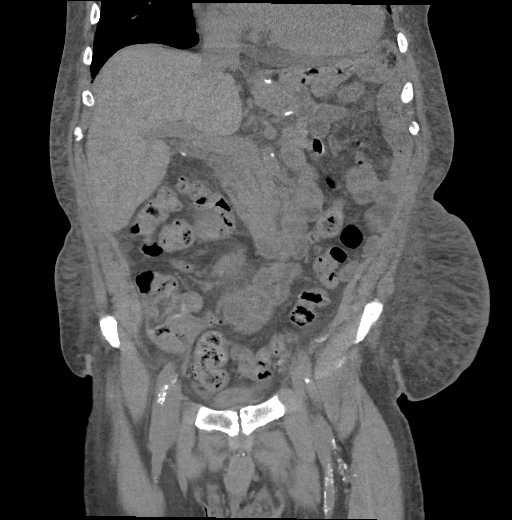

[16 of 46 positions shown; findings below may reference images not displayed]

FINDINGS: Lower chest: Cardiomegaly appears substantially progressed since
last year. There is a small pericardial effusion. Superimposed small
right and trace left layering pleural fluid is stable from last
year. Mild lung base atelectasis or scarring. No lung base
pneumonia.

Hepatobiliary: Absent gallbladder with small volume pneumobilia now.
No convincing portal venous gas. Otherwise negative noncontrast
liver.

Pancreas: Negative.

Spleen: Negative.

Adrenals/Urinary Tract: Chronic multi-cystic renal atrophy does not
appear significantly changed from last year. Mild adrenal gland
thickening is new. Decompressed bladder. Indistinct bladder wall
similar to last year.

Stomach/Bowel: No dilated large or small bowel. Generalized
mesenteric congestion is new since last year and concordant with
similar generalized body wall edema now. Evidence of normal gas
containing appendix on coronal image 42. No discrete large or small
bowel inflammation is evident. Stable sequelae of gastric sleeve. No
free air or free fluid identified.

Vascular/Lymphatic: Diffuse severe calcified atherosclerosis. Normal
caliber abdominal aorta. Vascular patency is not evaluated in the
absence of IV contrast. No lymphadenopathy identified.

Reproductive: Small chronic fat containing left inguinal hernia.

Other: No pelvic free fluid.

Musculoskeletal: Renal osteodystrophy suspected, with chronic
increased generalized bony sclerosis. Circumscribed lucent areas in
the L1 vertebral body are most compatible with benign Schmorl's
nodes. Elsewhere Stable visualized osseous structures. Mild to
moderate generalized body wall edema is new from last year.
IMPRESSION: 1. Cardiomegaly, substantially increased since last year and with a
new small pericardial effusion.

2. Associated new Anasarca, with generalized body wall and
mesenteric edema. But small right > left pleural effusions have not
significantly changed from last year.

3. No evidence of bowel obstruction. No discrete bowel inflammation
on this noncontrast exam.

4. Small amount of pneumobilia suspected following cholecystectomy.

5. Chronic renal atrophy. Renal osteodystrophy. Aortic
Atherosclerosis ([id number]).
